# Patient Record
Sex: Female | Born: 1945 | Race: White | Hispanic: No | Smoking: Former smoker
Health system: Southern US, Community
[De-identification: ages and names within clinical notes are randomized; demographics above are authoritative.]

## PROBLEM LIST (undated history)

## (undated) DIAGNOSIS — M1990 Unspecified osteoarthritis, unspecified site: Secondary | ICD-10-CM

## (undated) DIAGNOSIS — Z972 Presence of dental prosthetic device (complete) (partial): Secondary | ICD-10-CM

## (undated) DIAGNOSIS — I499 Cardiac arrhythmia, unspecified: Secondary | ICD-10-CM

## (undated) DIAGNOSIS — K589 Irritable bowel syndrome without diarrhea: Secondary | ICD-10-CM

## (undated) DIAGNOSIS — K2950 Unspecified chronic gastritis without bleeding: Secondary | ICD-10-CM

## (undated) DIAGNOSIS — I739 Peripheral vascular disease, unspecified: Secondary | ICD-10-CM

## (undated) DIAGNOSIS — I679 Cerebrovascular disease, unspecified: Secondary | ICD-10-CM

## (undated) DIAGNOSIS — F419 Anxiety disorder, unspecified: Secondary | ICD-10-CM

## (undated) DIAGNOSIS — I10 Essential (primary) hypertension: Secondary | ICD-10-CM

## (undated) DIAGNOSIS — K08109 Complete loss of teeth, unspecified cause, unspecified class: Secondary | ICD-10-CM

## (undated) DIAGNOSIS — Z8679 Personal history of other diseases of the circulatory system: Secondary | ICD-10-CM

## (undated) DIAGNOSIS — K625 Hemorrhage of anus and rectum: Secondary | ICD-10-CM

## (undated) DIAGNOSIS — I700 Atherosclerosis of aorta: Secondary | ICD-10-CM

## (undated) DIAGNOSIS — R42 Dizziness and giddiness: Secondary | ICD-10-CM

## (undated) DIAGNOSIS — K642 Third degree hemorrhoids: Secondary | ICD-10-CM

## (undated) DIAGNOSIS — Z9289 Personal history of other medical treatment: Secondary | ICD-10-CM

## (undated) DIAGNOSIS — I708 Atherosclerosis of other arteries: Secondary | ICD-10-CM

## (undated) DIAGNOSIS — Z8719 Personal history of other diseases of the digestive system: Secondary | ICD-10-CM

## (undated) DIAGNOSIS — Z8673 Personal history of transient ischemic attack (TIA), and cerebral infarction without residual deficits: Secondary | ICD-10-CM

## (undated) DIAGNOSIS — K449 Diaphragmatic hernia without obstruction or gangrene: Secondary | ICD-10-CM

## (undated) DIAGNOSIS — R233 Spontaneous ecchymoses: Secondary | ICD-10-CM

## (undated) DIAGNOSIS — E785 Hyperlipidemia, unspecified: Secondary | ICD-10-CM

## (undated) DIAGNOSIS — K222 Esophageal obstruction: Secondary | ICD-10-CM

## (undated) DIAGNOSIS — K219 Gastro-esophageal reflux disease without esophagitis: Secondary | ICD-10-CM

## (undated) DIAGNOSIS — I6523 Occlusion and stenosis of bilateral carotid arteries: Secondary | ICD-10-CM

## (undated) DIAGNOSIS — Z973 Presence of spectacles and contact lenses: Secondary | ICD-10-CM

## (undated) DIAGNOSIS — E7800 Pure hypercholesterolemia, unspecified: Secondary | ICD-10-CM

## (undated) DIAGNOSIS — J189 Pneumonia, unspecified organism: Secondary | ICD-10-CM

## (undated) HISTORY — DX: Pure hypercholesterolemia, unspecified: E78.00

## (undated) HISTORY — DX: Essential (primary) hypertension: I10

## (undated) HISTORY — DX: Cardiac arrhythmia, unspecified: I49.9

## (undated) HISTORY — PX: TONSILLECTOMY: SUR1361

## (undated) HISTORY — DX: Spontaneous ecchymoses: R23.3

## (undated) HISTORY — DX: Unspecified chronic gastritis without bleeding: K29.50

## (undated) HISTORY — DX: Esophageal obstruction: K22.2

## (undated) HISTORY — DX: Irritable bowel syndrome, unspecified: K58.9

## (undated) HISTORY — DX: Anxiety disorder, unspecified: F41.9

## (undated) HISTORY — DX: Dizziness and giddiness: R42

## (undated) HISTORY — DX: Hyperlipidemia, unspecified: E78.5

## (undated) HISTORY — DX: Pneumonia, unspecified organism: J18.9

## (undated) HISTORY — PX: VASCULAR SURGERY: SHX849

## (undated) HISTORY — PX: CAROTID ENDARTERECTOMY: SUR193

## (undated) HISTORY — DX: Diaphragmatic hernia without obstruction or gangrene: K44.9

## (undated) HISTORY — PX: WRIST GANGLION EXCISION: SUR520

## (undated) HISTORY — DX: Gastro-esophageal reflux disease without esophagitis: K21.9

## (undated) HISTORY — PX: NASAL SINUS SURGERY: SHX719

---

## 2001-08-12 ENCOUNTER — Ambulatory Visit (HOSPITAL_COMMUNITY): Admission: RE | Admit: 2001-08-12 | Discharge: 2001-08-12 | Payer: Self-pay | Admitting: Gastroenterology

## 2001-08-12 ENCOUNTER — Encounter: Payer: Self-pay | Admitting: Gastroenterology

## 2004-06-18 ENCOUNTER — Ambulatory Visit: Payer: Self-pay | Admitting: Cardiovascular Disease

## 2004-06-18 ENCOUNTER — Ambulatory Visit: Payer: Self-pay

## 2004-07-04 ENCOUNTER — Ambulatory Visit: Payer: Self-pay | Admitting: Cardiovascular Disease

## 2004-11-26 ENCOUNTER — Ambulatory Visit: Payer: Self-pay

## 2004-12-08 ENCOUNTER — Ambulatory Visit: Payer: Self-pay | Admitting: Pulmonary Disease

## 2004-12-12 ENCOUNTER — Ambulatory Visit: Payer: Self-pay

## 2004-12-12 ENCOUNTER — Ambulatory Visit: Payer: Self-pay | Admitting: Cardiovascular Disease

## 2004-12-12 ENCOUNTER — Encounter: Payer: Self-pay | Admitting: Cardiovascular Disease

## 2006-01-21 ENCOUNTER — Ambulatory Visit: Payer: Self-pay | Admitting: Pulmonary Disease

## 2006-04-25 DIAGNOSIS — Z8673 Personal history of transient ischemic attack (TIA), and cerebral infarction without residual deficits: Secondary | ICD-10-CM

## 2006-04-25 HISTORY — DX: Personal history of transient ischemic attack (TIA), and cerebral infarction without residual deficits: Z86.73

## 2006-04-26 ENCOUNTER — Inpatient Hospital Stay (HOSPITAL_COMMUNITY): Admission: EM | Admit: 2006-04-26 | Discharge: 2006-04-30 | Payer: Self-pay | Admitting: Emergency Medicine

## 2006-04-26 ENCOUNTER — Encounter (INDEPENDENT_AMBULATORY_CARE_PROVIDER_SITE_OTHER): Payer: Self-pay | Admitting: *Deleted

## 2006-04-26 ENCOUNTER — Ambulatory Visit: Payer: Self-pay | Admitting: Internal Medicine

## 2006-04-26 ENCOUNTER — Encounter: Payer: Self-pay | Admitting: Internal Medicine

## 2006-04-27 ENCOUNTER — Ambulatory Visit: Payer: Self-pay | Admitting: Vascular Surgery

## 2006-04-28 ENCOUNTER — Encounter: Payer: Self-pay | Admitting: Cardiology

## 2006-04-29 ENCOUNTER — Encounter (INDEPENDENT_AMBULATORY_CARE_PROVIDER_SITE_OTHER): Payer: Self-pay | Admitting: Specialist

## 2006-05-26 ENCOUNTER — Ambulatory Visit: Payer: Self-pay | Admitting: Vascular Surgery

## 2006-05-27 ENCOUNTER — Ambulatory Visit: Payer: Self-pay | Admitting: Cardiovascular Disease

## 2006-06-03 ENCOUNTER — Ambulatory Visit: Payer: Self-pay | Admitting: Vascular Surgery

## 2006-06-16 ENCOUNTER — Ambulatory Visit: Payer: Self-pay | Admitting: Internal Medicine

## 2006-06-16 ENCOUNTER — Encounter: Payer: Self-pay | Admitting: Internal Medicine

## 2006-06-16 DIAGNOSIS — I10 Essential (primary) hypertension: Secondary | ICD-10-CM | POA: Insufficient documentation

## 2006-06-16 DIAGNOSIS — R233 Spontaneous ecchymoses: Secondary | ICD-10-CM

## 2006-06-16 DIAGNOSIS — E785 Hyperlipidemia, unspecified: Secondary | ICD-10-CM | POA: Insufficient documentation

## 2006-06-16 DIAGNOSIS — I679 Cerebrovascular disease, unspecified: Secondary | ICD-10-CM | POA: Insufficient documentation

## 2006-06-16 DIAGNOSIS — K219 Gastro-esophageal reflux disease without esophagitis: Secondary | ICD-10-CM | POA: Insufficient documentation

## 2006-06-16 HISTORY — DX: Spontaneous ecchymoses: R23.3

## 2006-06-16 LAB — CONVERTED CEMR LAB
ALT: 22 units/L (ref 0–40)
AST: 24 units/L (ref 0–37)
Albumin: 4.3 g/dL (ref 3.5–5.2)
Alkaline Phosphatase: 116 units/L (ref 39–117)
BUN: 7 mg/dL (ref 6–23)
Basophils Absolute: 0 10*3/uL (ref 0.0–0.1)
Basophils Relative: 0.7 % (ref 0.0–1.0)
Bilirubin, Direct: 0.1 mg/dL (ref 0.0–0.3)
CO2: 31 meq/L (ref 19–32)
Calcium: 9.6 mg/dL (ref 8.4–10.5)
Chloride: 106 meq/L (ref 96–112)
Cholesterol: 251 mg/dL (ref 0–200)
Creatinine, Ser: 0.7 mg/dL (ref 0.4–1.2)
Direct LDL: 154.2 mg/dL
Eosinophils Absolute: 0.1 10*3/uL (ref 0.0–0.6)
Eosinophils Relative: 1.6 % (ref 0.0–5.0)
GFR calc Af Amer: 109 mL/min
GFR calc non Af Amer: 90 mL/min
Glucose, Bld: 80 mg/dL (ref 70–99)
HCT: 43.5 % (ref 36.0–46.0)
HDL: 38.9 mg/dL — ABNORMAL LOW (ref >39.0)
Hemoglobin: 15 g/dL (ref 12.0–15.0)
Lymphocytes Relative: 30 % (ref 12.0–46.0)
MCHC: 34.4 g/dL (ref 30.0–36.0)
MCV: 88.5 fL (ref 78.0–100.0)
Monocytes Absolute: 0.5 10*3/uL (ref 0.2–0.7)
Monocytes Relative: 8.1 % (ref 3.0–11.0)
Neutro Abs: 3.5 10*3/uL (ref 1.4–7.7)
Neutrophils Relative %: 59.6 % (ref 43.0–77.0)
Platelets: 303 10*3/uL (ref 150–400)
Potassium: 4.1 meq/L (ref 3.5–5.1)
RBC: 4.92 M/uL (ref 3.87–5.11)
RDW: 12.5 % (ref 11.5–14.6)
Sodium: 143 meq/L (ref 135–145)
Total Bilirubin: 0.8 mg/dL (ref 0.3–1.2)
Total CHOL/HDL Ratio: 6.5
Total Protein: 7.6 g/dL (ref 6.0–8.3)
Triglycerides: 267 mg/dL (ref 0–149)
VLDL: 53 mg/dL — ABNORMAL HIGH (ref 0–40)
WBC: 5.8 10*3/uL (ref 4.5–10.5)

## 2006-06-17 ENCOUNTER — Ambulatory Visit: Payer: Self-pay

## 2006-09-23 ENCOUNTER — Telehealth: Payer: Self-pay | Admitting: Internal Medicine

## 2006-09-30 ENCOUNTER — Telehealth: Payer: Self-pay | Admitting: Internal Medicine

## 2006-10-12 ENCOUNTER — Ambulatory Visit: Payer: Self-pay | Admitting: Cardiovascular Disease

## 2006-10-12 LAB — CONVERTED CEMR LAB
AST: 15 units/L (ref 0–37)
Albumin: 4 g/dL (ref 3.5–5.2)
CO2: 30 meq/L (ref 19–32)
Chloride: 107 meq/L (ref 96–112)
Cholesterol: 235 mg/dL (ref 0–200)
Creatinine, Ser: 0.7 mg/dL (ref 0.4–1.2)
Direct LDL: 160.7 mg/dL
Glucose, Bld: 88 mg/dL (ref 70–99)
HDL: 40.7 mg/dL (ref >39.0)
Potassium: 3.9 meq/L (ref 3.5–5.1)
Sodium: 143 meq/L (ref 135–145)
Total Bilirubin: 0.7 mg/dL (ref 0.3–1.2)
Total Protein: 6.8 g/dL (ref 6.0–8.3)
VLDL: 42 mg/dL — ABNORMAL HIGH (ref 0–40)

## 2006-10-21 ENCOUNTER — Ambulatory Visit: Payer: Self-pay | Admitting: Internal Medicine

## 2006-11-30 ENCOUNTER — Ambulatory Visit: Payer: Self-pay | Admitting: Cardiovascular Disease

## 2006-12-07 ENCOUNTER — Ambulatory Visit: Payer: Self-pay

## 2006-12-14 ENCOUNTER — Ambulatory Visit: Payer: Self-pay | Admitting: Vascular Surgery

## 2007-02-02 ENCOUNTER — Ambulatory Visit: Payer: Self-pay | Admitting: Cardiovascular Disease

## 2007-02-02 LAB — CONVERTED CEMR LAB
ALT: 13 units/L (ref 0–35)
AST: 20 units/L (ref 0–37)
Alkaline Phosphatase: 77 units/L (ref 39–117)
Total CHOL/HDL Ratio: 5.4
Total Protein: 7 g/dL (ref 6.0–8.3)

## 2007-03-01 ENCOUNTER — Ambulatory Visit: Payer: Self-pay | Admitting: Vascular Surgery

## 2007-03-30 ENCOUNTER — Ambulatory Visit: Payer: Self-pay | Admitting: Internal Medicine

## 2007-03-30 LAB — CONVERTED CEMR LAB
ALT: 16 units/L (ref 0–35)
Albumin: 4 g/dL (ref 3.5–5.2)
Alkaline Phosphatase: 75 units/L (ref 39–117)
BUN: 11 mg/dL (ref 6–23)
Calcium: 10.1 mg/dL (ref 8.4–10.5)
GFR calc Af Amer: 81 mL/min
HDL: 42.2 mg/dL (ref >39.0)
Potassium: 5.2 meq/L — ABNORMAL HIGH (ref 3.5–5.1)
Triglycerides: 345 mg/dL (ref 0–149)
VLDL: 69 mg/dL — ABNORMAL HIGH (ref 0–40)

## 2007-04-22 ENCOUNTER — Ambulatory Visit: Payer: Self-pay | Admitting: Internal Medicine

## 2007-06-10 ENCOUNTER — Ambulatory Visit: Payer: Self-pay | Admitting: Internal Medicine

## 2007-06-10 ENCOUNTER — Telehealth: Payer: Self-pay | Admitting: Internal Medicine

## 2007-06-10 LAB — CONVERTED CEMR LAB
Albumin: 4 g/dL (ref 3.5–5.2)
Cholesterol: 221 mg/dL (ref 0–200)
Direct LDL: 153.8 mg/dL
HDL: 33.6 mg/dL — ABNORMAL LOW (ref >39.0)
Total CHOL/HDL Ratio: 6.6
Triglycerides: 129 mg/dL (ref 0–149)
VLDL: 26 mg/dL (ref 0–40)

## 2007-07-21 ENCOUNTER — Ambulatory Visit: Payer: Self-pay | Admitting: Cardiovascular Disease

## 2007-07-21 ENCOUNTER — Ambulatory Visit: Payer: Self-pay

## 2007-07-26 ENCOUNTER — Ambulatory Visit: Payer: Self-pay | Admitting: Vascular Surgery

## 2007-08-17 ENCOUNTER — Ambulatory Visit: Payer: Self-pay | Admitting: Vascular Surgery

## 2007-08-17 ENCOUNTER — Encounter: Payer: Self-pay | Admitting: Vascular Surgery

## 2007-08-17 ENCOUNTER — Inpatient Hospital Stay (HOSPITAL_COMMUNITY): Admission: RE | Admit: 2007-08-17 | Discharge: 2007-08-18 | Payer: Self-pay | Admitting: Vascular Surgery

## 2007-09-06 ENCOUNTER — Telehealth: Payer: Self-pay | Admitting: Internal Medicine

## 2007-09-06 ENCOUNTER — Ambulatory Visit: Payer: Self-pay | Admitting: Vascular Surgery

## 2008-01-24 ENCOUNTER — Ambulatory Visit: Payer: Self-pay | Admitting: Cardiovascular Disease

## 2008-02-14 ENCOUNTER — Ambulatory Visit: Payer: Self-pay

## 2008-02-14 LAB — CONVERTED CEMR LAB
ALT: 14 units/L (ref 0–35)
Albumin: 3.7 g/dL (ref 3.5–5.2)
Bilirubin, Direct: 0.1 mg/dL (ref 0.0–0.3)
Cholesterol: 279 mg/dL (ref 0–200)
Direct LDL: 203.4 mg/dL
HDL: 37.4 mg/dL — ABNORMAL LOW (ref >39.0)
Total Protein: 6.7 g/dL (ref 6.0–8.3)
Triglycerides: 236 mg/dL (ref 0–149)

## 2008-04-11 ENCOUNTER — Ambulatory Visit: Payer: Self-pay | Admitting: Cardiovascular Disease

## 2008-04-11 LAB — CONVERTED CEMR LAB
AST: 19 units/L (ref 0–37)
Albumin: 3.8 g/dL (ref 3.5–5.2)
Alkaline Phosphatase: 71 units/L (ref 39–117)
Cholesterol: 180 mg/dL (ref 0–200)
Total CHOL/HDL Ratio: 4

## 2008-07-03 ENCOUNTER — Telehealth: Payer: Self-pay | Admitting: Cardiovascular Disease

## 2008-08-24 ENCOUNTER — Telehealth: Payer: Self-pay | Admitting: Cardiovascular Disease

## 2008-10-10 ENCOUNTER — Telehealth: Payer: Self-pay | Admitting: Cardiovascular Disease

## 2008-11-16 DIAGNOSIS — M1990 Unspecified osteoarthritis, unspecified site: Secondary | ICD-10-CM | POA: Insufficient documentation

## 2008-11-16 DIAGNOSIS — E7800 Pure hypercholesterolemia, unspecified: Secondary | ICD-10-CM | POA: Insufficient documentation

## 2008-11-19 ENCOUNTER — Telehealth: Payer: Self-pay | Admitting: Cardiovascular Disease

## 2008-11-20 ENCOUNTER — Encounter (INDEPENDENT_AMBULATORY_CARE_PROVIDER_SITE_OTHER): Payer: Self-pay | Admitting: *Deleted

## 2008-11-20 ENCOUNTER — Ambulatory Visit: Payer: Self-pay | Admitting: Cardiovascular Disease

## 2008-11-20 LAB — CONVERTED CEMR LAB
Direct LDL: 109 mg/dL
HDL: 42.5 mg/dL (ref >39.00)

## 2008-11-21 ENCOUNTER — Ambulatory Visit: Payer: Self-pay

## 2008-11-21 ENCOUNTER — Ambulatory Visit: Payer: Self-pay | Admitting: Cardiovascular Disease

## 2008-11-21 DIAGNOSIS — R0989 Other specified symptoms and signs involving the circulatory and respiratory systems: Secondary | ICD-10-CM | POA: Insufficient documentation

## 2008-12-24 ENCOUNTER — Telehealth: Payer: Self-pay | Admitting: Cardiovascular Disease

## 2009-03-27 ENCOUNTER — Encounter (INDEPENDENT_AMBULATORY_CARE_PROVIDER_SITE_OTHER): Payer: Self-pay | Admitting: *Deleted

## 2009-06-24 ENCOUNTER — Emergency Department (HOSPITAL_COMMUNITY): Admission: EM | Admit: 2009-06-24 | Discharge: 2009-06-24 | Payer: Self-pay | Admitting: Emergency Medicine

## 2009-07-15 ENCOUNTER — Encounter (INDEPENDENT_AMBULATORY_CARE_PROVIDER_SITE_OTHER): Payer: Self-pay | Admitting: *Deleted

## 2009-08-09 ENCOUNTER — Encounter: Payer: Self-pay | Admitting: Cardiovascular Disease

## 2009-08-09 ENCOUNTER — Ambulatory Visit: Payer: Self-pay

## 2009-08-15 ENCOUNTER — Telehealth: Payer: Self-pay | Admitting: Cardiovascular Disease

## 2009-08-23 ENCOUNTER — Ambulatory Visit: Payer: Self-pay | Admitting: Cardiovascular Disease

## 2009-08-23 ENCOUNTER — Telehealth: Payer: Self-pay | Admitting: Cardiovascular Disease

## 2009-11-25 ENCOUNTER — Encounter (INDEPENDENT_AMBULATORY_CARE_PROVIDER_SITE_OTHER): Payer: Self-pay | Admitting: *Deleted

## 2010-02-14 ENCOUNTER — Encounter: Payer: Self-pay | Admitting: Cardiovascular Disease

## 2010-02-18 ENCOUNTER — Ambulatory Visit: Admit: 2010-02-18 | Payer: Self-pay

## 2010-02-23 LAB — CONVERTED CEMR LAB
AST: 19 units/L (ref 0–37)
Albumin: 4.3 g/dL (ref 3.5–5.2)
Alkaline Phosphatase: 68 units/L (ref 39–117)
Calcium: 9.4 mg/dL (ref 8.4–10.5)
Cholesterol: 260 mg/dL — ABNORMAL HIGH (ref 0–200)
Creatinine, Ser: 0.9 mg/dL (ref 0.4–1.2)
Direct LDL: 155.9 mg/dL
GFR calc non Af Amer: 71.25 mL/min (ref >60)
Total Protein: 7 g/dL (ref 6.0–8.3)
VLDL: 72.6 mg/dL — ABNORMAL HIGH (ref 0.0–40.0)

## 2010-02-25 NOTE — Letter (Signed)
Summary: Custom - Lipid  [facility], Main Office  [address]   [place], [place] [address]   Phone: [phone]  Fax: [phone]     November 20, 2008 MRN: [id number]   [name] [name] [address] [place], [place]  [address]   Dear Ms. Droege,  We have reviewed your cholesterol results.  They are as follows:     Total Cholesterol:    186 (Desirable: less than 200)       HDL  Cholesterol:     42.50  (Desirable: greater than 40 for men and 50 for women)       LDL Cholesterol:       109  (Desirable: less than 100 for low risk and less than 70 for moderate to high risk)       Triglycerides:       242.0  (Desirable: less than 150)  Our recommendations include:These numbers look better [name] [name]. Watch the sugars and starch alittle closer, that will help with the triglycerides. Call with any questions. Dr Leanora Cover   Call our office at the number listed above if you have any questions.  Lowering your LDL cholesterol is important, but it is only one of a large number of "risk factors" that may indicate that you are at risk for heart disease, stroke or other complications of hardening of the arteries.  Other risk factors include:   A.  Cigarette Smoking* B.  High Blood Pressure* C.  Obesity* D.   Low HDL Cholesterol (see yours above)* E.   Diabetes Mellitus (higher risk if your is uncontrolled) F.  Family history of premature heart disease G.  Previous history of stroke or cardiovascular disease    *These are risk factors YOU HAVE CONTROL OVER.  For more information, visit .  There is now evidence that lowering the TOTAL CHOLESTEROL AND LDL CHOLESTEROL can reduce the risk of heart disease.  The American Heart Association recommends the following guidelines for the treatment of elevated cholesterol:  1.  If there is now current heart disease and less than two risk factors, TOTAL CHOLESTEROL should be less than 200 and LDL CHOLESTEROL should be less than 100. 2.   If there is current heart disease or two or more risk factors, TOTAL CHOLESTEROL should be less than 200 and LDL CHOLESTEROL should be less than 70.  A diet low in cholesterol, saturated fat, and calories is the cornerstone of treatment for elevated cholesterol.  Cessation of smoking and exercise are also important in the management of elevated cholesterol and preventing vascular disease.  Studies have shown that 30 to 60 minutes of physical activity most days can help lower blood pressure, lower cholesterol, and keep your weight at a healthy level.  Drug therapy is used when cholesterol levels do not respond to therapeutic lifestyle changes (smoking cessation, diet, and exercise) and remains unacceptably high.  If medication is started, it is important to have you levels checked periodically to evaluate the need for further treatment options.  Thank you,    [facility] Team

## 2010-02-25 NOTE — Progress Notes (Signed)
Summary: med refills  Phone Note Outgoing Call   Call placed by: Katina Dung, RN, BSN,  August 23, 2009 9:05 AM Call placed to: pharmacy Summary of Call: med refills  Follow-up for Phone Call        med into pharmacy for pt    New/Updated Medications: PLAVIX 75 MG  TABS (CLOPIDOGREL BISULFATE) Take 1 tablet by mouth once a day LISINOPRIL 10 MG TABS (LISINOPRIL) Take 1 tablet by mouth once a day TOPROL XL 50 MG XR24H-TAB (METOPROLOL SUCCINATE) Take 1 tablet by mouth once a day Prescriptions: TOPROL XL 50 MG XR24H-TAB (METOPROLOL SUCCINATE) Take 1 tablet by mouth once a day  #90 x 3   Entered by:   Katina Dung, RN, BSN   Authorized by:   Colon Branch, MD, [facility]   Signed by:   Katina Dung, RN, BSN on 08/23/2009   Method used:   Electronically to        [facility] [phone]* (retail)       [address]       [place], [place]  [address]       Ph: [phone]       Fax: [phone]   RxID:   [id number] LISINOPRIL 10 MG TABS (LISINOPRIL) Take 1 tablet by mouth once a day  #90 x 3   Entered by:   Katina Dung, RN, BSN   Authorized by:   Colon Branch, MD, [facility]   Signed by:   Katina Dung, RN, BSN on 08/23/2009   Method used:   Electronically to        [facility] [phone]* (retail)       [address]       [place], [place]  [address]       Ph: [phone]       Fax: [phone]   RxID:   [id number] PLAVIX 75 MG  TABS (CLOPIDOGREL BISULFATE) Take 1 tablet by mouth once a day  #90 x 3   Entered by:   Katina Dung, RN, BSN   Authorized by:   Colon Branch, MD, [facility]   Signed by:   Katina Dung, RN, BSN on 08/23/2009   Method used:   Electronically to        [facility] [phone]* (retail)       [address]       [place], [place]  [address]       Ph: [phone]       Fax: [phone]   RxID:   [id number] CRESTOR 20 MG TABS (ROSUVASTATIN CALCIUM) one by mouth once daily  #90 x 3   Entered by:   Katina Dung, RN,  BSN   Authorized by:   Colon Branch, MD, [facility]   Signed by:   Katina Dung, RN, BSN on 08/23/2009   Method used:   Electronically to        [facility] [phone]* (retail)       [address]       [place], [place]  [address]       Ph: [phone]       Fax: [phone]   RxID:   [id number]

## 2010-02-25 NOTE — Letter (Signed)
Summary: Appointment - Reminder 2  [facility], Main Office  [address]   [place], [place] [address]   Phone: [phone]  Fax: [phone]     March 27, 2009 MRN: [id number]   [name] [name] [address] [place], [place]  [address]   Dear [name],  Our records indicate that it is time to schedule a follow-up appointment with Dr. Eden Emms. It is very important that we reach you to schedule this appointment. We look forward to participating in your health care needs. Please contact us at the number listed above at your earliest convenience to schedule your appointment.  If you are unable to make an appointment at this time, give [place] a call so we can update our records.     Sincerely,   Migdalia Dk [facility] Scheduling Team

## 2010-02-25 NOTE — Miscellaneous (Signed)
Summary: Orders Update  Clinical Lists Changes  Orders: Added new Test order of Carotid Duplex (Carotid Duplex) - Signed 

## 2010-02-25 NOTE — Progress Notes (Signed)
Summary: pt returning call  Phone Note Call from Patient   Caller: Patient Reason for Call: Talk to Nurse Summary of Call: pt returning christine's call-pls call [phone] Initial call taken by: Glynda Jaeger,  August 15, 2009 10:16 AM  Follow-up for Phone Call        pt aware of results Dennis Bast, RN, BSN  August 15, 2009 12:03 PM

## 2010-02-25 NOTE — Assessment & Plan Note (Signed)
Summary: f1y/dm   Visit Type:  Follow-up Primary Provider:  Birdie Sons MD  CC:  pt has a few things she wants to discuss with Dr Eden Emms.  History of Present Illness: [name] [name] is seen today for F/U of vascular disease.  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  I reveiwed her doppler with her from today.  She had 60-79% righth ICA stenosis and 40-59% left post CEA. Duplex 7/11 She has stopped smoking.  her Bp has been borderline. She will monitor at home and we may have to increase her Lisinopril to 20mg .  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  She does not have CAD and had a normal myovue 06/17/2006.  Her LDL is 108 and she needs to have less fat in her diet which we discussed today  She will have lab work today.  She may retire from the [employer] syst[employer] and wants a 90 day supply of her meds  Current Problems (verified): 1)  Carotid Bruit  (ICD-785.9) 2)  Hypertension  (ICD-401.9) 3)  Hyperlipidemia  (ICD-272.4) 4)  Hypercholesterolemia  (ICD-272.0) 5)  Symptom, Ecchymoses, Spontaneous  (ICD-782.7) 6)  Disease, Cerebrovascular Nos  (ICD-437.9) 7)  Family History Diabetes 1st Degree Relative  (ICD-V18.0) 8)  Gerd  (ICD-530.81) 9)  Osteoarthritis  (ICD-715.90)  Current Medications (verified): 1)  Prilosec Otc 20 Mg  Tbec (Omeprazole Magnesium) .... Once Daily 2)  Crestor 20 Mg Tabs (Rosuvastatin Calcium) .... One By Mouth Once Daily 3)  Plavix 75 Mg  Tabs (Clopidogrel Bisulfate) .... Take 1 Tablet By Mouth Once A Day--Needs Office Visit 4)  Aspirin 325 Mg  Tbec (Aspirin) .... Take 1 Tablet By Mouth Once A Day 5)  Trazodone Hcl 50 Mg Tabs (Trazodone Hcl) .... 1/2-1 By Mouth At Bedtime As Needed Insomnia 6)  Lisinopril 10 Mg Tabs (Lisinopril) .... Take 1 Tablet By Mouth Once A Day 7)  Toprol Xl 50 Mg Xr24h-Tab (Metoprolol Succinate) .... Take 1 Tablet By Mouth Once A Day 8)  Wellbutrin Xl 300 Mg Xr24h-Tab (Bupropion Hcl) .... Take 1 Tablet By Mouth Once A Day  Allergies  (verified): 1)  ! Sulfadiazine (Sulfadiazine) 2)  Sulfamethoxazole (Sulfamethoxazole) 3)  Sulfamethoxazole (Sulfamethoxazole) 4)  Sulfamethoxazole (Sulfamethoxazole)  Past History:  Past Medical History: Last updated: 11/20/2008 Current Problems:  HYPERTENSION (ICD-401.9) HYPERLIPIDEMIA (ICD-272.4) HYPERCHOLESTEROLEMIA (ICD-272.0) SYMPTOM, ECCHYMOSES, SPONTANEOUS (ICD-782.7) DISEASE, CEREBROVASCULAR NOS (ICD-437.9) FAMILY HISTORY DIABETES 1ST DEGREE RELATIVE (ICD-V18.0) GERD (ICD-530.81) OSTEOARTHRITIS (ICD-715.90) TIA  Past Surgical History: Last updated: 11/16/2008 Bilateral Carotid Endarderectomy Tonsillectomy  Family History: Last updated: 06/16/2006 fatherdeceased strokes? 61 yo mother deceased 86 hx of DM, asthma, complications of DM Family History Diabetes 1st degree relative-mother 7 sibs-brother deceased with vascular disease, 3 others deceased unknown cause (MVA)  Social History: Last updated: 10/21/2006 Occupation: works for [employer] Married---recent widow Former Smoker Alcohol use-no  Review of Systems       Denies fever, malais, weight loss, blurry vision, decreased visual acuity, cough, sputum, SOB, hemoptysis, pleuritic pain, palpitaitons, heartburn, abdominal pain, melena, lower extremity edema, claudication, or rash.   Vital Signs:  Patient profile:   65 year old female Height:      62 inches Weight:      154 pounds BMI:     28.27 Pulse rate:   69 / minute BP sitting:   149 / 80  (left arm) Cuff size:   regular  Vitals Entered By: Burnett Kanaris, CNA (August 23, 2009 8:35 AM)  Physical Exam  General:  Affect appropriate Healthy:  appears stated age HEENT: normal Neck supple with no adenopathy JVP normal right  bruits no thyromegaly Lungs clear with no wheezing and good diaphragmatic motion Heart:  S1/S2 no murmur,rub, gallop or click PMI normal Abdomen: benighn, BS positve, no tenderness, no AAA no bruit.  No HSM or  HJR Distal pulses intact with no bruits No edema Neuro non-focal Skin warm and dry    Impression & Recommendations:  Problem # 1:  CAROTID BRUIT (ICD-785.9) F/U duplex in 6 months  NO TIA like symptoms  Problem # 2:  HYPERTENSION (ICD-401.9) Well controlled continue low sodium diet Her updated medication list for this problem includes:    Aspirin 325 Mg Tbec (Aspirin) .Marland Kitchen... Take 1 tablet by mouth once a day    Lisinopril 10 Mg Tabs (Lisinopril) .Marland Kitchen... Take 1 tablet by mouth once a day    Toprol Xl 50 Mg Xr24h-tab (Metoprolol succinate) .Marland Kitchen... Take 1 tablet by mouth once a day  Problem # 3:  HYPERLIPIDEMIA (ICD-272.4) Lipid and liver today Her updated medication list for this problem includes:    Crestor 20 Mg Tabs (Rosuvastatin calcium) ..... One by mouth once daily  Orders: TLB-Lipid Panel (80061-LIPID) TLB-Hepatic/Liver Function Pnl (80076-HEPATIC) TLB-BMP (Basic Metabolic Panel-BMET) (80048-METABOL)  CHOL: 186 (11/20/2008)   LDL: DEL (02/14/2008)   HDL: 42.50 (11/20/2008)   TG: 242.0 (11/20/2008)  Patient Instructions: 1)  Your physician recommends that you have  a FASTING lipid profile/liver profile/BMP  401.9  272.0  2)  Your physician wants you to follow-up in: 6 months with Dr Eden Emms. You will receive a reminder letter in the mail two months in advance. If you don't receive a letter, please call our office to schedule the follow-up appointment.   EKG Report  Procedure date:  08/23/2009  Findings:      NSR 69 Normal ECG

## 2010-02-25 NOTE — Miscellaneous (Signed)
  Clinical Lists Changes  Medications: Changed medication from [facility] XL 300 MG XR24H-TAB (BUPROPION HCL) Take 1 tablet by mouth once a day to [facility] XL 300 MG XR24H-TAB (BUPROPION HCL) Take 1 tablet by mouth once a day - Signed Rx of WELLBUTRIN XL 300 MG XR24H-TAB (BUPROPION HCL) Take 1 tablet by mouth once a day;  #30 x 12;  Signed;  Entered by: Deliah Goody, RN;  Authorized by: Colon Branch, MD, [facility];  Method used: Electronically to [facility] [phone]*, [address] [place], [place]  [address], Ph: [phone], Fax: [phone] Rx of TRAZODONE HCL 50 MG TABS (TRAZODONE HCL) 1/2-1 by mouth at bedtime as needed insomnia;  #90 x 6;  Signed;  Entered by: Deliah Goody, RN;  Authorized by: Colon Branch, MD, [facility];  Method used: Electronically to [facility] [phone]*, [address], [place], [place]  [address], Ph: [phone], Fax: [phone]    Prescriptions: TRAZODONE HCL 50 MG TABS (TRAZODONE HCL) 1/2-1 by mouth at bedtime as needed insomnia  #90 x 6   Entered by:   Deliah Goody, RN   Authorized by:   Colon Branch, MD, [facility]   Signed by:   Deliah Goody, RN on 11/25/2009   Method used:   Electronically to        [facility] [phone]* (retail)       [address]       [place], [place]  [address]       Ph: [phone]       Fax: [phone]   RxID:   [id number] WELLBUTRIN XL 300 MG XR24H-TAB (BUPROPION HCL) Take 1 tablet by mouth once a day  #30 x 12   Entered by:   Deliah Goody, RN   Authorized by:   Colon Branch, MD, [facility]   Signed by:   Deliah Goody, RN on 11/25/2009   Method used:   Electronically to        [facility] [phone]* (retail)       [address]       [place], [place]  [address]       Ph: [phone]       Fax: [phone]   RxID:   [id number]

## 2010-02-27 NOTE — Miscellaneous (Signed)
Summary: Orders Update  Clinical Lists Changes  Orders: Added new Test order of Carotid Duplex (Carotid Duplex) - Signed 

## 2010-05-13 ENCOUNTER — Ambulatory Visit: Payer: Self-pay | Admitting: Physical Therapy

## 2010-06-10 NOTE — Assessment & Plan Note (Signed)
[facility]                            CARDIOLOGY OFFICE NOTE   NAME:DILLONRudine, Rieger                        MRN:          [id number]  DATE:07/21/2007                            DOB:          Jan 05, 1946    HISTORY:  Tenishia returns today for followup.  She is a 65 years old.  She  has had advanced carotid disease.  She had a carotid endarterectomy by  Dr. Cari Caraway on the left side.   I had her come back today for her followup carotid duplex.  She  previously had had a 60-79% lesion on the right.  This has progressed to  markedly.  Her proximal ICA velocity is not 4 m/sec with her diastolic  velocity at 1.1 cm/sec.   I talked to [place] at length and explained to her that she had surgical  disease on the right-hand side now.  We will have her see Dr. Cari Caraway as soon as possible for referral for CEA on the right side.  I  also explained to [place] that her continued smoking has caused some  disease to recur on the left side.  Her peak velocity in the mid ICA in  the left is now 2.2 cm/sec with a diastolic velocity of 62 cm/sec.   She has not had any TIA-like symptoms, headaches, or visual changes.  She is on aspirin and Plavix due to her advanced carotid disease.  Her  Plavix will have to be held probably 5 days before her surgery.   She has hypercholesterolemia.  She has been intolerant to Zetia and  Lipitor in the past.  Her lipids were actually worse on Crestor.  She is  now on simvastatin 80 a day.  Dr. Cato Mulligan has been following her lipid  profile.  Her last LDL still 135.   Her LFTs have been normal.  At some point, we may need to add Niaspan or  red yeast rice or something else to get her LDL under 100.  I talked to  her at length about smoking cessation.  She has some program that she  would like to be involved with which is multidisciplinary, however, for  the time being I told her I would start her on Wellbutrin.  She is a  good candidate  for this as she tends to have some depression.   REVIEW OF SYSTEMS:  Otherwise negative.  In particular, she has no  documented coronary disease.  She is not having any significant chest  pain.  Her last Myoview on 06/17/2006, was nonischemic and I do not  think she needs any other risk stratification.  Preop EF on 06/17/2006,  was 69%.   PHYSICAL EXAMINATION:  GENERAL:  Remarkable for a charismatic white  female in no distress.  VITAL SIGNS:  Blood pressure is 130/80, pulse is 80 and regular,  respiratory 14, and afebrile.  HEENT:  Unremarkable.  NECK:  Bilateral carotid bruits with previous left CEA.  No JVP  elevation, lymphadenopathy, or thyromegaly.  LUNGS:  Clear diaphragmatic motion.  No wheezing.  S1-S2 normal heart  sounds.  PMI normal.  ABDOMEN:  Benign.  Bowel sounds positive.  No AAA.  No tenderness.  No  bruit.  No hepatosplenomegaly or hepatojugular reflux.  EXTREMITIES:  Pulse intact.  No edema.  NEUROLOGIC:  Nonfocal.  SKIN:  Warm and dry.  No muscular weakness.   EKG normal.   IMPRESSION:  1. Carotid disease, critical on the right-hand side.  Referred to Dr.      [name] for carotid endarterectomy.  Continue aspirin.  Hold Plavix      5 days before surgery.  2. Hypercholesterolemia.  Continue high-dose simvastatin.  Consider      adding Niaspan, Welchol, or red yeast rice postoperatively.  3. Smoking cessation.  Prescription for Wellbutrin given.  Link      between her carotid disease and smoking done over in depth.  4. Hypertension.  Continue metoprolol, may need to add ACE inhibitor      in the future, low-salt diet.  5. I will see Jacie in 2-3 weeks after her carotid endarterectomy.   We spent quite a bit of time talking to her today in regards to the  progression of her carotid disease, the need to take her smoking  seriously, to prevent further vascular disease, and also the decision  not to proceed with any further stratification since the carotid   endarterectomy is a relatively low-risk surgery and her Myoview a year  ago or so was normal.     Theron Arista C. Eden Emms, MD, [facility]  Electronically Signed    PCN/MedQ  DD: 07/21/2007  DT: 07/22/2007  Job #: [id number]

## 2010-06-10 NOTE — H&P (Signed)
HISTORY AND PHYSICAL EXAMINATION   July 26, 2007   Re:  [name] [name], [name] [name]                 DOB:  18-Jul-1945   REASON FOR ADMISSION:  Greater than 80% right carotid stenosis.   DATE OF PLANNED ADMISSION:  08/17/2007.   The patient is a 65 year old right-handed woman who underwent a left  carotid endarterectomy with Dacron patch angioplasty in April of 2008  for a symptomatic left carotid stenosis.  She had been having transient  episodes of right upper extremity and right lower extremity paresthesias  and weakness.  She was having a moderate right carotid stenosis followed  in the [place] office and on their most recent carotid duplex scan, the  stenosis had progressed to greater than 80%.  She was sent for vascular  consultation for possible right carotid endarterectomy.  Of note, she  has had no recent symptoms of stroke, TIA, expressive or receptive  aphasia, or amaurosis fugax.  She is on both aspirin and Plavix.   PAST MEDICAL HISTORY:  Significant for hypertension and  hypercholesterolemia.  She denies any history of diabetes, history of  previous myocardial infarction, history of congestive heart failure, or  history of COPD.   PAST SURGICAL HISTORY:  Significant for sinus surgery in the past.   ALLERGIES:  Sulfa.   MEDICATIONS:  1. Aspirin 325 mg p.o. daily.  2. Plavix 75 mg p.o. daily.  This was to be to discontinued 1 week      preoperatively.  3. Prilosec OTC 20 mg p.o. daily.  4. Simvastatin 80 mg p.o. nightly.  5. Metoprolol ER 50 mg p.o. daily.   REVIEW OF SYSTEMS:  GENERAL:  She has had no recent weight loss, weight  gain or problems with her appetite.  CARDIAC:  She has had no chest pain, chest pressure, palpitations or  arrhythmias.  PULMONARY:  She has had no recent productive cough, bronchitis, asthma  or wheezing.  GI:  She has had reflux in the past.  She has had no recent change in  her bowel habits and has no history of peptic ulcer  disease.  GU: She has had no dysuria or frequency.  VASCULAR:  She has had no claudication rest pain or nonhealing ulcers.  She had no history of DVT or phlebitis.  NEURO:  She had no dizziness, blackouts, headaches or seizures.  ORTHO:  She does have some occasional arthritic pain and joint pain.  PSYCHIATRIC:  She has had no depression or nervousness.  HEMATOLOGIC:  She had no bleeding problems or clotting disorders.   PHYSICAL EXAMINATION:  This is a pleasant 62 normal who appears her  stated age.  Blood pressure is 171/87, heart rate is 82.  Neck is  supple.  There is no cervical lymphadenopathy.  She has a right carotid  bruit.  Lungs are clear bilaterally to auscultation.  Cardiac exam, she  has a regular rate and rhythm.  Abdomen is soft and nontender.  She has  normal-pitched bowel sounds.  She has palpable femoral pulses and warm  well-perfused feet without ischemic ulcers.  Neurologic exam is  nonfocal.   Her carotid duplex scan shows a widely patent left carotid  endarterectomy site.  On the right side, she has a greater than 80%  right carotid stenosis.   I have recommended right carotid endarterectomy in order to lower her  risk of future stroke.  We have discussed the procedure and potential  complications including but not limited to bleeding, wound healing  problems, stroke (periprocedural risk 1-2%), nerve injury, or other  unpredictable medical problems.  All of her questions were answered and  she is agreeable to proceed.  We also discussed the importance of  tobacco cessation although she has been under some stress as this is  approximately the 1-year anniversary of her husband's death.  With  respect to her Plavix, we will stop this 1 week preoperatively and she  will continue her aspirin perioperatively.  Her surgery has been  tentatively scheduled for 08/17/2007.   [name]. [name], M.D.  Electronically Signed   CSD/MEDQ  D:  07/26/2007  T:   07/27/2007  Job:  [id number]   cc:   Noralyn Pick. Eden Emms, MD, [facility]  Scott M. Kriste Basque, MD

## 2010-06-10 NOTE — Assessment & Plan Note (Signed)
OFFICE VISIT   [name] [name], [name] [name]  DOB:  1945/12/17                                       09/06/2007  CHART#:[id number]   I saw the patient in the office today for followup after her recent  right carotid endarterectomy.  This is a pleasant 65 year old woman who  had undergone previous left carotid endarterectomy for symptomatic left  carotid stenosis.  I have been following a moderate right carotid  stenosis which progressed to greater than 80%.  She underwent right  carotid endarterectomy with Dacron patch angioplasty on 08/17/2007.  She  did well postoperatively and was discharged on postoperative day #1.  She returns for her first followup visit.   Overall she has been doing quite well.  Her pain is well-controlled.  She has had no focal weakness or paresthesias.  She is back on her  aspirin and Plavix.   PHYSICAL EXAMINATION:  Blood pressure is 187/86.  Her neck incision is  healing nicely.  Lungs are clear bilaterally to auscultation.  Neurologic exam is nonfocal.   Overall I am pleased with her progress.  We will see her back in 6  months for followup duplex scan.  She knows to call sooner if she has  problems.  In the meantime she knows to remain on her aspirin.  If her  blood pressure does become more difficult to control in the future we  could arrange for a renal artery duplex.   [name] [name], M.D.  Electronically Signed   CSD/MEDQ  D:  09/06/2007  T:  09/07/2007  Job:  [id number]   cc:   Noralyn Pick. Eden Emms, MD, [facility]  Scott M. Kriste Basque, MD

## 2010-06-10 NOTE — Assessment & Plan Note (Signed)
OFFICE VISIT   FLANNERY, CAVALLERO  DOB:  Jan 09, 1946                                       03/01/2007  CHART#:[id number]   I saw patient in the office today for continued followup of her carotid  disease.  I had performed a left carotid endarterectomy in 04/08 for a  symptomatic left carotid stenosis.  Somehow she missed her six month  follow-up visit; however, she had a carotid duplex scan at the [place]  office on 12/07/06.  This showed bilateral carotid disease, and she  comes today for followup to discuss these results.  Of note, she has had  no history of stroke, TIAs, expressive or receptive aphasia or amaurosis  fugax since her surgery.  Unfortunately, she lost her husband since I  saw her last.   On review of systems, she has had no recent chest pain, chest pressure,  palpitations, or arrhythmias.  She has had no bronchitis, asthma, or  wheezing.   PHYSICAL EXAMINATION:  This is a pleasant 65 year old woman who appears  her stated age.  Blood pressure is 120/70.  Heart rate is 70.  She has  bilateral carotid bruits.  Lungs are clear bilaterally to auscultation.  On cardiac exam, she has a regular rate and rhythm.  Her abdomen is soft  and nontender.  Neurologic exam is nonfocal.   Carotid duplex scan from the [place] office shows evidence of a 60-79%  right carotid stenosis.  By velocity criteria, she has a 40-59%  recurrent carotid stenosis on the left.  I have explained that we  generally do not consider a carotid endarterectomy on the right unless  the stenosis progressed to greater than 80%, as she is asymptomatic.  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  I will see her back in six months with a follow-up duplex scan.  She knows to call sooner if she has problems.  In the meantime, she  knows to continue taking her aspirin.   [name]. [name], M.D.  Electronically Signed   CSD/MEDQ  D:  03/01/2007  T:  03/03/2007  Job:  [id number]

## 2010-06-10 NOTE — Assessment & Plan Note (Signed)
[facility]                            CARDIOLOGY OFFICE NOTE   NAME:DILLONMackinsey, Pelland                       MRN:          [id number]  DATE:11/30/2006                            DOB:          January 18, 1946    Dorotea returns today for followup. She has been through a lot lately. In  April, she had two TIAs and subsequently had a left CEA by Dr. [name].  Since that time, her husband died in 08-02-22. He apparently had a ruptured  gallbladder and sepsis. Jissell has been doing fairly well. All of her  neurological deficits have resolved. Unfortunately, she started smoking  again in 08-02-22. We had a long discussion about this, but she said she  really was not motivated to quit after her husbands death.   From a cardiac perspective, she has been somewhat hypertensive and  tachycardic. I told her that we would stop her Norvasc and start her on  Toprol which would be a better antihypertensive particularly since Anwyn  tends to be hyperandrogenic.   The patient also has had Zetia started about three weeks ago for  hypercholesterolemia. She had myalgias on Lipitor and was switched to  simvastatin. She is a little bit more cognizant about the generic drug  costs now due to her husband's death and change in insurance coverage.   REVIEW OF SYSTEMS:  Otherwise, negative.   Meds:  Reviewed and documented in chart.  Note above changes to be made   PHYSICAL EXAMINATION:  Remarkable for a blood pressure of 150/88, pulse  100 and regular. Afebrile. Respiratory rate is 14. Weight is 149.  Affect is appropriate, but sad.  HEENT: Is unremarkable. She has bilateral carotid bruits and a left CEA  scar.  LUNGS:  Clear, good diaphragmatic motion. No wheezing.  S1, S2 with normal heart sounds. PMI is normal.  ABDOMEN: Benign. Bowel sounds positive, nontender. There is no  hepatosplenomegaly. No hepatojugular reflux. No AAA. No bruit.  Femoral pulses are +3 without bruit. PT's are +2 in  the lower extremity  edema.  NEURO: Nonfocal. No muscular weakness.  SKIN: Warm and dry.   IMPRESSION:  1. Recent transient ischemic attack with left carotid endarterectomy.      Followup baseline carotid duplex particularly given residual      bruits. The patient prefers to have her duplex scans done here      rather than Dr. Adele Dan office. We will arrange a baseline this      week. She does have a followup appointment with Dr. [name] I      believe November 18. She will continue her aspirin and Plavix.  2. Hypercholesterolemia. Followup lipid and liver profile in January.      Continue simvastatin and Zetia. Need for tight control given her      marked intimal hyperplasia and progression of carotid disease that      led to her transient ischemic attack.  3. Smoking cessation. The patient not interested in Wellbutrin at this      time. She will try to cut back on her own. She is  smoking a half      pack a day. She understands the relationship between her carotid      disease, transient ischemic attack and smoking.  4. Hypertension with relative tachycardia. Stop Norvasc, which is      ineffective. Add Toprol 50 mg a day, called in to [employer]      on [employer].  5. Anxiety, depression. The patient asked for a short term      prescription for Xanax which she apparently has had before. I gave      her 0.25 of Xanax one month supply with one refill. She knows she      needs to talk to Dr. Cato Mulligan about any further refills.   Overall, I think Weronika is doing well, although she has had quite a lot on  her plate lately. I will see her back in three months.     Noralyn Pick. Eden Emms, MD, [facility]  Electronically Signed    PCN/MedQ  DD: 11/30/2006  DT: 11/30/2006  Job #: [id number]

## 2010-06-10 NOTE — Assessment & Plan Note (Signed)
[facility]                            CARDIOLOGY OFFICE NOTE   NAME:[name] [name] HASSINGER                        MRN:          [id number]  DATE:05/27/2006                            DOB:          April 24, 1945    [name] [name] returns today for followup. She had a recent hospitalization. She  had presented with right-sided TIAs. She had severe bilateral carotid  disease. Her peak velocity on the right side was actually higher at 3  meters per second, but she appeared to have an ulcerated plaque on the  left. She had a left carotid endarterectomy by Dr.  Durwin [name] and her  symptoms have resolved. During the hospitalization, she also had a TEE  looking for source of embolus. It was essentially benign.   Her LV function was 65% to 75%. I told [name] [name] that I was somewhat  concerned about her heart. She had fairly rapid progression and a large  atheromatous burden in her carotids and aorta. Her last stress test was  in 2006 and she will be referred for repeat stress Myoview. In terms of  her symptoms, she has not had any recurrent arm numbness or tingling.  There has been no slurred speech. She has not had any weakness in the  upper or lower extremities and no headaches.   In regards to her heart, she has not had any significant palpitations,  PND or orthopnea, and there has been no chest pain.   She has no documented coronary disease.   REVIEW OF SYSTEMS:  Is otherwise negative.   CURRENT MEDICATIONS:  1. Norvasc 5 a day.  2. Celebrex 200 a day.  3. Aspirin a day.  4. Simvastatin 40 a day.  5. Calcium.  6. Nexium 40 a day. She has already had her followup visit with Dr.      Woodfin [name].   PHYSICAL EXAMINATION:  Is remarkable for a healthy-appearing white woman  in no distress. Her weight is 156. Her blood pressure is 140/80. Pulse  88 and regular. Respirations are 12. She is afebrile.  HEENT: Is normal except for the recent left CEA scar. She has residual  bruit on  the right side. There is no thyromegaly and there is no  lymphadenopathy.  LUNGS:  Are clear. There is no use of accessory muscles.  There is an S1, S2 with normal heart sounds. PMI is not palpable.  JVP  not elevated  ABDOMEN: Is benign. There are no renal bruits. No hepatosplenomegaly. No  masses. No AAA. Bowel sounds are positive.  Distal pulses are intact. I do not hear femoral bruits. She has +2 PTs  bilaterally.  NEURO: Is currently nonfocal with no residual deficits. There is no  muscular weakness.  SKIN: Warm and dry.  No muscular weakness   IMPRESSION:  Recent transient ischemic attack with bilateral carotid  disease. She is likely to need the right carotid done in the next 3 to 6  months. We will start her on Plavix in addition to her aspirin for  stroke prophylaxis. I told her to make sure Dr.  Durwin [name]  knew we were  starting Plavix as it may need to be stopped prior to right CEA. She  will followup with Dr.  Durwin [name] to have a repeat duplex in six months.   She knows to call us immediately if she were to have any recurrent TIA  symptoms.   Again, I am somewhat concerned about progressive heart disease as well  given the amount of plaque in her descending aorta on TEE and the  progression of her carotid disease. She will have a stress Myoview study  next week. She was also given a prescription today for Xanax for sleep.  I will see her back in three months.     [name] [name]. [name] Emms, MD, [facility]  Electronically Signed    PCN/MedQ  DD: 05/27/2006  DT: 05/27/2006  Job #: [id number]

## 2010-06-10 NOTE — Assessment & Plan Note (Signed)
[facility]                            CARDIOLOGY OFFICE NOTE   NAME:[name], [name]                        MRN:          [id number]  DATE:01/24/2008                            DOB:          05-Feb-1945    [name] [name] returns today for followup.  She had her right carotid  endarterectomy done by Dr. [name].  He had previously done her left  side.  She had recurrent TIA and had a high-grade lesion.  She has  recovered well.  Her scar is healing well and starting to fade.  Fortunately, she has quit smoking.  We had talked to her about this at  length in regards to her advanced vascular disease.  We placed her on  Wellbutrin.  She continues to abstain.  She knows to call me if she has  any urges to start smoking again, counseled her for less than 10 minutes  regarding continued smoking cessation and her vascular disease.   The patient denies significant chest pain, PND, or orthopnea.  There has  been no syncope or palpitations.   She is concerned about her blood pressure medicine.  I told today that  she needed to be on her blood pressure pills.  The amlodipine and Toprol  have done a good job bringing her blood pressure down.   She has been intolerant to LIPITOR and ZETIA in the past.  She is  tolerating high-dose simvastatin.   PHYSICAL EXAMINATION:  GENERAL:  Remarkable for a talkative, animated  female in no distress.  VITAL SIGNS:  Her blood pressure is 150/80; pulse 82 and regular; weight  150; and respiratory rate 14, afebrile.  HEENT:  Unremarkable.  NECK:  Carotids are normal without bruit.  Carotid status post bilateral  CEAs with a residual bruit in the right.  No lymphadenopathy,  thyromegaly, or JVP elevation.  LUNGS:  Clear, good diaphragmatic motion.  No wheezing.  HEART:  S1 and S2 with normal heart sounds.  There is a soft systolic  ejection murmur.  PMI normal.  ABDOMEN:  Benign.  Bowel sounds positive.  No AAA.  No tenderness.  No  bruit.  No hepatosplenomegaly.  No hepatojugular reflux.  EXTREMITIES:  Distal pulses are intact.  No edema.  NEURO:  Nonfocal.  SKIN:  Warm and dry.  MUSCULOSKELETAL:  No muscular weakness.   IMPRESSION:  1. Vascular disease status post bilateral carotid endarterectomy.      Followup carotid duplex in January to get a baseline on the right      side in particular that has a residual bruit.  Continue aspirin      therapy.  2. Hypertension.  Currently borderline controlled.  Continue Toprol      and amlodipine.  Consider addition of ACE inhibitor in the future,      low-sodium diet.  3. Smoking cessation.  Continue to abstain Wellbutrin 150 a day.  She      will call me if she has any urges to go back to it.  She      understands the importance in regards to her  vascular disease.  4. Hypercholesterolemia.  Continue simvastatin.  Followup with Dr.      Cato Mulligan.  Lipid and liver profile in 6 months.     Noralyn Pick. Eden Emms, MD, [facility]  Electronically Signed    PCN/MedQ  DD: 01/24/2008  DT: 01/24/2008  Job #: [id number]

## 2010-06-10 NOTE — Op Note (Signed)
[name], [name] NO.:  [id number]   MEDICAL RECORD NO.:  [id number]          PATIENT TYPE:  INP   LOCATION:  3316                         FACILITY:  [facility]   PHYSICIAN:  [name] [name], M.D.DATE OF BIRTH:  Apr 27, 1945   DATE OF PROCEDURE:  08/17/2007  DATE OF DISCHARGE:                               OPERATIVE REPORT   PREOPERATIVE DIAGNOSIS:  Asymptomatic greater than 80% right carotid  stenosis.   POSTOPERATIVE DIAGNOSIS:  Asymptomatic greater than 80% right carotid  stenosis.   PROCEDURE:  Right carotid endarterectomy with Dacron patch angioplasty.   SURGEON:  [name] Kindle. [name], MD   ASSISTANTS:  1. [name] Skye. Hart Rochester, MD  2. [name] Coombe, PA   ANESTHESIA:  General.   INDICATIONS:  This 65 year old woman who had undergone previous left  carotid endarterectomy for a symptomatic left carotid stenosis.  I have  been following the moderate right carotid stenosis, which progressed to  greater than 80%.  Right carotid endarterectomy was recommended in order  to lower her risk of future stroke.   TECHNIQUE:  The patient was taken to the operating room and received  general anesthetic.  Arterial line had been placed by Anesthesia.  The  right neck was prepped and draped in usual sterile fashion.  An incision  was made along the anterior border of the sternocleidomastoid, and  dissection carried down to the common carotid artery, which was  dissected free and controlled with Rumel tourniquet.  The facial vein  was divided between 2-0 silk ties and the internal carotid artery was  controlled above the plaque with a red vessel loop.  The superior  thyroid artery and external carotid artery were controlled with vessel  loop.  The patient was then heparinized.  Clamps were then placed on the  internal and the external and the common carotid artery.  A longitudinal  arteriotomy was made in the common carotid artery, and this was extended  through the plaque into the internal carotid artery above the plaque.  A  10 shunt was placed into the internal carotid artery, back bled, and  then placed into the common carotid artery and secured with Rumel  tourniquet.  Flow was reestablished to the shunt.  An endarterectomy  plane was established proximally, and the plaque was sharply divided.  Eversion endarterectomy was performed of the external carotid artery.  Distally, there was a nice taper in the plaque and no tacking sutures  were required.  The artery was irrigated with copious amounts of heparin  and dextran, and all loose debris removed.  Next, the Dacron patch was  sewn using continuous 6-0 Prolene suture.  Prior to completing the patch  closure, the shunt was removed.  The arteries were back bled and flushed  appropriately.  The anastomosis was completed and then flow was  reestablished first to the external carotid artery and then to the  internal carotid artery.  At the completion, there was a good pulse  distal to the patch and a good Doppler flow.  Flow in the external was  damp, and therefore,  I made a small transverse arteriotomy in the  external carotid artery.  After the external carotid artery was clamped  proximally and distally, there was really no significant intima and this  was simply closed with a running 6-0 Prolene suture.  Hemostasis was  obtained in the wound.  The wound was closed with a deep layer of 3-0  Vicryl.  The platysma was closed with running 3-0 Vicryl, and the skin  was closed with a 4-0 subcuticular stitch.  Sterile dressing was  applied.  The patient tolerated procedure well and awoke neurologically  intact.  All needle and sponge counts were correct.       [name] [name], M.D.  Electronically Signed     CSD/MEDQ  D:  08/17/2007  T:  08/18/2007  Job:  [id number]   cc:   Noralyn Pick. Eden Emms, MD, [facility]  Scott M. Kriste Basque, MD

## 2010-06-10 NOTE — Discharge Summary (Signed)
NAMELASHINA, MILLES NO.:  [id number]   MEDICAL RECORD NO.:  [id number]          PATIENT TYPE:  INP   LOCATION:  3316                         FACILITY:  [facility]   PHYSICIAN:  [name]. [name], M.D.DATE OF BIRTH:  [birth year]   DATE OF ADMISSION:  08/17/2007  DATE OF DISCHARGE:  08/18/2007                               DISCHARGE SUMMARY   REASON FOR ADMISSION:  Greater than 80% right carotid stenosis.   HISTORY:  This is a pleasant 65 year old woman who had undergone  previous left carotid endarterectomy for a asymptomatic left carotid  stenosis.  I have been following a moderate right carotid stenosis and  this progressed to greater than 80%.  For this reason, right carotid  endarterectomy was recommended in order to lower her risk of future  stroke.  The procedure and potential complications including, but not  limited to 1-2% risk of perioperative stroke were discussed with the  patient preoperatively.  All of her questions were answered and she was  agreeable to proceed.  Her Plavix was stopped 1 week preoperatively.  The remainder of her history and physical is as dictated without  addition or deletion.   HOSPITAL COURSE:  The patient was admitted on August 17, 2007, and  underwent an uneventful right carotid endarterectomy with Dacron patch  angioplasty.  She was monitored in the step-down unit overnight and  remained neurologically intact and hemodynamically stable.  Her blood  pressure was reasonably well controlled.  On postoperative day #1, she  was ambulating, eating, and had no neurologic deficit.  She was  discharged home with instructions to follow up in my office in 2 weeks.  Of note, she has been concerned about her blood pressure being more  difficult to control; and so when she returns in 2 weeks, we will obtain  a renal artery duplex to rule out renal artery stenosis.   DISCHARGE MEDICATIONS:  The same as on admission.  1. Prilosec OTC 20  mg p.o. daily.  2. Aspirin 325 mg p.o. daily.  3. Plavix 75 mg p.o. daily.  4. Simvastatin 80 mg p.o. nightly.  5. Metoprolol ER 50 mg p.o. daily.  6. Trazodone 50 mg p.o. nightly p.r.n.  7. Wellbutrin 1 p.o. daily.   DISPOSITION:  Discharge is to home.   CONDITION ON DISCHARGE:  Good.   DISCHARGE DIAGNOSIS:  Asymptomatic greater than 80% right carotid  stenosis.   SECONDARY DIAGNOSES:  Hypertension and hypercholesterolemia.      [name]. [name], M.D.  Electronically Signed     CSD/MEDQ  D:  08/18/2007  T:  08/18/2007  Job:  [id number]   cc:   Valetta Mole. Swords, MD  Noralyn Pick. Eden Emms, MD, [facility]

## 2010-06-10 NOTE — Discharge Summary (Signed)
[name] [name], [name] [name] NO.:  [id number]   MEDICAL RECORD NO.:  [id number]          PATIENT TYPE:  INP   LOCATION:  3312                         FACILITY:  [facility]   PHYSICIAN:  Lonzo Cloud. Kriste Basque, MD     DATE OF BIRTH:  1946/01/10   DATE OF ADMISSION:  04/25/2006  DATE OF DISCHARGE:  04/30/2006                               DISCHARGE SUMMARY   FINAL DIAGNOSES:  1. Admitted April 25, 2006 by Dr. Tyson Alias with a transient ischemic      attack,  having right-sided numbness and mild weakness in her right      leg.  Evaluation revealed severe plaques and stenosis in the left      internal carotid.  The patient was seen in consultation by Dr.      Woodfin Ganja for vascular surgery.  He proceeded with a left carotid      endarterectomy on April 29, 2006.  The patient was discharged home      April 4, by Dr. Durwin Nora for outpatient followup.  2. History of arteriosclerotic peripheral vascular disease with known      carotid stenoses.  3. Hypertension, controlled on medications.  4. History of hypercholesterolemia.  The patient has refused statin      therapy.  5. History of gastroesophageal reflux disease.  6. History of osteoarthritis.  7. History of tobacco abuse.   BRIEF HISTORY AND PHYSICAL:  The patient is a 65 year old white female  with multiple problems as noted above.  She presented emergency room on  April 25, 2006 and was admitted by Dr. Tyson Alias with right-sided  numbness and mild right leg weakness with acute onset and gradual  decline over the time in the emergency room.  She had a history of known  carotid disease in the past and was being followed.  She was suspected  to have a TIA and admitted for further evaluation and treatment.   PAST MEDICAL HISTORY:  1. As noted above, she has a history of hypertension controlled on      medications.  2. She has a history of hypercholesterolemia, but has refused      medications.  3. She has known mild carotid disease  and was being followed.  4. She continues to smoke regularly and has been unwilling to attempt      smoking cessation strategies.  5. She has a history of gastroesophageal reflux disease.  6. Osteoarthritis.   PHYSICAL EXAMINATION:  GENERAL:  Examination on admission revealed a 55-  year-old white female in no acute distress.  VITAL SIGNS:  Blood pressure 152/84, pulse of 100 and regular,  respirations of 18 per minute, nonlabored, temperature 98.6.  HEENT:  Unremarkable.  NECK:  Faint carotid bruit.  No thyromegaly or lymphadenopathy.  CHEST:  Clear to percussion and auscultation.  CARDIAC:  Cardiac exam revealed regular rhythm with grade 1/6 systolic  ejection murmur at the left sternal border.  No rubs or gallops heard.  ABDOMEN:  Abdomen was soft and nontender without evidence of  organomegaly or masses.  EXTREMITIES:  No cyanosis, clubbing  or edema.  NEUROLOGIC:  Nonfocal at the time of evaluation in the ER.   LABORATORY DATA:  EKG showed normal sinus rhythm and was felt to be  within normal limits.  CT of the brain on admission showed no acute  abnormalities.  Chest x-ray showed chronic bronchitic changes, normal  heart size.  MRI of the brain showed no acute ischemia, evidence of  small vessel disease in the subcortical white matter, mild ethmoid sinus  disease.  Carotid Doppler showed 60-79% bilateral carotid obstructions.  A  2-D echocardiogram showed normal left ventricular size and function  with ejection fraction around 70%.  Hemoglobin 14.9, hematocrit 43.5,  white count 7600with a normal differential.  Sed rate 11.  PT 12.4, INR  0.9, PTT 28 seconds.  Sodium 141, potassium 4.8, chloride 112, CO2 26,  BUN 11, creatinine 0.67, blood sugar 96, calcium 9.4, total protein 6.9,  albumin 3.8, AST 20, ALT 17, alk phos 89, total bilirubin 0.7, magnesium  2.3,  phosphorus 3.8, homocystine 8.6, hemoglobin A1C  6.  Cholesterol  298, triglycerides 194, HDL 36, LDL 223.  This was  repeated and  confirmed.  Urinalysis clear.  Blood type B negative, RPR negative.   HOSPITAL COURSE:  The patient was admitted with TIA and the patient was  initially seen in consultation by Dr. Sandria Manly for neurology.  MRI was done  along with carotid Dopplers as noted.  She had a marked right  progression of her previous mild bilateral carotid disease.  She had  another TIA while in the hospital and was seen by Dr. Woodfin Ganja for  vascular surgery.  He agreed to proceed with left carotid endarterectomy  on April 29, 2006.  He took over the patient's care at that time and felt  that she was [facility] and ready for discharge the day after surgery on April 30, 2006 and proceeded to discharge the patient home at that time, but  declined to dictate this discharge summary.   DISCHARGE MEDICATIONS:  Dr. Durwin Nora discharge the patient home on April 30, 2006 on:  1. Aspirin 325 mg p.o. daily.  2. Celebrex 200 mg p.o. daily as needed.  3. Simvastatin 40 mg p.o. at bedtime.  4. Nexium 40 mg p.o. daily.  5. Norvasc 5 mg p.o. daily.  6. Tylox one to two tablets every 4 hours as needed for pain.   FOLLOW UP:  The patient was instructed to follow up with Dr. Durwin Nora in  the office on May 26, 2006 and with Dr. Pearlean Brownie for neurology in two  months.   CONDITION ON DISCHARGE:  Improved.      Lonzo Cloud. Kriste Basque, MD  Electronically Signed     SMN/MEDQ  D:  06/04/2006  T:  06/05/2006  Job:  [id number]

## 2010-06-13 NOTE — Consult Note (Signed)
NAMEANUSHA, CLAUS NO.:  [id number]   MEDICAL RECORD NO.:  [id number]          PATIENT TYPE:  INP   LOCATION:  5727                         FACILITY:  [facility]   PHYSICIAN:  Genene Churn. Love, M.D.    DATE OF BIRTH:  05-19-1945   DATE OF CONSULTATION:  DATE OF DISCHARGE:                                 CONSULTATION   This 65 year old right-handed white married female is seen for  evaluation of left brain TIAs.   HISTORY OF PRESENT ILLNESS:  Ms. [name] [name] has a 5-year history of  hypertension, a 20-year history of hyperlipidemia for which she has  tried Questran and Lipitor in the past but had side effects,  gastroesophageal reflux disease, osteoarthritis, and chronic cigarette  use, smoking half a pack per day for over 40 years.  On April 25, 2006,  while getting into the tub at 7:20 p.m., she developed the onset of  numbness involving the right side of the face, arm and right leg lasting  minutes.  She took the bath at that time, came out of the tub, and  symptoms seemed to resolve.  She felt better, then 6 minutes later she  had another episode which lasted about 2 minutes, then she had a third  episode also lasting minutes, this one associated with dizziness and a  sensation as if the room was spinning.  This resolved and she came by  car to the emergency room after her third episode.  She had some  continued sensation of right hand and arm tingling.  CT scan of the  brain and MRI study of the brain with intracranial MRA were negative  except for the question of a 2-mm outpouching of the right superior  cerebellar artery.  She has a past history of Doppler study showing 20%  ICA stenosis and had a Doppler study on this admission showing right  proximal 60-79% disease right and left internal carotid.  She has had a  2-D echocardiogram which has shown no significant abnormalities.  She  has been on aspirin in the hospital.  She has not had any recurrent  symptomatology.  Her blood pressure in the right and left arm 160/80 and  170/80, heart rate was 112.  There was a right and left carotid and  supraclavicular bruits heard that was radiating from the heart.  She was  alert and oriented x3.  Her cranial nerve examination revealed visual  fields full.  Disks flat.  Extraocular full.  Corneals present.  No  seventh nerve palsy.  Hearing intact.  Air conduction greater than bone  conduction.  Tongue midline.  Uvula midline.  Gags present.  Sternocleidomastoid and trapezius testing normal.  Motor examination  revealed 5/5 strength proximally and distally in the upper lower  extremities.  Coordination testing normal.  Sensory examination intact  to pinprick, touch, joint position and vibration testing.  Deep tendon  reflexes 2+.  Plantar responses downgoing.  Gait examination  unremarkable.   IMPRESSION:  1. Left brain transient ischemic attack, code 435.9.  2. Carotid disease bilaterally, code 433.10.  3.  Hypertension, code 796.2.  4. Hyperlipidemia, code 272.4.  5. Chronic cigarette use, code 496.   The plan at this time is to discontinue cigarettes, no driving x3 weeks,  begin Aggrenox 25/200 one daily for 2 weeks then one twice per day, a  TEE or transcranial bubble study, and a sed rate and RPR.          ______________________________  Genene Churn. Sandria Manly, M.D.    JML/MEDQ  D:  04/27/2006  T:  04/27/2006  Job:  [id number]

## 2010-06-13 NOTE — H&P (Signed)
[name] [name], WEABER NO.:  [id number]   MEDICAL RECORD NO.:  [id number]          PATIENT TYPE:  INP   LOCATION:  5727                         FACILITY:  [facility]   PHYSICIAN:  Nelda Bucks, MD DATE OF BIRTH:  02-29-1945   DATE OF ADMISSION:  04/25/2006  DATE OF DISCHARGE:                              HISTORY & PHYSICAL   HISTORY OF PRESENT ILLNESS:  This is a 65 year old white female with a  history of hypertension diagnosed fiver years ago being treated  currently.  She came in with a chief complaint of right sided numbness,  acute onset in nature with a slight headache.  Approximately 7:15 p.m.  patient was getting in to her bathtub and she had sudden onset of right  arm, leg and face tingling/numbness.  She eventually developed onset of  weakness of the right leg alone and developed some slight dizziness  which lasted seconds.  These symptoms intermittently resolved and  recurred over approximately 15 minute period of time.  She decided to go  in to the emergency room with her husband by car.  She also upon entry  to the emergency room said that she felt a slight onset of a frontal  headache bilaterally radiating to the temporal areas, very mild headache  which resolved spontaneously.  Her symptoms overall at home lasted she  says minutes and had full resolution of her symptoms, full resolution of  all of her weakness. She did report some mild persistent tingling just  in her fingertips in the emergency room.  There was no description of  any loss of consciousness, double vision, blurry vision, no chest pain  and no palpitations.   ALLERGIES:  She has allergies to sulfa for which she said as a child she  developed hives.   PAST MEDICAL HISTORY:  1. Hypertension x5 years, well controlled according to the patient.  2. Arthritis, osteoarthritis of the knees bilaterally.  3. Gastroesophageal reflux disease.  4. Hypercholesterolemia per patient.  5. No  history of prior CVA.  6. No history of diabetes.  7. She has had a prior carotid duplex scan according to patient      approximately one year ago which she reported 20% stenosis      bilaterally.   PAST SURGICAL HISTORY:  1. Sinus surgery.  2. Broken finger requiring pin placement.   MEDICATIONS:  1. Lunesta 1 mg p.o. q.h.s. p.r.n.  2. Nexium 40 mg p.o. daily  3. Norvasc 5 mg daily.  4. Aspirin 325 mg.   SOCIAL HISTORY:  She is a current smoker of approximately 10 cigarettes  per day according to patient.  No IV drug abuse.  No alcoholism.  She is  married, she has no children.  She currently works at the [employer]   FAMILY HISTORY:  Positive for hypertension and COPD with no prior  history of CVA in her family history.   REVIEW OF SYSTEMS:  Patient denies fever, cough, chills, chest pain,  palpitations.   REVIEW OF SYSTEMS:  Positive in history for again noting the  carotid  Dopplers have 20% stenosis bilaterally one year ago.  She has had three  stress tests according to patient which were read as negative.  I would  also like to mention on review of systems, according to patient, she was  started on what she thought was statins in the past which caused  worsening muscle aches.   PHYSICAL EXAMINATION:  Temperature 98.6.  Heart rate 113.  Blood  pressure 153/84.  Respiratory rate of 12 to a range of 25, currently 17.  GENERAL INSPECTION:  Revealed a very pleasant female appearing her  stated age in an upright position in bed in no distress.  HEENT/NECK EXAM:  Showed approximately 3-4 cm jugular venous distention  at 20 degrees.  She had seborrheic keratosis.  No thyromegaly or masses  felt.  There was a possible slight bruit bilaterally in the carotid  region versus this was an ejection systolic murmur radiating to the  carotid.  LUNGS:  Showed clear to auscultation bilaterally, slightly distant lung  sounds.  HEART:  S1, S2, regular rhythm,  tachycardic, no rubs or gallops.  Possible 1/6 systolic murmur heard  best at the second intercostal space  radiating to the carotid.  ABDOMEN:  Shows a belly which is soft.  Bowel sounds are present.  Nontender, nondistended with several seborrheic keratosis noted, no  rebound, no guarding and no bruits heard in the renal or aortic area.  EXTREMITIES:  Showed good pulses in the TP and DP with no edema.  CENTRAL NERVOUS SYSTEM EXAMINATION:  Showed patient to be alert and  oriented x3.  She is nonfocal, 5/5 power throughout all extremities with  flexion and extension including proximal and distal muscle groups.  Pupils equal, round and reactive to light at 4 mm.  Cranial nerves II-  XII grossly intact fully.  Patient did report again some slight tingling  in all of her fingers on the right hand side with no weakness  whatsoever.  She has no pronator drift and cerebellar signs are within  normal limits.   LABORATORY DATA:  Shows a sodium 141, potassium 4.8, chloride of 112,  CO2 of 27, BUN 18, creatinine of 0.7, glucose 120.  Her venous blood gas  sample showed a pH of 7.30, PC02 of 51.  Her EKG showed sinus  tachycardia with a rate of 103 with a hint of some early repolarization  but no significant ST segment changes or T-wave changes.  White blood  cell count is 7.6, hemoglobin is 14, hematocrit 43.5 and platelet count  normal at 312 with an MCV of 88.8.  Neutrophils are 72%, lymphocytes are  22%.  CT scan of the head I reviewed showed a possible old lacunar  infarct in the left parietal region but no acute changes. Otherwise, her  PT is 12.4, INR is 0.9, all within normal limits.   ASSESSMENT/PLAN:  1. Transient ischemic attack to rule out a cerebrovascular accident.      Patient will have an echocardiogram to rule out a thrombus or clot.      Also to rule out for any type of atrial septal defect. Patient will      also have carotid Dopplers bilaterally  performed in the morning.      We will have her neuro checks at q.4 hourly.  She will be      continuing her aspirin at 325 mg.  Depending on findings and      clinical progression, we could make a consideration  for adding      Plavix or having a neurologist consult as this patient does present      with aspirin with transient ischemic attacks and they require      increase in her regimen for prophylaxis to try to reduce stroke      incident.  I did review a CT scan of the head, there is a possible      old lacunar infarct, it is extremely small.  Her EKG has been      reviewed also, we will repeat that in the morning time.  There is      no hint per history of any palpitations or loss of consciousness or      anything to believe any significant arrhythmias occurring in this      patient.  Have her head of bed elevated at 30 degrees, bed rest      tonight only.  We will monitor blood pressure closely with having      some goals of systolics less than 170.  With an acute TIA and      looking out for  CVA, it will be reasonable to allow systolic blood      pressure in the 150 to 170 range.  A pulse oximetry will be      performed x1 to assure that this patient has adequate saturations      of 94 to 95%.  I will check a cholesterol panel for this patient as      well as homocysteine level.  This patient to note per history had      been tried on a statin  and had arthralgias described.  So although      this would be beneficial for this patient this moment, I would not      be able to start the statin at this time.  Also in the same      context, we will continue her Norvasc home medication although      there is a potential benefit of adding a beta blocker and we will      make a consideration for this during her hospitalization.  2. Hypertension, we will continue her Norvasc at the current dosage.      Her systolic was 150, she did take her medication today and again      we will have a consideration for an addition of  beta blocker      tomorrow if hypertension is recurrent.  3. Slight hyperkalemia.  If this patient does require fluid, we will      provide this patient with half normal saline and recheck her      chemistries in the morning time.  4. Gastroesophageal reflux disease/gastric prophylaxis, we will      continue patient's Nexium.  5. History of insomnia, patient will be given her Lunesta home      medication low dose 1 mg for tonight.      Nelda Bucks, MD  Electronically Signed     DJF/MEDQ  D:  04/26/2006  T:  04/26/2006  Job:  [id number]   cc:   Lonzo Cloud. Kriste Basque, MD

## 2010-06-13 NOTE — Consult Note (Signed)
[name] [name], [name] [name] NO.:  [id number]   MEDICAL RECORD NO.:  [id number]          PATIENT TYPE:  INP   LOCATION:  5727                         FACILITY:  [facility]   PHYSICIAN:  [name]. [name], M.D.DATE OF BIRTH:  1945/07/04   DATE OF CONSULTATION:  04/27/2006  DATE OF DISCHARGE:                                 CONSULTATION   REASON FOR CONSULTATION:  Symptomatic left carotid stenosis.   REQUESTING PHYSICIAN:  Dr. Alroy Dust.   HISTORY:  This is a pleasant 65 year old  right-handed woman within her  usual state of good health until 04/25/2006 when at 7:15 p.m. she  developed the sudden onset of paresthesias in her right arm, right leg  and right side of her face.  In addition, she had weakness in the right  leg.  She had some problems ambulating.  This episode lasted 2-3 minutes  and then resolved.  At  8:15 p.m. the symptoms returned with  paresthesias in the right arm and right leg and weakness in the right  leg.  She came to the emergency room and her symptoms gradually improved  over the next 24 hours.  She only has some mild paresthesias in her  fingertips in the right hand.   Prior to this episode she had had no previous history of stroke, TIAs,  expressive or receptive aphasia, or amaurosis fugax.   PAST MEDICAL HISTORY:  1. Hypertension.  2. Hypercholesterolemia.  She has had some problems with medications      and currently the cholesterol is not well controlled.  She denies      any history of diabetes, history of previous myocardial infarction,      history of congestive heart failure.   PAST SURGICAL HISTORY:  Significant for sinus surgery in the past.   MEDICATIONS:  Documented on her admission history and physical.   SOCIAL HISTORY:  She is married.  She does not have any children.  She  does not have a history of alcohol abuse.  She has been smoking  cigarettes since she was 65 years old.  She had smoked a pack per day of  cigarettes  for years but has cut back to half-a-pack per day.   FAMILY HISTORY:  There is no history of premature cardiovascular  disease.  There is no previous history of strokes in the family.   REVIEW OF SYSTEMS:  She has had no recent chest pain, chest pressure,  palpitations or arrhythmias.  PULMONARY:  She had no recent bronchitis,  asthma or wheezing.  VASCULAR:  She has had some bilateral calf  claudication.  She has had no rest pain or nonhealing ulcers.  She has  had no history of DVT or phlebitis.  She does have arthritis.  Review of  systems is otherwise unremarkable and is documented on her admission  history and physical.   PHYSICAL EXAMINATION:  Temperature is 98.2, heart rate is 81.  Blood  pressure 127/71.  HEENT:  There is no cervical lymphadenopathy.  She has bilateral carotid  bruits.  LUNGS:  Clear bilaterally to auscultation.  CARDIAC:  she has a regular rate and rhythm.  ABDOMEN:  Soft and nontender.  I cannot palpate an aneurysm.  She has  palpable femoral, popliteal, dorsalis pedis and posterior tibial pulses  bilaterally.  She has no focal weakness in the upper extremities or  lower extremities on either side.  She has no significant paresthesias  currently.   CT scan of the head on 03/30 showed no acute CVA.  MRI on 03/31 showed  no acute left CVA.  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  Laboratory evaluation this  admission also shows a cholesterol 298 and LDL cholesterol 223.   Duplex scan shows evidence of a bilateral 60-79% carotid stenoses in the  ICAs.  Of note, this is in the mid range on both sides.  Vertebral  arteries are patent with normally directed flow.   IMPRESSION:  This patient most likely has a symptomatic 70% left carotid  stenosis.  I would recommend left carotid endarterectomy in order to  lower her risk of future  stroke.  I think it would be reasonable to  proceed this admission and I could potentially do her surgery Thursday  and Friday if she is cleared from a cardiac standpoint.  She tells me  that she still has one further cardiac test which is pending.  In  addition, her cholesterol is poorly controlled and if cardiology feels  it would be safer to get this under better control before surgery then  we can certainly delay her surgery.  She is currently being maintained  on Aggrenox.  She had been on aspirin.  We have also discussed the  importance of tobacco cessation.  Neurology plans on following up the  possible aneurysm with a follow-up MRA.  Finally, with respect to the  asymptomatic right carotid stenosis I will follow this with 39-month  duplex scan.  She understands we would not consider right carotid  endarterectomy unless the stenosis progressed to greater than 80%.  I  will follow this admission.      [name]. [name], M.D.  Electronically Signed     CSD/MEDQ  D:  04/27/2006  T:  04/27/2006  Job:  [id number]   cc:   Lonzo Cloud. Kriste Basque, MD  Genene Churn. Love, M.D.

## 2010-06-13 NOTE — Op Note (Signed)
NAMEMIKAHLA, WISOR NO.:  [id number]   MEDICAL RECORD NO.:  [id number]          PATIENT TYPE:  INP   LOCATION:  5737                         FACILITY:  [facility]   PHYSICIAN:  [name]. [name], M.D.DATE OF BIRTH:  [birth year]   DATE OF PROCEDURE:  04/29/2006  DATE OF DISCHARGE:                               OPERATIVE REPORT   PREOPERATIVE DIAGNOSIS:  Symptomatic left carotid stenosis.   POSTOPERATIVE DIAGNOSIS:  Symptomatic left carotid stenosis.   PROCEDURES:  Left carotid endarterectomy with Dacron patch angioplasty.   SURGEON:  [name]. [name], M.D.   ASSISTANT:  Constance Holster, [place]   ANESTHESIA:  General.   INDICATIONS:  This is a 65 year old woman who had three episodes of  transient right upper extremity and right lower extremity paresthesias  with right upper extremity weakness.  She had a 60-79% left carotid  stenosis by duplex.  MRA showed a moderate stenosis on the left which  looked ulcerated.  Left carotid endarterectomy was recommended in order  to lower her risk of future stroke.   TECHNIQUE:  The patient was taken to the operating room, received a  general anesthetic.  The left neck was prepped and draped in usual  sterile fashion.  An incision was made along the anterior border of the  sternocleidomastoid and dissection carried down to the common carotid  artery which was dissected free and controlled with a Rumel tourniquet.  The facial vein was divided between 2-0 silk ties and the internal  carotid artery was controlled above the plaque with a blue vessel loop.  The external and superior thyroid arteries were also controlled.  The  patient was heparinized with 7000 units of IV heparin.  Clamps then  placed on the internal, then the common and external carotid artery.  A  longitudinal arteriotomy was made in the common carotid artery.  This  was extended through the plaque into the internal carotid artery above  the plaque.   The 10 Bard shunt was placed into the internal carotid  artery, back bled and placed into the common carotid artery and secured  with Rumel tourniquet.  Flow was reestablished through the shunt.  Of  note, the plaque was moderate, approximately 60%, was quite ulcerated  with significant calcification essentially a coral reef plaque.  Endarterectomy plane was established proximally and the plaque was  sharply divided.  Eversion endarterectomy was performed of the external  carotid artery.  Distally there is a nice taper in the plaque and no  tacking sutures were required to the distal internal carotid artery.  The artery was then irrigated with copious amounts of heparin and  dextran and all loose debris was removed.  Dacron patch was then sewn  using continuous 6-0 Prolene suture.  Prior to completing the patch  closure, the artery was back bled and flushed appropriately and  anastomosis completed.  Flow was reestablished first to the external  carotid artery and into the internal carotid artery.  At completion  there was good pulse distal to the patch and a good Doppler signal.  Hemostasis was  obtained in the wound.  The wound was closed with deep  layer of 3-0 Vicryl.  The  platysma was closed with running 3-0 Vicryl.  The skin was closed with 4-  0 subcuticular stitch.  Sterile dressing was applied.  The patient  tolerated procedure well and awoke neurologically intact.  All needle  and sponge counts were correct.      [name]. [name], M.D.  Electronically Signed     CSD/MEDQ  D:  04/29/2006  T:  04/29/2006  Job:  [id number]   cc:   [facility]

## 2010-06-13 NOTE — Consult Note (Signed)
[name] [name], [name] [name] NO.:  [id number]   MEDICAL RECORD NO.:  [id number]          PATIENT TYPE:  INP   LOCATION:  5737                         FACILITY:  [facility]   PHYSICIAN:  Arturo Morton. Riley Kill, MD, FACCDATE OF BIRTH:  05-18-1945   DATE OF CONSULTATION:  04/27/2006  DATE OF DISCHARGE:                                 CONSULTATION   CARDIOLOGY CONSULTATION   PRIMARY CARDIOLOGIST:  Theron Arista C. Eden Emms, MD, [facility].   PRIMARY CARE PHYSICIAN:  Lonzo Cloud. Kriste Basque, MD.   REASON FOR CONSULTATION:  Cardiology evaluation in the setting of a TIA  with known carotid artery disease.   HISTORY OF PRESENT ILLNESS:  A 65 year old Caucasian female with  complaints of right-sided numbness with sudden headache approximately 48  hours ago.  The patient had gone to take a bath, and when she was  sitting in front of the bath she noticed right-sided facial numbness and  tingling with associated right leg weakness.  After dressing and walking  into the dining room, the patient felt the room shift.  The symptoms  went away after about five minutes, and the patient rested.  Fifteen  minutes later, the patient states that the symptoms returned with  continuous numbness and tingling in the right side of the face, right  side of her mouth, right eye, and right leg.  As a result, the patient  came to the emergency room.  She was able to bear weight on her right  leg and now has some residual tingling in her right hand and arm.  She  denied any chest pain, shortness of breath, nausea, vomiting, or  diaphoresis associated.  The patient has a known history of  hypercholesterolemia, and is seen by Dr. Eden Emms in the past for cardiac  workup with no ischemia noted.  We are asked to evaluate further for  probable carotid intervention.   PAST MEDICAL HISTORY:  1. Hypertension.  2. Osteoarthritis of the knees bilaterally.  3. GERD.  4. Carotid artery disease.  5. Hypercholesterolemia. (The patient was  part of the lipid clinic but      stopped taking medications since December 2007 secondary to      myalgia.).  6. Continued tobacco abuse.  7. Peptic ulcer disease.   PAST CARDIAC WORKUP:  Stress test in November 2006 with an EF of 69%,  mild anterior hypoperfusion, no large reversible defect to suggest  ischemia.   Echocardiogram dated April 25, 2004 revealed hyperdynamic left  ventricular function with an EF of 70 to 75% with diastolic dysfunction.  Mild mitral annular calcification was noted.   SOCIAL HISTORY:  The patient lives in [place] with her husband.  She  is employed by [employer].  She is married with no  children.  She has a 50-pack-year tobacco use and is now down to half a  pack a day.  Denies use of alcohol or drug use.   PAST SURGICAL HISTORY:  1. Sinus surgery.  2. Repair of her broken finger with some pinning.   FAMILY HISTORY:  Significant for COPD and diabetes  in her mother.  Her  mother is deceased.  Father died of a CVA.  She has a sister with COPD.  she has two brothers, one with hypercholesterolemia, another brother  with emphysema and a pacemaker.  She has three brothers, two of which  died of myocardial infarctions with history of CAD, and one brother died  in a motor vehicle accident.   CURRENT MEDICATIONS:  1. Protonix 40 mg daily.  2. Norvasc 5 mg daily.  3. Aspirin 325 mg daily.  4. Lovenox 40 mg subcutaneous daily.  5. Lunesta 2 mg p.o. q.h.s. p.r.n.   ALLERGIES:  1. SULFA CAUSING HIVES.  2. STATIN INTOLERANCE SECONDARY TO MYALGIAS.   CURRENT LABORATORY DATA:  Sodium 138, potassium 3.7, chloride 105, CO2  21, BUN 11, creatinine 0.8, glucose 77, hemoglobin 14.9, hematocrit  43.5, white blood cells 7.6, platelets 312.  VITAL SIGNS:  Blood pressure 127/71, pulse 81, respirations 16,  temperature 98.2, weight 156.7 pounds, O2 saturation 95% on room air.  HEENT:  Head is normocephalic and atraumatic.  Eyes:  PERRLA noted.   Exophthalmos bilaterally on the eyelids.  CARDIOVASCULAR:  Regular rate and rhythm without murmurs, rubs, or  gallops.  Pulses are 2+ bilaterally.  There are carotid bruits noted on  the right, negative on the left.  LUNGS:  Clear to auscultation.  ABDOMEN:  Soft, nontender with 2+ bowel sounds.  EXTREMITIES:  Without clubbing, cyanosis, or edema.  NEUROLOGIC:  The patient continues to complain of tingling in her  fingers of her right hand, but she has normal physical response, normal  hand grips and strength.  There is no gait disturbances noted.   Carotid Duplex revealed 60 to 80% stenosis bilaterally in the internal  carotid artery.   CT of the head showed old lacunar infarct.   Chest x-ray revealed chronic bronchitic changes and no acute findings.   MRI of the brain revealed no evidence of acute ischemia.   MRI of the head revealed a 2-mm x 2-mm out patching at the level of the  superior cerebral artery with questionable small aneurysm.   Cholesterol was found to be elevated at 303, lipids 247, HDL 34, LDL  220.   IMPRESSION:  1. Transient ischemic attack.  2. Carotid artery disease.  3. Hypertension.  4. Tobacco abuse.  5. Hypercholesterolemia, unable to tolerate statins without myalgias.   PLAN:  This is a 65 year old Caucasian female well-known to Dr. Eden Emms  who presented with evidence of TIA with some residual tingling in her  right arm.  She was found to have bilateral carotid artery disease.  She  has been seen and examined by Dr. Shawnie Pons and myself.  She has  significant hypercholesterolemia on previous lipid-lowering therapy but  did not tolerate.  The patient states she was able to tolerate Caduet  and did not receive any refills as she had gotten a good bit of samples  from Dr. Eden Emms on the last visit.   She had transient right facial numbness and right leg numbness. Examination is without significant murmur.  Echocardiogram revealed  hypodynamic  function, no acute EKG changes.  Dr. Riley Kill did talk with  Dr. Eden Emms and not certain that a TEE is warranted at this time because  transthoracic echocardiogram did not reveal any evidence of thrombus.  Dr. Eden Emms will see the patient in the morning and readdress this making  further recommendations if necessary.  It is our understanding that Dr.  Durwin Nora would like to proceed  with carotid intervention within the next 48  hours.  A question of restarting Caduet again might be an option at Dr.  Fabio Bering discretion.  We will make further recommendations after Dr.  Eden Emms sees the patient and allow for surgery.  The patient verbalizes  understanding.      Bettey Mare. Lyman Bishop, NP      Arturo Morton. Riley Kill, MD, [facility]  Electronically Signed    KML/MEDQ  D:  04/27/2006  T:  04/27/2006  Job:  [id number]   cc:   Lonzo Cloud. Kriste Basque, MD

## 2010-07-29 ENCOUNTER — Encounter: Payer: Self-pay | Admitting: Cardiovascular Disease

## 2010-08-01 ENCOUNTER — Other Ambulatory Visit: Payer: Self-pay | Admitting: *Deleted

## 2010-08-01 DIAGNOSIS — E7800 Pure hypercholesterolemia, unspecified: Secondary | ICD-10-CM

## 2010-08-01 DIAGNOSIS — Z79899 Other long term (current) drug therapy: Secondary | ICD-10-CM

## 2010-08-01 DIAGNOSIS — I6529 Occlusion and stenosis of unspecified carotid artery: Secondary | ICD-10-CM

## 2010-08-01 DIAGNOSIS — I10 Essential (primary) hypertension: Secondary | ICD-10-CM

## 2010-08-20 ENCOUNTER — Other Ambulatory Visit: Payer: Self-pay | Admitting: Cardiovascular Disease

## 2010-08-25 ENCOUNTER — Other Ambulatory Visit: Payer: Self-pay | Admitting: Cardiovascular Disease

## 2010-08-25 ENCOUNTER — Encounter: Payer: Self-pay | Admitting: Cardiovascular Disease

## 2010-08-25 ENCOUNTER — Other Ambulatory Visit (INDEPENDENT_AMBULATORY_CARE_PROVIDER_SITE_OTHER): Payer: Medicare Other | Admitting: *Deleted

## 2010-08-25 ENCOUNTER — Ambulatory Visit (INDEPENDENT_AMBULATORY_CARE_PROVIDER_SITE_OTHER): Payer: Medicare Other | Admitting: Cardiovascular Disease

## 2010-08-25 ENCOUNTER — Encounter (INDEPENDENT_AMBULATORY_CARE_PROVIDER_SITE_OTHER): Payer: Medicare Other | Admitting: *Deleted

## 2010-08-25 DIAGNOSIS — I6529 Occlusion and stenosis of unspecified carotid artery: Secondary | ICD-10-CM

## 2010-08-25 DIAGNOSIS — E7800 Pure hypercholesterolemia, unspecified: Secondary | ICD-10-CM

## 2010-08-25 DIAGNOSIS — G459 Transient cerebral ischemic attack, unspecified: Secondary | ICD-10-CM

## 2010-08-25 DIAGNOSIS — I10 Essential (primary) hypertension: Secondary | ICD-10-CM

## 2010-08-25 DIAGNOSIS — Z79899 Other long term (current) drug therapy: Secondary | ICD-10-CM

## 2010-08-25 LAB — HEPATIC FUNCTION PANEL
AST: 15 U/L (ref 0–37)
Albumin: 4.3 g/dL (ref 3.5–5.2)
Alkaline Phosphatase: 67 U/L (ref 39–117)
Total Protein: 6.8 g/dL (ref 6.0–8.3)

## 2010-08-25 LAB — LIPID PANEL: Cholesterol: 242 mg/dL — ABNORMAL HIGH (ref 0–200)

## 2010-08-25 LAB — LDL CHOLESTEROL, DIRECT: Direct LDL: 137.6 mg/dL

## 2010-08-25 LAB — BASIC METABOLIC PANEL
Calcium: 9 mg/dL (ref 8.4–10.5)
Creatinine, Ser: 0.8 mg/dL (ref 0.4–1.2)

## 2010-08-25 NOTE — Assessment & Plan Note (Signed)
F/U duplex in 6 months.  No TIA symptoms  Continue Plavix and ASA

## 2010-08-25 NOTE — Assessment & Plan Note (Signed)
Continue statin.  Labs drewn today.

## 2010-08-25 NOTE — Assessment & Plan Note (Signed)
Well controlled.  Continue current medications and low sodium Dash type diet.    

## 2010-08-25 NOTE — Patient Instructions (Addendum)
Your physician recommends that you schedule a follow-up appointment in: 6 MONTHS WITH DR [facility]  Your physician recommends that you continue on your current medications as directed. Please refer to the Current Medication list given to you today.  Your physician has requested that you have a carotid duplex. This test is an ultrasound of the carotid arteries in your neck. It looks at blood flow through these arteries that supply the brain with blood. Allow one hour for this exam. There are no restrictions or special instructions. 6 MONTHS  SEE DR Eden Emms  SAME DAY

## 2010-08-25 NOTE — Progress Notes (Signed)
[name] [name] is seen today for F/U of vascular disease. 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 I reveiwed her doppler with her from today. She had 60-79% bilateral ICA stenosis with previous bilateral  CEA . Duplex 7/30 today  She has stopped smoking. her Bp has been borderline. She will monitor at home and we may have to increase her Lisinopril to 20mg . 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 She does not have CAD and had a normal myovue 06/17/2006. Her LDL is 108 and she needs to have less fat in her diet which we discussed today  She will have lab work today. She is retired now and working for hospice   ROS: Denies fever, malais, weight loss, blurry vision, decreased visual acuity, cough, sputum, SOB, hemoptysis, pleuritic pain, palpitaitons, heartburn, abdominal pain, melena, lower extremity edema, claudication, or rash.  All other systems reviewed and negative  General: Affect appropriate Healthy:  appears stated age HEENT: normal Neck supple with no adenopathy JVP normal bilateral  bruits no thyromegaly Lungs clear with no wheezing and good diaphragmatic motion Heart:  S1/S2 no murmur,rub, gallop or click PMI normal Abdomen: benighn, BS positve, no tenderness, no AAA no bruit.  No HSM or HJR Distal pulses intact with no bruits No edema Neuro non-focal Skin warm and dry No muscular weakness   Current Outpatient Prescriptions  Medication Sig Dispense Refill  . aspirin 325 MG EC tablet Take 325 mg by mouth daily.        Marland Kitchen buPROPion (WELLBUTRIN XL) 300 MG 24 hr tablet Take 300 mg by mouth daily.        . clopidogrel (PLAVIX) 75 MG tablet Take 75 mg by mouth daily.        Marland Kitchen lisinopril (PRINIVIL,ZESTRIL) 10 MG tablet Take 10 mg by mouth daily.        . metoprolol (TOPROL-XL) 50 MG 24 hr tablet TAKE ONE TABLET BY MOUTH EVERY DAY  30 tablet  6  . omeprazole (PRILOSEC) 20 MG capsule Take 20 mg by mouth daily.        . rosuvastatin (CRESTOR) 40 MG tablet Take 40 mg by mouth  daily.        . traZODone (DESYREL) 50 MG tablet Take 25-50 mg by mouth at bedtime as needed.          Allergies  Sulfadiazine and Sulfamethoxazole  Electrocardiogram:  NSR 66 PR 212 otherwise normal  Assessment and Plan

## 2010-09-10 ENCOUNTER — Other Ambulatory Visit: Payer: Self-pay | Admitting: Cardiovascular Disease

## 2010-09-17 ENCOUNTER — Other Ambulatory Visit: Payer: Self-pay | Admitting: *Deleted

## 2010-09-17 MED ORDER — CLOPIDOGREL BISULFATE 75 MG PO TABS: 75.0000 mg | ORAL_TABLET | Freq: Every day | ORAL | Status: DC

## 2010-09-17 MED ORDER — OMEPRAZOLE 20 MG PO CPDR: 20.0000 mg | DELAYED_RELEASE_CAPSULE | Freq: Every day | ORAL | Status: DC

## 2010-09-17 MED ORDER — METOPROLOL SUCCINATE ER 50 MG PO TB24: 50.0000 mg | ORAL_TABLET | Freq: Every day | ORAL | Status: DC

## 2010-09-17 MED ORDER — LISINOPRIL 10 MG PO TABS: 10.0000 mg | ORAL_TABLET | Freq: Every day | ORAL | Status: DC

## 2010-09-17 MED ORDER — BUPROPION HCL ER (XL) 300 MG PO TB24: 300.0000 mg | ORAL_TABLET | Freq: Every day | ORAL | Status: DC

## 2010-10-24 LAB — CBC
MCHC: 33.8
MCHC: 34.6
MCV: 89.2
MCV: 90.9
Platelets: 274
RBC: 4.22
WBC: 6.4

## 2010-10-24 LAB — COMPREHENSIVE METABOLIC PANEL
AST: 18
Albumin: 4
Calcium: 9.2
Creatinine, Ser: 0.86
GFR calc Af Amer: >60
Sodium: 138

## 2010-10-24 LAB — URINALYSIS, ROUTINE W REFLEX MICROSCOPIC
Ketones, ur: NEGATIVE
Nitrite: NEGATIVE
Protein, ur: NEGATIVE
Urobilinogen, UA: 1

## 2010-10-24 LAB — BASIC METABOLIC PANEL
CO2: 25
Chloride: 103
Creatinine, Ser: 0.73
GFR calc Af Amer: >60

## 2010-10-24 LAB — TYPE AND SCREEN
ABO/RH(D): B NEG
Antibody Screen: NEGATIVE

## 2010-10-24 LAB — APTT: aPTT: 32

## 2010-12-19 ENCOUNTER — Other Ambulatory Visit: Payer: Self-pay

## 2011-01-21 ENCOUNTER — Other Ambulatory Visit: Payer: Self-pay | Admitting: *Deleted

## 2011-01-21 MED ORDER — OMEPRAZOLE 40 MG PO CPDR: 40.0000 mg | DELAYED_RELEASE_CAPSULE | Freq: Every day | ORAL | Status: DC

## 2011-01-27 DIAGNOSIS — Z8711 Personal history of peptic ulcer disease: Secondary | ICD-10-CM

## 2011-01-27 HISTORY — DX: Personal history of peptic ulcer disease: Z87.11

## 2011-01-27 LAB — HM COLONOSCOPY

## 2011-02-12 ENCOUNTER — Other Ambulatory Visit: Payer: Self-pay | Admitting: *Deleted

## 2011-02-12 DIAGNOSIS — E7800 Pure hypercholesterolemia, unspecified: Secondary | ICD-10-CM

## 2011-02-12 DIAGNOSIS — Z79899 Other long term (current) drug therapy: Secondary | ICD-10-CM

## 2011-03-06 ENCOUNTER — Other Ambulatory Visit: Payer: BC Managed Care – PPO | Admitting: *Deleted

## 2011-03-06 ENCOUNTER — Ambulatory Visit: Payer: BC Managed Care – PPO | Admitting: Cardiology

## 2011-03-06 ENCOUNTER — Encounter: Payer: BC Managed Care – PPO | Admitting: *Deleted

## 2011-04-03 ENCOUNTER — Ambulatory Visit (INDEPENDENT_AMBULATORY_CARE_PROVIDER_SITE_OTHER): Payer: Medicare Other | Admitting: Cardiology

## 2011-04-03 ENCOUNTER — Encounter (INDEPENDENT_AMBULATORY_CARE_PROVIDER_SITE_OTHER): Payer: Medicare Other

## 2011-04-03 ENCOUNTER — Other Ambulatory Visit (INDEPENDENT_AMBULATORY_CARE_PROVIDER_SITE_OTHER): Payer: Medicare Other

## 2011-04-03 ENCOUNTER — Encounter: Payer: Self-pay | Admitting: Cardiology

## 2011-04-03 DIAGNOSIS — Z79899 Other long term (current) drug therapy: Secondary | ICD-10-CM

## 2011-04-03 DIAGNOSIS — I6529 Occlusion and stenosis of unspecified carotid artery: Secondary | ICD-10-CM

## 2011-04-03 DIAGNOSIS — E785 Hyperlipidemia, unspecified: Secondary | ICD-10-CM

## 2011-04-03 DIAGNOSIS — E7800 Pure hypercholesterolemia, unspecified: Secondary | ICD-10-CM

## 2011-04-03 DIAGNOSIS — I10 Essential (primary) hypertension: Secondary | ICD-10-CM

## 2011-04-03 DIAGNOSIS — I679 Cerebrovascular disease, unspecified: Secondary | ICD-10-CM

## 2011-04-03 LAB — HEPATIC FUNCTION PANEL
AST: 18 U/L (ref 0–37)
Albumin: 4.1 g/dL (ref 3.5–5.2)
Alkaline Phosphatase: 66 U/L (ref 39–117)
Total Bilirubin: 0.2 mg/dL — ABNORMAL LOW (ref 0.3–1.2)

## 2011-04-03 LAB — LIPID PANEL
Cholesterol: 311 mg/dL — ABNORMAL HIGH (ref 0–200)
Total CHOL/HDL Ratio: 7

## 2011-04-03 LAB — BASIC METABOLIC PANEL
BUN: 20 mg/dL (ref 6–23)
CO2: 27 mEq/L (ref 19–32)
Calcium: 9.2 mg/dL (ref 8.4–10.5)
Creatinine, Ser: 1 mg/dL (ref 0.4–1.2)
Glucose, Bld: 99 mg/dL (ref 70–99)

## 2011-04-03 NOTE — Assessment & Plan Note (Signed)
Blood pressure controlled. Continue present medications. 

## 2011-04-03 NOTE — Assessment & Plan Note (Signed)
Intolerant to statins. Continue diet. 

## 2011-04-03 NOTE — Assessment & Plan Note (Signed)
Continue aspirin. She is intolerant to statins. Plan repeat carotid Dopplers September 2013.

## 2011-04-03 NOTE — Progress Notes (Signed)
   HPI: Pleasant female previously followed by Dr Eden Emms for F/U of vascular disease. 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 Last carotid Dopplers in July of 2012 showed 60-79% bilateral stenosis with followup recommended in 6 months. 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 She does not have CAD and had a normal myovue 06/17/2006. She was last seen in July of 2012. Since then, she denies dyspnea on exertion, orthopnea, PND, pedal edema, palpitations, syncope or chest pain.  Current Outpatient Prescriptions  Medication Sig Dispense Refill  . aspirin 325 MG EC tablet Take 325 mg by mouth daily.        Marland Kitchen buPROPion (WELLBUTRIN XL) 300 MG 24 hr tablet Take 1 tablet (300 mg total) by mouth daily.  90 tablet  4  . clopidogrel (PLAVIX) 75 MG tablet Take 1 tablet (75 mg total) by mouth daily.  90 tablet  4  . co-enzyme Q-10 30 MG capsule Take 30 mg by mouth daily.      Marland Kitchen lisinopril (PRINIVIL,ZESTRIL) 10 MG tablet Take 1 tablet (10 mg total) by mouth daily.  90 tablet  4  . metoprolol (TOPROL-XL) 50 MG 24 hr tablet Take 1 tablet (50 mg total) by mouth daily.  90 tablet  4  . NON FORMULARY Take 1 tablet by mouth daily. CHOLESOFF      . omeprazole (PRILOSEC) 40 MG capsule Take 1 capsule (40 mg total) by mouth daily.  90 capsule  4  . traZODone (DESYREL) 50 MG tablet Take 25-50 mg by mouth at bedtime as needed.           Past Medical History  Diagnosis Date  . HTN (hypertension)   . HLD (hyperlipidemia)   . Cerebrovascular disease, unspecified   . GERD (gastroesophageal reflux disease)   . Osteoarthrosis, unspecified whether generalized or localized, unspecified site   . TIA (transient ischemic attack)     Past Surgical History  Procedure Date  . Bilateral carotid endarderectomy   . Tonsillectomy     History   Social History  . Marital Status:  Widowed    Spouse Name: N/A    Number of Children: N/A  . Years of Education: N/A   Occupational History  . Not on file.   Social History Main Topics  . Smoking status: Former Smoker    Quit date: 04/02/2009  . Smokeless tobacco: Not on file  . Alcohol Use: No  . Drug Use: Not on file  . Sexually Active: Not on file   Other Topics Concern  . Not on file   Social History Narrative   Recent widow. Works for [employer].     ROS: Occasional dizziness and some problems with pain in stomach with eating but no fevers or chills, productive cough, hemoptysis, dysphasia, odynophagia, melena, hematochezia, dysuria, hematuria, rash, seizure activity, orthopnea, PND, pedal edema, claudication. Remaining systems are negative.  Physical Exam: Well-developed well-nourished in no acute distress.  Skin is warm and dry.  HEENT is normal.  Neck is supple. Bilateral carotid bruits Chest is clear to auscultation with normal expansion.  Cardiovascular exam is regular rate and rhythm. 2/6 systolic ejection murmur Abdominal exam nontender or distended. No masses palpated. Extremities show no edema. neuro grossly intact  ECG sinus rhythm at a rate of 77. No ST changes.

## 2011-04-03 NOTE — Patient Instructions (Signed)
Your physician wants you to follow-up in: ONE YEAR You will receive a reminder letter in the mail two months in advance. If you don't receive a letter, please call our office to schedule the follow-up appointment.  

## 2011-04-08 ENCOUNTER — Telehealth: Payer: Self-pay | Admitting: *Deleted

## 2011-04-08 NOTE — Telephone Encounter (Signed)
Spoke with pt, aware of lab results. appt made for lipid consult.

## 2011-04-13 ENCOUNTER — Ambulatory Visit: Payer: Medicare Other

## 2011-04-14 ENCOUNTER — Ambulatory Visit (INDEPENDENT_AMBULATORY_CARE_PROVIDER_SITE_OTHER): Payer: Medicare Other | Admitting: Pharmacist

## 2011-04-14 DIAGNOSIS — E7800 Pure hypercholesterolemia, unspecified: Secondary | ICD-10-CM

## 2011-04-14 MED ORDER — ROSUVASTATIN CALCIUM 5 MG PO TABS: ORAL_TABLET | ORAL | Status: DC

## 2011-04-14 NOTE — Patient Instructions (Signed)
Start Crestor 5mg  on Monday, Wednesday and Friday.  If you have any problems, please call Kennon Rounds at [phone].   Continue your heart healthy diet.   Try to start walking about 10-15 minutes 2-3 days per week   We will recheck your labs in 1 month.

## 2011-04-14 NOTE — Progress Notes (Signed)
HPI: [name] [name] is a pleasant 66yo F presenting for her hyperlipidemia appointment. Her PMH is significant for TIA and 60-79% bilateral stenosis of carotids. She is not currently on any medicine for her cholesterol. She has tried simva 40mg -80mg  (stopped3/2009), Crestor 20mg -40mg  (stopped 07/2008 and 08/2009), and Vytorin 10/40 (stopped 10/2008) and had myalgias with them. She has also tried Zetia, Lipitor, and Questran (dates unknown). She is presenting today to learn about a study with evacetrapib that she may be able to get on.  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  Diet: Breakfast: Never misses it. Eats cornflakes with bananas, grits, oatmeal, fruit, unfrosted pop-tarts, or eggs (only once a month). Drinks coffee and water (sometimes a Diet Dr Reino Kent); no juice because of heartburn. Lunch: Salad with [place] or [employer] or [employer] sandwich on wheat bread. Drinks water. Dinner: Must eat by 5pm. Canned vegetables, chicken, (usually deep-fried) seafood (shrimp, whitefish, flounder), [place], or hot dogs (boiled 3 times, eaten 1-2 times a month). If she doesn't eat dinner by 5pm, she'll have applesauce and cereal, jello, or fruit cups (peaches, pears). Snacks: Cheerios, Honey Grahams, wheat crackers, Ritz crackers, grapes, apples, or oranges. Sometimes craves chocolate but tries to eat it in moderation.  Exercise: Does not exercise routinely.  Current Outpatient Prescriptions on File Prior to Visit  Medication Sig Dispense Refill  . aspirin 325 MG EC tablet Take 325 mg by mouth daily.        Marland Kitchen buPROPion (WELLBUTRIN XL) 300 MG 24 hr tablet Take 1 tablet (300 mg total) by mouth daily.  90 tablet  4  . clopidogrel (PLAVIX) 75 MG tablet Take 1 tablet (75 mg total) by mouth daily.  90 tablet  4  . co-enzyme Q-10 30 MG capsule Take 30  mg by mouth daily.      Marland Kitchen lisinopril (PRINIVIL,ZESTRIL) 10 MG tablet Take 1 tablet (10 mg total) by mouth daily.  90 tablet  4  . metoprolol (TOPROL-XL) 50 MG 24 hr tablet Take 1 tablet (50 mg total) by mouth daily.  90 tablet  4  . NON FORMULARY Take 1 tablet by mouth daily. CHOLESOFF      . omeprazole (PRILOSEC) 40 MG capsule Take 1 capsule (40 mg total) by mouth daily.  90 capsule  4  . traZODone (DESYREL) 50 MG tablet Take 25-50 mg by mouth at bedtime as needed.          Allergies  Allergen Reactions  . Sulfadiazine   . Sulfamethoxazole     REACTION: unspecified

## 2011-04-14 NOTE — Assessment & Plan Note (Addendum)
Risk Assessment: Former smoker (quit 03/2009), age>55, on antihypertensives. FH of hypercholesterolemia, history of cerebrovascular disease and TIA Labs: TC 311, TG 299, HDL 47, LDL 205 Goals: TC<200, TG<150, HDL>45, LDL<70 Plan: 1. Start Crestor 5 mg MWF. Patient was given 1 month supply of samples. 2. Maintain healthy diet and continue improving as possible. 3. Start walking 2-3 times a week for 10-15 minutes. 4. Follow up in 1 month on April 30 10AM. Patient was given phone number to reach pharmacist before then if needed. 5. Once patient is on or has tried to be on a stable statin regimen, she may enroll into the evacetrapib study.

## 2011-04-16 ENCOUNTER — Encounter: Payer: Self-pay | Admitting: Internal Medicine

## 2011-05-20 ENCOUNTER — Other Ambulatory Visit (INDEPENDENT_AMBULATORY_CARE_PROVIDER_SITE_OTHER): Payer: Medicare Other

## 2011-05-20 DIAGNOSIS — E7800 Pure hypercholesterolemia, unspecified: Secondary | ICD-10-CM

## 2011-05-20 LAB — LIPID PANEL
Cholesterol: 279 mg/dL — ABNORMAL HIGH (ref 0–200)
Triglycerides: 252 mg/dL — ABNORMAL HIGH (ref 0.0–149.0)

## 2011-05-20 LAB — HEPATIC FUNCTION PANEL
ALT: 16 U/L (ref 0–35)
Albumin: 4.1 g/dL (ref 3.5–5.2)
Total Protein: 7.1 g/dL (ref 6.0–8.3)

## 2011-05-26 ENCOUNTER — Ambulatory Visit (INDEPENDENT_AMBULATORY_CARE_PROVIDER_SITE_OTHER): Payer: Medicare Other | Admitting: Pharmacist

## 2011-05-26 VITALS — BP 122/70 | Wt 144.0 lb

## 2011-05-26 DIAGNOSIS — E785 Hyperlipidemia, unspecified: Secondary | ICD-10-CM

## 2011-05-26 DIAGNOSIS — E7800 Pure hypercholesterolemia, unspecified: Secondary | ICD-10-CM

## 2011-05-26 MED ORDER — ROSUVASTATIN CALCIUM 5 MG PO TABS: 5.0000 mg | ORAL_TABLET | Freq: Every day | ORAL | Status: DC

## 2011-05-26 NOTE — Patient Instructions (Signed)
Start taking Crestor 5mg  daily.  Stop taking Cholestoff.  You may continue taking CoQ10.  Let us know if you are not tolerating the new Crestor dose. [phone]  We will see you back in 1 month to reevaluate study enrollment.

## 2011-05-26 NOTE — Assessment & Plan Note (Addendum)
CV Risk Assessment- Risk Factors: TIA, age, HTN, family history of high cholesterol TC goal <200, HDL goal >45, LDL goal <100 (<70 optimal), TG goal <150  All lipid markers have improved, but LDL & TG remain above goal.  Plan- 1. Start taking Crestor 5mg  daily. 2. Stop taking Cholestoff. 3. You may continue taking CoQ10. 4. Let us know if you are not tolerating the new Crestor dose. [phone]) 5. We will see you back in 1 month to reevaluate study enrollment.

## 2011-05-26 NOTE — Progress Notes (Signed)
Subjective- [name] [name] presented in good spirits for a 61-month follow up of her lipids. Her PMH is significant for TIA and 60-79% bilateral stenosis of carotids. She continues on Crestor 5mg  MWF without complaints of muscle aches or cramps.  At her last visit, we discussed possible enrollment in an evacetrepib study, but that it requires patients be on a maximally tolerated dose of a statin for 30 days.  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  Exercise: She is trying to walk more frequently and often parks at the far end of the lot.  Objective- Lipid Panel     Component Value Date/Time   CHOL 279* 05/20/2011 0857   TRIG 252.0* 05/20/2011 0857   HDL 53.60 05/20/2011 0857   CHOLHDL 5 05/20/2011 0857   VLDL 50.4* 05/20/2011 0857    Current Outpatient Prescriptions on File Prior to Visit  Medication Sig Dispense Refill  . aspirin 325 MG EC tablet Take 325 mg by mouth daily.        Marland Kitchen buPROPion (WELLBUTRIN XL) 300 MG 24 hr tablet Take 1 tablet (300 mg total) by mouth daily.  90 tablet  4  . clopidogrel (PLAVIX) 75 MG tablet Take 1 tablet (75 mg total) by mouth daily.  90 tablet  4  . co-enzyme Q-10 30 MG capsule Take 30 mg by mouth daily.      Marland Kitchen lisinopril (PRINIVIL,ZESTRIL) 10 MG tablet Take 1 tablet (10 mg total) by mouth daily.  90 tablet  4  . metoprolol (TOPROL-XL) 50 MG 24 hr tablet Take 1 tablet (50 mg total) by mouth daily.  90 tablet  4  . omeprazole (PRILOSEC) 40 MG capsule Take 1 capsule (40 mg total) by mouth daily.  90 capsule  4  . traZODone (DESYREL) 50 MG tablet Take 25-50 mg by mouth at bedtime as needed.        Marland Kitchen DISCONTD: rosuvastatin (CRESTOR) 5 MG tablet Take 1 tablet by mouth on  Monday, Wednesday and Friday.  14 tablet  0  . NON FORMULARY Take 1 tablet by mouth daily. Cholestoff (OTC)

## 2011-06-02 ENCOUNTER — Other Ambulatory Visit: Payer: Self-pay | Admitting: Pharmacist

## 2011-06-02 DIAGNOSIS — E785 Hyperlipidemia, unspecified: Secondary | ICD-10-CM

## 2011-06-02 MED ORDER — ROSUVASTATIN CALCIUM 5 MG PO TABS: 5.0000 mg | ORAL_TABLET | Freq: Every day | ORAL | Status: DC

## 2011-06-03 ENCOUNTER — Other Ambulatory Visit: Payer: Self-pay | Admitting: Cardiovascular Disease

## 2011-06-04 ENCOUNTER — Encounter: Payer: Self-pay | Admitting: *Deleted

## 2011-06-08 ENCOUNTER — Other Ambulatory Visit: Payer: Self-pay | Admitting: Cardiology

## 2011-06-08 DIAGNOSIS — E785 Hyperlipidemia, unspecified: Secondary | ICD-10-CM

## 2011-06-08 MED ORDER — ROSUVASTATIN CALCIUM 5 MG PO TABS: 5.0000 mg | ORAL_TABLET | Freq: Every day | ORAL | Status: DC

## 2011-06-17 ENCOUNTER — Ambulatory Visit (INDEPENDENT_AMBULATORY_CARE_PROVIDER_SITE_OTHER): Payer: Medicare Other | Admitting: Internal Medicine

## 2011-06-17 ENCOUNTER — Encounter: Payer: Self-pay | Admitting: Internal Medicine

## 2011-06-17 ENCOUNTER — Other Ambulatory Visit (INDEPENDENT_AMBULATORY_CARE_PROVIDER_SITE_OTHER): Payer: Medicare Other

## 2011-06-17 VITALS — BP 134/68 | HR 74 | Ht 63.0 in | Wt 144.0 lb

## 2011-06-17 DIAGNOSIS — K219 Gastro-esophageal reflux disease without esophagitis: Secondary | ICD-10-CM

## 2011-06-17 DIAGNOSIS — R109 Unspecified abdominal pain: Secondary | ICD-10-CM

## 2011-06-17 DIAGNOSIS — R634 Abnormal weight loss: Secondary | ICD-10-CM

## 2011-06-17 LAB — COMPREHENSIVE METABOLIC PANEL
BUN: 13 mg/dL (ref 6–23)
CO2: 28 mEq/L (ref 19–32)
Calcium: 9.3 mg/dL (ref 8.4–10.5)
Chloride: 104 mEq/L (ref 96–112)
Creatinine, Ser: 0.9 mg/dL (ref 0.4–1.2)
GFR: 68.07 mL/min (ref >60.00)

## 2011-06-17 LAB — CBC WITH DIFFERENTIAL/PLATELET
Basophils Absolute: 0 10*3/uL (ref 0.0–0.1)
Basophils Relative: 0.6 % (ref 0.0–3.0)
Hemoglobin: 13.7 g/dL (ref 12.0–15.0)
Lymphocytes Relative: 30.2 % (ref 12.0–46.0)
Monocytes Relative: 6.6 % (ref 3.0–12.0)
Neutro Abs: 3.3 10*3/uL (ref 1.4–7.7)
Neutrophils Relative %: 60.9 % (ref 43.0–77.0)
RBC: 4.52 Mil/uL (ref 3.87–5.11)
RDW: 13 % (ref 11.5–14.6)

## 2011-06-17 MED ORDER — PEG-KCL-NACL-NASULF-NA ASC-C 100 G PO SOLR: 1.0000 | Freq: Once | ORAL | Status: DC

## 2011-06-17 NOTE — Patient Instructions (Signed)
You have been scheduled for an endoscopy and colonoscopy with propofol. Please follow the written instructions given to you at your visit today. Please pick up your prep at the pharmacy within the next 1-3 days. Your physician has requested that you go to the basement for the following lab work before leaving today: CMET, CBC, Amylase, Lipase CC: Dr Birdie Sons

## 2011-06-17 NOTE — Progress Notes (Signed)
[name] [name] 06/25/1945 MRN [id number]  History of Present Illness:  This is a 66 year old, white female with epigastric pain associated with meals. Within minutes of starting to eat, she develops subxiphoid and epigastric pain even with liquids. The pain does not radiate to her back. It subsides after a while. The pain does not wake her up at night. There has been no heartburn, nausea or vomiting. She has lost about 6 pounds. She has carotid artery disease and is status post TIA. She has been on Plavix 75 mg a day. She used to take Nexium 40 mg a day which was given by Dr.[place] after an upper endoscopy in 2003 which showed a hiatal hernia and mild esophageal stricture. She is currently on Prilosec 40 mg a day without any improvement of the pain. She never had a full colonoscopy. She takes 3-4 Aleve a day for joint pains. Patient does not smoke, does not drink alcohol, and drinks decaffeinated coffee. There is a history of a bleeding ulcer at least 40 years ago. We do not have that documentation.     Past Medical History  Diagnosis Date  . HTN (hypertension)   . HLD (hyperlipidemia)   . Cerebrovascular disease, unspecified   . GERD (gastroesophageal reflux disease)   . Osteoarthrosis, unspecified whether generalized or localized, unspecified site   . TIA (transient ischemic attack)   . IBS (irritable bowel syndrome)   . Anxiety   . Hiatal hernia   . Chronic gastritis   . Carotid stenosis    Past Surgical History  Procedure Date  . Bilateral carotid endarderectomy   . Tonsillectomy   . Sinus surgery with instatrak   . Finger surgery     reports that she quit smoking about 2 years ago. She has never used smokeless tobacco. She reports that she does not drink alcohol or use illicit drugs. family history includes Diabetes in an unspecified family member; Heart disease in her brother; and Stroke in her father.  There is no history of Colon cancer. Allergies  Allergen Reactions  .  Sulfadiazine   . Sulfamethoxazole     REACTION: unspecified        Review of Systems:Negative for heartburn dysphagia or shortness of breath or chest pain  The remainder of the 10 point ROS is negative except as outlined in H&P   Physical Exam: General appearance  Well developed, in no distress. Eyes- non icteric. HEENT nontraumatic, normocephalic. Mouth no lesions, tongue papillated, no cheilosis. Neck supple without adenopathy, thyroid not enlarged, bilateral  carotid bruits, no JVD. Lungs Clear to auscultation bilaterally. Cor normal S1, normal S2, regular rhythm, no murmur,  quiet precordium. Abdomen: Soft diffusely tender more so in epigastrium and subxiphoid area. Liver edge at costal margin. No abdominal bruits  Rectal:Soft Hemoccult negative stool  Extremities no pedal edema. Skin no lesions. Neurological alert and oriented x 3. Psychological normal mood and affect.  Assessment and Plan:  Moderately severe epigastric pain associated with meals. Rule out Aleve associated gastric ulcer , rule out gastritis. Hiatal hernia. Rule out intestinal angina. Rule out biliary cause. Upper abdominal ultrasound in 1999 was normal. We will proceed with diagnostic workup starting with upper endoscopy and appropriate biopsies. We have given her samples of Nexium 40 mg daily. She will stop taking Aleve.  Colorectal screening. She is due for recall colonoscopy. She never had one. Also she is having weight loss and hemorrhoidal problem. Flexible sigmoidoscopy in 1999 showed internal hemorrhoids. Endoscopy and colonoscopy will be  done on Plavix  Weight loss of 6 pounds. Rule out ischemic bowel disease She already has bilateral carotid disease. Consider CT angiogram to look for major vascular occlusion.   06/17/2011 Lina Sar

## 2011-06-19 ENCOUNTER — Encounter: Payer: Self-pay | Admitting: *Deleted

## 2011-06-19 ENCOUNTER — Ambulatory Visit (AMBULATORY_SURGERY_CENTER): Payer: Medicare Other | Admitting: Internal Medicine

## 2011-06-19 ENCOUNTER — Encounter: Payer: Self-pay | Admitting: Internal Medicine

## 2011-06-19 VITALS — BP 124/100 | HR 73 | Temp 98.5°F | Resp 18 | Ht 63.0 in | Wt 144.0 lb

## 2011-06-19 DIAGNOSIS — K219 Gastro-esophageal reflux disease without esophagitis: Secondary | ICD-10-CM

## 2011-06-19 DIAGNOSIS — K259 Gastric ulcer, unspecified as acute or chronic, without hemorrhage or perforation: Secondary | ICD-10-CM

## 2011-06-19 DIAGNOSIS — R1013 Epigastric pain: Secondary | ICD-10-CM

## 2011-06-19 DIAGNOSIS — R109 Unspecified abdominal pain: Secondary | ICD-10-CM

## 2011-06-19 DIAGNOSIS — Z1211 Encounter for screening for malignant neoplasm of colon: Secondary | ICD-10-CM

## 2011-06-19 DIAGNOSIS — K253 Acute gastric ulcer without hemorrhage or perforation: Secondary | ICD-10-CM

## 2011-06-19 MED ORDER — GLYCOPYRROLATE 2 MG PO TABS: 2.0000 mg | ORAL_TABLET | Freq: Two times a day (BID) | ORAL | Status: DC

## 2011-06-19 MED ORDER — HYDROCORTISONE ACE-PRAMOXINE 2.5-1 % RE CREA: TOPICAL_CREAM | Freq: Two times a day (BID) | RECTAL | Status: AC

## 2011-06-19 MED ORDER — SODIUM CHLORIDE 0.9 % IV SOLN: 500.0000 mL | INTRAVENOUS | Status: DC

## 2011-06-19 NOTE — Patient Instructions (Addendum)
YOU HAD AN ENDOSCOPIC PROCEDURE TODAY AT THE [facility]: Refer to the procedure report that was given to you for any specific questions about what was found during the examination.  If the procedure report does not answer your questions, please call your gastroenterologist to clarify.  If you requested that your care partner not be given the details of your procedure findings, then the procedure report has been included in a sealed envelope for you to review at your convenience later.  YOU SHOULD EXPECT: Some feelings of bloating in the abdomen. Passage of more gas than usual.  Walking can help get rid of the air that was put into your GI tract during the procedure and reduce the bloating. If you had a lower endoscopy (such as a colonoscopy or flexible sigmoidoscopy) you may notice spotting of blood in your stool or on the toilet paper. If you underwent a bowel prep for your procedure, then you may not have a normal bowel movement for a few days.  DIET: Your first meal following the procedure should be a light meal and then it is ok to progress to your normal diet.  A half-sandwich or bowl of soup is an example of a good first meal.  Heavy or fried foods are harder to digest and may make you feel nauseous or bloated.  Likewise meals heavy in dairy and vegetables can cause extra gas to form and this can also increase the bloating.  Drink plenty of fluids but you should avoid alcoholic beverages for 24 hours.  ACTIVITY: Your care partner should take you home directly after the procedure.  You should plan to take it easy, moving slowly for the rest of the day.  You can resume normal activity the day after the procedure however you should NOT DRIVE or use heavy machinery for 24 hours (because of the sedation medicines used during the test).    SEE HANDOUTS FOR HEMORRHOIDS AND HIGH FIBER DIET. TRY TO FOLLOW HIGH FIBER DIET. CONTINUE PLAVIX. TAKE NEW PRESCRIPTIONS AS DIRECTED.  SYMPTOMS TO REPORT  IMMEDIATELY: A gastroenterologist can be reached at any hour.  During normal business hours, 8:30 AM to 5:00 PM Monday through Friday, call [phone].  After hours and on weekends, please call the GI answering service at [phone] who will take a message and have the physician on call contact you.   Following lower endoscopy (colonoscopy or flexible sigmoidoscopy):  Excessive amounts of blood in the stool  Significant tenderness or worsening of abdominal pains  Swelling of the abdomen that is new, acute  Fever of 100F or higher  Following upper endoscopy (EGD)  Vomiting of blood or coffee ground material  New chest pain or pain under the shoulder blades  Painful or persistently difficult swallowing  New shortness of breath  Fever of 100F or higher  Black, tarry-looking stools  FOLLOW UP: If any biopsies were taken you will be contacted by phone or by letter within the next 1-3 weeks.  Call your gastroenterologist if you have not heard about the biopsies in 3 weeks.  Our staff will call the home number listed on your records the next business day following your procedure to check on you and address any questions or concerns that you may have at that time regarding the information given to you following your procedure. This is a courtesy call and so if there is no answer at the home number and we have not heard from you through the emergency physician  on call, we will assume that you have returned to your regular daily activities without incident.  SIGNATURES/CONFIDENTIALITY: You and/or your care partner have signed paperwork which will be entered into your electronic medical record.  These signatures attest to the fact that that the information above on your After Visit Summary has been reviewed and is understood.  Full responsibility of the confidentiality of this discharge information lies with you and/or your care-partner.    OFFICE VISIT WITH DR.BRODIE ON July 2,2013 Tuesday  AT 9:45 A.M. ($50 NO SHOW FEE EXPLAINED.)

## 2011-06-19 NOTE — Op Note (Signed)
[facility] [address] [place], [place]  [address]  COLONOSCOPY PROCEDURE REPORT  PATIENT:  [name], [name]  MR#:  [id number] BIRTHDATE:  1945-04-27, 66 yrs. old  GENDER:  female ENDOSCOPIST:  Hedwig Morton. Juanda Chance, MD REF. BY:  Birdie Sons, M.D. PROCEDURE DATE:  06/19/2011 PROCEDURE:  Colonoscopy 19147 ASA CLASS:  Class III INDICATIONS:  Abdominal pain, colorectal cancer screening, average risk, weight loss 6 lb weight loss, epig. pain MEDICATIONS:   MAC sedation, administered by CRNA, propofol (Diprivan) 70 mg  DESCRIPTION OF PROCEDURE:   After the risks and benefits and of the procedure were explained, informed consent was obtained. Digital rectal exam was performed and revealed no rectal masses. The LB CF-H180AL [id number] endoscope was introduced through the anus and advanced to the cecum, which was identified by both the appendix and ileocecal valve.  The quality of the prep was good, using MoviPrep.  The instrument was then slowly withdrawn as the colon was fully examined. <<PROCEDUREIMAGES>>  FINDINGS:  Internal Hemorrhoids were found (see image5 and image4).  This was otherwise a normal examination of the colon (see image6, image3, image2, and image1).   Retroflexed views in the rectum revealed no abnormalities.    The scope was then withdrawn from the patient and the procedure completed.  COMPLICATIONS:  None ENDOSCOPIC IMPRESSION: 1) Internal hemorrhoids 2) Otherwise normal examination RECOMMENDATIONS: 1) High fiber diet. continue Plavix Bentyl 10 mg po tis ac OV 4-6 weeks consider CT angiogram  REPEAT EXAM:  In 10 year(s) for.  ______________________________ Hedwig Morton. Juanda Chance, MD  CC:  n. eSIGNED:   Hedwig Morton. Fernando Stoiber at 06/19/2011 10:38 AM  Jolene Provost, [phone]

## 2011-06-19 NOTE — Progress Notes (Signed)
Patient did not experience any of the following events: a burn prior to discharge; a fall within the facility; wrong site/side/patient/procedure/implant event; or a hospital transfer or hospital admission upon discharge from the facility. (G8907) Patient did not have preoperative order for IV antibiotic SSI prophylaxis. (G8918)  

## 2011-06-19 NOTE — Op Note (Signed)
[facility] [address] [place], [place]  [address]  ENDOSCOPY PROCEDURE REPORT  PATIENT:  [name], [name]  MR#:  [id number] BIRTHDATE:  Mar 19, 1945, 66 yrs. old  GENDER:  female  ENDOSCOPIST:  Hedwig Morton. Juanda Chance, MD Referred by:  Birdie Sons, M.D.  PROCEDURE DATE:  06/19/2011 PROCEDURE:  EGD with biopsy, 43239 ASA CLASS:  Class III INDICATIONS:  pt on Plavix for carotid artery disease epig. pain with meals, hx of bleeding ulcer weight loss  MEDICATIONS:   MAC sedation, administered by CRNA, propofol (Diprivan) 140 mg TOPICAL ANESTHETIC:  none  DESCRIPTION OF PROCEDURE:   After the risks benefits and alternatives of the procedure were thoroughly explained, informed consent was obtained.  The LB GIF-H180 [id number] endoscope was introduced through the mouth and advanced to the second portion of the duodenum, without limitations.  The instrument was slowly withdrawn as the mucosa was fully examined. <<PROCEDUREIMAGES>>  An ulcer was found in the antrum. 5 mm umbilicated ulcer with raised edges, soft, not friable With standard forceps, a biopsy was obtained and sent to pathology (see image3).  Otherwise the examination was normal (see image1, image2, image4, image5, image6, image7, image8, and image9).    Retroflexed views revealed no abnormalities.    The scope was then withdrawn from the patient and the procedure completed.  COMPLICATIONS:  None  ENDOSCOPIC IMPRESSION: 1) Ulcer in the antrum 2) Otherwise normal examination small ulcer, s/p biopsies, doubt causing abd. pain RECOMMENDATIONS: 1) Await pathology results add Bentyl 10 mg berfore meals, colonoscopy  REPEAT EXAM:  In 0 year(s) for.  ______________________________ Hedwig Morton. Juanda Chance, MD  CC:  n. eSIGNED:   Hedwig Morton. Joeann Steppe at 06/19/2011 10:33 AM  Jolene Provost, [phone]

## 2011-06-23 ENCOUNTER — Telehealth: Payer: Self-pay

## 2011-06-23 NOTE — Telephone Encounter (Signed)
Left message on answering machine. 

## 2011-06-25 ENCOUNTER — Encounter: Payer: Self-pay | Admitting: Internal Medicine

## 2011-06-29 ENCOUNTER — Other Ambulatory Visit: Payer: Self-pay | Admitting: Pharmacist

## 2011-06-29 DIAGNOSIS — E785 Hyperlipidemia, unspecified: Secondary | ICD-10-CM

## 2011-07-03 ENCOUNTER — Other Ambulatory Visit (INDEPENDENT_AMBULATORY_CARE_PROVIDER_SITE_OTHER): Payer: Medicare Other

## 2011-07-03 DIAGNOSIS — E785 Hyperlipidemia, unspecified: Secondary | ICD-10-CM

## 2011-07-03 LAB — HEPATIC FUNCTION PANEL
ALT: 11 U/L (ref 0–35)
AST: 17 U/L (ref 0–37)
Albumin: 4 g/dL (ref 3.5–5.2)
Alkaline Phosphatase: 70 U/L (ref 39–117)
Total Protein: 6.7 g/dL (ref 6.0–8.3)

## 2011-07-07 ENCOUNTER — Ambulatory Visit (INDEPENDENT_AMBULATORY_CARE_PROVIDER_SITE_OTHER): Payer: Medicare Other | Admitting: Pharmacist

## 2011-07-07 VITALS — Wt 146.5 lb

## 2011-07-07 DIAGNOSIS — E785 Hyperlipidemia, unspecified: Secondary | ICD-10-CM

## 2011-07-07 DIAGNOSIS — R0989 Other specified symptoms and signs involving the circulatory and respiratory systems: Secondary | ICD-10-CM

## 2011-07-07 NOTE — Assessment & Plan Note (Signed)
Lipids significantly improved with Crestor 5mg  daily.  TC- 170 (goal<200), TG- 167 (goal<150), HDL- 49 (goal>45), LDL- 87 (goal<70).  LFTs are WNL.  Victorino Dike, RN with research spoke with pt.  She is willing to enroll in the Accelerate study at this time.  She will meet with research later this week for her enrollment visit.  We will follow up if needed after she has finished the clinical trial.

## 2011-07-07 NOTE — Progress Notes (Signed)
Subjective- Mrs. Pulice presented in good spirits for a 25-month follow up of her lipids. Her PMH is significant for TIA and 60-79% bilateral stenosis of carotids. She continues on Crestor 5mg  daily without complaints of muscle aches or cramps.  Since she has tolerated Crestor for the past 2 months, we will discuss enrollment to the Accelerate study with the research team today.   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  Exercise: She is trying to walk more frequently.  She is walking 30-35 minutes most days now.   Objective- Lipid Panel     Component Value Date/Time   CHOL 170 07/03/2011 0847   TRIG 167.0* 07/03/2011 0847   HDL 49.30 07/03/2011 0847   CHOLHDL 3 07/03/2011 0847   VLDL 33.4 07/03/2011 0847   LDLCALC 87 07/03/2011 0847    Current Outpatient Prescriptions on File Prior to Visit  Medication Sig Dispense Refill  . aspirin 325 MG EC tablet Take 325 mg by mouth daily.        Marland Kitchen buPROPion (WELLBUTRIN XL) 300 MG 24 hr tablet Take 1 tablet (300 mg total) by mouth daily.  90 tablet  4  . clopidogrel (PLAVIX) 75 MG tablet Take 1 tablet (75 mg total) by mouth daily.  90 tablet  4  . co-enzyme Q-10 30 MG capsule Take 30 mg by mouth daily.      Marland Kitchen glycopyrrolate (ROBINUL) 2 MG tablet Take 1 tablet (2 mg total) by mouth 2 (two) times daily.  60 tablet  1  . lisinopril (PRINIVIL,ZESTRIL) 10 MG tablet Take 1 tablet (10 mg total) by mouth daily.  90 tablet  4  . metoprolol (TOPROL-XL) 50 MG 24 hr tablet Take 1 tablet (50 mg total) by mouth daily.  90 tablet  4  . NON FORMULARY Take 1 tablet by mouth daily. Cholestoff (OTC) ON HOLD      . omeprazole (PRILOSEC) 40 MG capsule Take 1 capsule (40 mg total) by  mouth daily.  90 capsule  4  . rosuvastatin (CRESTOR) 5 MG tablet Take 1 tablet (5 mg total) by mouth daily.  90 tablet  3  . traZODone (DESYREL) 50 MG tablet Take 25-50 mg by mouth at bedtime as needed.

## 2011-07-08 ENCOUNTER — Encounter: Payer: Self-pay | Admitting: *Deleted

## 2011-07-28 ENCOUNTER — Ambulatory Visit (INDEPENDENT_AMBULATORY_CARE_PROVIDER_SITE_OTHER): Payer: Medicare Other | Admitting: Internal Medicine

## 2011-07-28 ENCOUNTER — Encounter: Payer: Self-pay | Admitting: Internal Medicine

## 2011-07-28 VITALS — BP 124/72 | HR 64 | Ht 63.0 in | Wt 148.0 lb

## 2011-07-28 DIAGNOSIS — K259 Gastric ulcer, unspecified as acute or chronic, without hemorrhage or perforation: Secondary | ICD-10-CM

## 2011-07-28 MED ORDER — OMEPRAZOLE 40 MG PO CPDR: 40.0000 mg | DELAYED_RELEASE_CAPSULE | Freq: Every day | ORAL | Status: DC

## 2011-07-28 NOTE — Patient Instructions (Addendum)
We have sent the following medications to your pharmacy for you to pick up at your convenience: Prilosec The phone number to [facility] is [phone]. There are multiple providers available including: Rut[facility] Mannan Amy [facility] Please follow up with Dr Juanda Chance in 6 months.

## 2011-07-28 NOTE — Progress Notes (Signed)
[name] [name] 1945/05/10 MRN [id number]  History of Present Illness:  This is a 66 year old white female who is status post upper endoscopy and colonoscopy last month which showed a small antral ulcer likely due to Aleve. She has been doing much better on Prilosec 40 mg daily and Robinul 2 mg twice a day. She has been on Plavix for a TIA and carotid artery disease.   Past Medical History  Diagnosis Date  . HTN (hypertension)   . HLD (hyperlipidemia)   . Cerebrovascular disease, unspecified   . GERD (gastroesophageal reflux disease)   . Osteoarthrosis, unspecified whether generalized or localized, unspecified site   . TIA (transient ischemic attack)   . IBS (irritable bowel syndrome)   . Anxiety   . Hiatal hernia   . Chronic gastritis   . Carotid stenosis   . Gastric ulcer   . Internal hemorrhoids    Past Surgical History  Procedure Date  . Bilateral carotid endarderectomy   . Tonsillectomy   . Sinus surgery with instatrak   . Finger surgery     reports that she quit smoking about 2 years ago. She has never used smokeless tobacco. She reports that she does not drink alcohol or use illicit drugs. family history includes Diabetes in her mother and unspecified family member; Heart disease in her brother; and Stroke in her father.  There is no history of Colon cancer. Allergies  Allergen Reactions  . Sulfadiazine   . Sulfamethoxazole     REACTION: unspecified        Review of Systems: Occasional early satiety and abdominal fullness  The remainder of the 10 point ROS is negative except as outlined in H&P   Physical Exam: General appearance  Well developed, in no distress. Eyes- non icteric. HEENT nontraumatic, normocephalic. Mouth no lesions, tongue papillated, no cheilosis. Neck supple without adenopathy, thyroid not enlarged, no carotid bruits, no JVD. Lungs Clear to auscultation bilaterally. Cor normal S1, normal S2, regular rhythm, no murmur,  quiet  precordium. Abdomen: Soft abdomen. Normal active bowel sounds. Mild tenderness in epigastric area. No distention. Liver edge at costal margin. Rectal: Not repeated. Extremities no pedal edema. Skin no lesions. Neurological alert and oriented x 3. Psychological normal mood and affect.  Assessment and Plan:  Problem #1 Epigastric abdominal pain markedly improved after a diagnosis of antral ulcer and treatment with Prilosec 40 mg daily. She was H. pylori negative. She will continue on the same regimen and return in 6 months. She would like to switch to a primary care physician closer to her home and I have suggested the [facility] office.   07/28/2011 Lina Sar

## 2011-09-15 ENCOUNTER — Other Ambulatory Visit: Payer: Self-pay | Admitting: *Deleted

## 2011-09-15 DIAGNOSIS — E785 Hyperlipidemia, unspecified: Secondary | ICD-10-CM

## 2011-09-15 MED ORDER — LISINOPRIL 10 MG PO TABS: 10.0000 mg | ORAL_TABLET | Freq: Every day | ORAL | Status: DC

## 2011-09-15 MED ORDER — CLOPIDOGREL BISULFATE 75 MG PO TABS: 75.0000 mg | ORAL_TABLET | Freq: Every day | ORAL | Status: DC

## 2011-09-15 MED ORDER — TRAZODONE HCL 50 MG PO TABS: 25.0000 mg | ORAL_TABLET | Freq: Every evening | ORAL | Status: DC | PRN

## 2011-09-15 MED ORDER — METOPROLOL SUCCINATE ER 50 MG PO TB24: 50.0000 mg | ORAL_TABLET | Freq: Every day | ORAL | Status: DC

## 2011-09-15 MED ORDER — OMEPRAZOLE 40 MG PO CPDR: 40.0000 mg | DELAYED_RELEASE_CAPSULE | Freq: Every day | ORAL | Status: DC

## 2011-09-15 MED ORDER — ROSUVASTATIN CALCIUM 5 MG PO TABS: 5.0000 mg | ORAL_TABLET | Freq: Every day | ORAL | Status: DC

## 2011-09-15 MED ORDER — BUPROPION HCL ER (XL) 300 MG PO TB24: 300.0000 mg | ORAL_TABLET | Freq: Every day | ORAL | Status: DC

## 2012-08-19 ENCOUNTER — Encounter (INDEPENDENT_AMBULATORY_CARE_PROVIDER_SITE_OTHER): Payer: Self-pay

## 2012-08-19 DIAGNOSIS — R0989 Other specified symptoms and signs involving the circulatory and respiratory systems: Secondary | ICD-10-CM

## 2012-09-16 ENCOUNTER — Other Ambulatory Visit: Payer: Self-pay | Admitting: Cardiology

## 2012-09-24 ENCOUNTER — Other Ambulatory Visit: Payer: Self-pay | Admitting: Cardiology

## 2012-10-15 ENCOUNTER — Other Ambulatory Visit: Payer: Self-pay | Admitting: Internal Medicine

## 2012-11-14 ENCOUNTER — Telehealth: Payer: Self-pay | Admitting: Cardiology

## 2012-11-14 DIAGNOSIS — I10 Essential (primary) hypertension: Secondary | ICD-10-CM

## 2012-11-14 DIAGNOSIS — I2510 Atherosclerotic heart disease of native coronary artery without angina pectoris: Secondary | ICD-10-CM

## 2012-11-14 DIAGNOSIS — E785 Hyperlipidemia, unspecified: Secondary | ICD-10-CM

## 2012-11-14 NOTE — Telephone Encounter (Signed)
New problem  Pt states she normally has a stress and echo and an OV completed yearly. She requests a call back to discuss if the test are needed this year.Marland Kitchen

## 2012-11-15 MED ORDER — METOPROLOL SUCCINATE ER 50 MG PO TB24: 50.0000 mg | ORAL_TABLET | Freq: Every day | ORAL | Status: DC

## 2012-11-15 MED ORDER — BUPROPION HCL ER (XL) 300 MG PO TB24: 300.0000 mg | ORAL_TABLET | Freq: Every day | ORAL | Status: DC

## 2012-11-15 MED ORDER — OMEPRAZOLE 40 MG PO CPDR: 40.0000 mg | DELAYED_RELEASE_CAPSULE | Freq: Every day | ORAL | Status: DC

## 2012-11-15 MED ORDER — LISINOPRIL 10 MG PO TABS: 10.0000 mg | ORAL_TABLET | Freq: Every day | ORAL | Status: DC

## 2012-11-15 MED ORDER — CLOPIDOGREL BISULFATE 75 MG PO TABS: 75.0000 mg | ORAL_TABLET | Freq: Every day | ORAL | Status: DC

## 2012-11-15 NOTE — Telephone Encounter (Signed)
Spoke with pt, carotid and office visit scheduled

## 2012-12-20 ENCOUNTER — Encounter: Payer: Self-pay | Admitting: Cardiology

## 2012-12-20 ENCOUNTER — Encounter (INDEPENDENT_AMBULATORY_CARE_PROVIDER_SITE_OTHER): Payer: Self-pay

## 2012-12-20 ENCOUNTER — Ambulatory Visit (INDEPENDENT_AMBULATORY_CARE_PROVIDER_SITE_OTHER): Payer: Medicare Other

## 2012-12-20 ENCOUNTER — Ambulatory Visit (HOSPITAL_COMMUNITY): Payer: Medicare Other | Attending: Cardiology

## 2012-12-20 ENCOUNTER — Encounter (HOSPITAL_COMMUNITY): Payer: BC Managed Care – PPO

## 2012-12-20 ENCOUNTER — Ambulatory Visit (INDEPENDENT_AMBULATORY_CARE_PROVIDER_SITE_OTHER): Payer: Medicare Other | Admitting: Cardiology

## 2012-12-20 VITALS — BP 148/90 | HR 66 | Ht 62.0 in | Wt 155.0 lb

## 2012-12-20 DIAGNOSIS — Z87891 Personal history of nicotine dependence: Secondary | ICD-10-CM | POA: Insufficient documentation

## 2012-12-20 DIAGNOSIS — I10 Essential (primary) hypertension: Secondary | ICD-10-CM

## 2012-12-20 DIAGNOSIS — I2510 Atherosclerotic heart disease of native coronary artery without angina pectoris: Secondary | ICD-10-CM

## 2012-12-20 DIAGNOSIS — E119 Type 2 diabetes mellitus without complications: Secondary | ICD-10-CM | POA: Insufficient documentation

## 2012-12-20 DIAGNOSIS — I771 Stricture of artery: Secondary | ICD-10-CM | POA: Insufficient documentation

## 2012-12-20 DIAGNOSIS — E785 Hyperlipidemia, unspecified: Secondary | ICD-10-CM | POA: Insufficient documentation

## 2012-12-20 DIAGNOSIS — I658 Occlusion and stenosis of other precerebral arteries: Secondary | ICD-10-CM | POA: Insufficient documentation

## 2012-12-20 DIAGNOSIS — I6529 Occlusion and stenosis of unspecified carotid artery: Secondary | ICD-10-CM | POA: Insufficient documentation

## 2012-12-20 DIAGNOSIS — I679 Cerebrovascular disease, unspecified: Secondary | ICD-10-CM

## 2012-12-20 DIAGNOSIS — Z8673 Personal history of transient ischemic attack (TIA), and cerebral infarction without residual deficits: Secondary | ICD-10-CM | POA: Insufficient documentation

## 2012-12-20 LAB — BASIC METABOLIC PANEL
Calcium: 9.4 mg/dL (ref 8.4–10.5)
GFR: 67.77 mL/min (ref >60.00)
Glucose, Bld: 90 mg/dL (ref 70–99)
Sodium: 139 mEq/L (ref 135–145)

## 2012-12-20 LAB — LIPID PANEL
Cholesterol: 185 mg/dL (ref 0–200)
HDL: 50.3 mg/dL (ref >39.00)
Triglycerides: 260 mg/dL — ABNORMAL HIGH (ref 0.0–149.0)

## 2012-12-20 LAB — HEPATIC FUNCTION PANEL
ALT: 21 U/L (ref 0–35)
AST: 20 U/L (ref 0–37)
Albumin: 4.2 g/dL (ref 3.5–5.2)
Alkaline Phosphatase: 69 U/L (ref 39–117)
Bilirubin, Direct: 0 mg/dL (ref 0.0–0.3)
Total Protein: 7.3 g/dL (ref 6.0–8.3)

## 2012-12-20 NOTE — Assessment & Plan Note (Signed)
Continue low-dose statin.she has not tolerated higher doses in the past. Lipids and liver are followed in research study.

## 2012-12-20 NOTE — Patient Instructions (Signed)
Your physician wants you to follow-up in: ONE YEAR WITH DR Shelda Pal will receive a reminder letter in the mail two months in advance. If you don't receive a letter, please call our office to schedule the follow-up appointment.   Your physician recommends that you HAVE LAB WORK TODAY  [phone] [address]

## 2012-12-20 NOTE — Progress Notes (Signed)
HPI: FU cerebrovascular disease. 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 She does not have CAD and had a normal myovue 06/17/2006. 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 Last carotid Dopplers in March 2013 showed 60-79% bilateral stenosis with followup recommended in 6 months. She was last seen in March 2013. Since then, she denies dyspnea on exertion, orthopnea, PND, pedal edema, palpitations, syncope or chest pain.   Current Outpatient Prescriptions  Medication Sig Dispense Refill  . aspirin 325 MG EC tablet Take 325 mg by mouth daily.        Marland Kitchen buPROPion (WELLBUTRIN XL) 300 MG 24 hr tablet Take 1 tablet (300 mg total) by mouth daily.  90 tablet  3  . clopidogrel (PLAVIX) 75 MG tablet Take 1 tablet (75 mg total) by mouth daily.  90 tablet  3  . lisinopril (PRINIVIL,ZESTRIL) 10 MG tablet Take 1 tablet (10 mg total) by mouth daily.  90 tablet  3  . metoprolol succinate (TOPROL-XL) 50 MG 24 hr tablet Take 1 tablet (50 mg total) by mouth daily. Take with or immediately following a meal.  90 tablet  3  . omeprazole (PRILOSEC) 40 MG capsule Take 1 capsule (40 mg total) by mouth daily.  90 capsule  2  . rosuvastatin (CRESTOR) 5 MG tablet Take 1 tablet (5 mg total) by mouth daily.  90 tablet  3  . traZODone (DESYREL) 50 MG tablet Take 0.5-1 tablets (25-50 mg total) by mouth at bedtime as needed.  90 tablet  3  . co-enzyme Q-10 30 MG capsule Take 30 mg by mouth daily.       No current facility-administered medications for this visit.     Past Medical History  Diagnosis Date  . HTN (hypertension)   . HLD (hyperlipidemia)   . Cerebrovascular disease, unspecified   . GERD (gastroesophageal reflux disease)   . Osteoarthrosis, unspecified whether generalized or localized, unspecified site   . TIA (transient ischemic attack)   . IBS (irritable bowel  syndrome)   . Anxiety   . Hiatal hernia   . Chronic gastritis   . Carotid stenosis   . Gastric ulcer   . Internal hemorrhoids     Past Surgical History  Procedure Laterality Date  . Bilateral carotid endarderectomy    . Tonsillectomy    . Sinus surgery with instatrak    . Finger surgery      History   Social History  . Marital Status: Widowed    Spouse Name: N/A    Number of Children: N/A  . Years of Education: N/A   Occupational History  . Not on file.   Social History Main Topics  . Smoking status: Former Smoker    Quit date: 04/02/2009  . Smokeless tobacco: Never Used  . Alcohol Use: No  . Drug Use: No  . Sexual Activity: Not on file   Other Topics Concern  . Not on file   Social History Narrative   Recent widow.    Works for [employer].     ROS: arthralgias but no fevers or chills, productive cough, hemoptysis, dysphasia, odynophagia, melena, hematochezia, dysuria, hematuria, rash, seizure activity, orthopnea, PND, pedal edema, claudication. Remaining systems are negative.  Physical Exam: Well-developed well-nourished in no acute distress.  Skin is warm and dry.  HEENT is normal.  Neck is supple. Bilateral bruit  Chest is clear to auscultation with normal expansion.  Cardiovascular exam is regular rate and rhythm.  Abdominal exam nontender or distended. No masses palpated. Extremities show no edema. neuro grossly intact  ECG sinus rhythm at a rate of 66. No ST changes

## 2012-12-20 NOTE — Assessment & Plan Note (Signed)
Blood pressure mildly elevated but she follows this at home and it is typically controlled. Continue present medications. Check potassium and renal function.

## 2012-12-20 NOTE — Assessment & Plan Note (Signed)
Continue aspirin and statin. Schedule followup carotid Dopplers. 

## 2012-12-21 LAB — LDL CHOLESTEROL, DIRECT: Direct LDL: 112 mg/dL

## 2013-02-11 ENCOUNTER — Other Ambulatory Visit: Payer: Self-pay | Admitting: Cardiology

## 2013-02-15 ENCOUNTER — Telehealth: Payer: Self-pay | Admitting: Cardiology

## 2013-02-15 NOTE — Telephone Encounter (Signed)
Spoke with pt, questions regarding lab work answered. °

## 2013-02-15 NOTE — Telephone Encounter (Signed)
New message    Got lab results from [place] ---but pt is to call her back regarding labs

## 2013-05-04 ENCOUNTER — Encounter: Payer: Self-pay | Admitting: Cardiology

## 2013-06-24 ENCOUNTER — Other Ambulatory Visit: Payer: Self-pay | Admitting: Cardiology

## 2013-06-26 NOTE — Telephone Encounter (Signed)
Do you want to refill his trazodone 50 mg

## 2013-06-26 NOTE — Telephone Encounter (Signed)
Get from PCP

## 2013-06-26 NOTE — Telephone Encounter (Signed)
Will need to get from PCP

## 2013-07-05 ENCOUNTER — Other Ambulatory Visit: Payer: Self-pay | Admitting: Cardiology

## 2013-07-11 NOTE — Telephone Encounter (Signed)
Pt will need to get from pcp

## 2013-08-08 ENCOUNTER — Other Ambulatory Visit: Payer: Self-pay | Admitting: Cardiology

## 2013-08-17 ENCOUNTER — Encounter: Payer: Self-pay | Admitting: Cardiology

## 2013-09-11 ENCOUNTER — Other Ambulatory Visit: Payer: Self-pay | Admitting: Cardiology

## 2013-09-26 ENCOUNTER — Telehealth: Payer: Self-pay | Admitting: Internal Medicine

## 2013-09-26 NOTE — Telephone Encounter (Signed)
lmovm to call back and est with [facility]

## 2013-11-01 ENCOUNTER — Other Ambulatory Visit: Payer: Self-pay | Admitting: *Deleted

## 2013-11-01 MED ORDER — ROSUVASTATIN CALCIUM 5 MG PO TABS: 5.0000 mg | ORAL_TABLET | Freq: Every day | ORAL | Status: DC

## 2013-11-27 ENCOUNTER — Ambulatory Visit (INDEPENDENT_AMBULATORY_CARE_PROVIDER_SITE_OTHER): Payer: Medicare Other | Admitting: Cardiology

## 2013-11-27 ENCOUNTER — Encounter: Payer: Self-pay | Admitting: Cardiology

## 2013-11-27 ENCOUNTER — Encounter: Payer: Self-pay | Admitting: *Deleted

## 2013-11-27 VITALS — BP 110/66 | HR 58 | Ht 62.0 in | Wt 145.0 lb

## 2013-11-27 DIAGNOSIS — I2510 Atherosclerotic heart disease of native coronary artery without angina pectoris: Secondary | ICD-10-CM

## 2013-11-27 DIAGNOSIS — I679 Cerebrovascular disease, unspecified: Secondary | ICD-10-CM

## 2013-11-27 DIAGNOSIS — I10 Essential (primary) hypertension: Secondary | ICD-10-CM

## 2013-11-27 MED ORDER — OMEPRAZOLE 40 MG PO CPDR: 40.0000 mg | DELAYED_RELEASE_CAPSULE | Freq: Every day | ORAL | Status: DC

## 2013-11-27 MED ORDER — CLOPIDOGREL BISULFATE 75 MG PO TABS: 75.0000 mg | ORAL_TABLET | Freq: Every day | ORAL | Status: DC

## 2013-11-27 MED ORDER — LISINOPRIL 10 MG PO TABS: 10.0000 mg | ORAL_TABLET | Freq: Every day | ORAL | Status: DC

## 2013-11-27 MED ORDER — METOPROLOL SUCCINATE ER 50 MG PO TB24: 50.0000 mg | ORAL_TABLET | Freq: Every day | ORAL | Status: DC

## 2013-11-27 NOTE — Assessment & Plan Note (Signed)
Continue statin. 

## 2013-11-27 NOTE — Assessment & Plan Note (Signed)
Blood pressure controlled. Continue present medications. 

## 2013-11-27 NOTE — Patient Instructions (Signed)

## 2013-11-27 NOTE — Progress Notes (Signed)
HPI: FU cerebrovascular disease. 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 She does not have CAD and had a normal myovue 06/17/2006. 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 Last carotid Dopplers in Nov 2014 showed 60-79% bilateral stenosis with followup recommended in 6 months. Since last seen, the patient denies any dyspnea on exertion, orthopnea, PND, pedal edema, palpitations, syncope or chest pain.   Current Outpatient Prescriptions  Medication Sig Dispense Refill  . aspirin 325 MG EC tablet Take 325 mg by mouth daily.      Marland Kitchen buPROPion (WELLBUTRIN XL) 300 MG 24 hr tablet Take 1 tablet (300 mg total) by mouth daily. 90 tablet 3  . clopidogrel (PLAVIX) 75 MG tablet Take 1 tablet (75 mg total) by mouth daily. 90 tablet 3  . co-enzyme Q-10 30 MG capsule Take 30 mg by mouth daily.    Marland Kitchen lisinopril (PRINIVIL,ZESTRIL) 10 MG tablet Take 1 tablet (10 mg total) by mouth daily. 90 tablet 3  . metoprolol succinate (TOPROL-XL) 50 MG 24 hr tablet Take 1 tablet (50 mg total) by mouth daily. Take with or immediately following a meal. 90 tablet 3  . rosuvastatin (CRESTOR) 5 MG tablet Take 1 tablet (5 mg total) by mouth daily at 6 PM. 90 tablet 0  . traZODone (DESYREL) 50 MG tablet TAKE ONE-HALF TO ONE TABLET BY MOUTH AT BEDTIME AS NEEDED 90 tablet 0  . omeprazole (PRILOSEC) 40 MG capsule TAKE ONE CAPSULE BY MOUTH ONCE DAILY 90 capsule 0   No current facility-administered medications for this visit.     Past Medical History  Diagnosis Date  . HTN (hypertension)   . HLD (hyperlipidemia)   . Cerebrovascular disease, unspecified   . GERD (gastroesophageal reflux disease)   . Osteoarthrosis, unspecified whether generalized or localized, unspecified site   . TIA (transient ischemic attack)   . IBS (irritable bowel syndrome)   . Anxiety   . Hiatal hernia     . Chronic gastritis   . Carotid stenosis   . Gastric ulcer   . Internal hemorrhoids     Past Surgical History  Procedure Laterality Date  . Bilateral carotid endarderectomy    . Tonsillectomy    . Sinus surgery with instatrak    . Finger surgery      History   Social History  . Marital Status: Widowed    Spouse Name: N/A    Number of Children: N/A  . Years of Education: N/A   Occupational History  . Not on file.   Social History Main Topics  . Smoking status: Former Smoker    Quit date: 04/02/2009  . Smokeless tobacco: Never Used  . Alcohol Use: No  . Drug Use: No  . Sexual Activity: Not on file   Other Topics Concern  . Not on file   Social History Narrative   Recent widow.    Works for [employer].     ROS: no fevers or chills, productive cough, hemoptysis, dysphasia, odynophagia, melena, hematochezia, dysuria, hematuria, rash, seizure activity, orthopnea, PND, pedal edema, claudication. Remaining systems are negative.  Physical Exam: Well-developed well-nourished in no acute distress.  Skin is warm and dry.  HEENT is normal.  Neck is supple.  Chest is clear to auscultation with normal expansion.  Cardiovascular exam is regular rate and rhythm.  Abdominal exam nontender or distended. No masses  palpated. Extremities show no edema. neuro grossly intact  ECG Sinus rhythm at a rate of 68. No ST changes.

## 2013-11-27 NOTE — Assessment & Plan Note (Signed)
Continue aspirin and statin. Schedule followup carotid Dopplers. 

## 2013-12-07 LAB — HEPATIC FUNCTION PANEL
ALT: 17 U/L (ref 7–35)
AST: 14 U/L (ref 13–35)
Alkaline Phosphatase: 83 U/L (ref 25–125)
BILIRUBIN DIRECT: 0.1 mg/dL
BILIRUBIN, TOTAL: 0.3 mg/dL

## 2013-12-07 LAB — HEMOGLOBIN A1C: HEMOGLOBIN A1C: 6.2 % — AB (ref 4.0–6.0)

## 2013-12-07 LAB — BASIC METABOLIC PANEL: Glucose: 93 mg/dL

## 2013-12-12 ENCOUNTER — Ambulatory Visit (HOSPITAL_COMMUNITY)
Admission: RE | Admit: 2013-12-12 | Discharge: 2013-12-12 | Disposition: A | Discharge status: Home / Self Care | Payer: Medicare Other | Source: Ambulatory Visit | Attending: Cardiovascular Disease | Admitting: Cardiovascular Disease

## 2013-12-12 ENCOUNTER — Other Ambulatory Visit: Payer: Self-pay | Admitting: Family Medicine

## 2013-12-12 DIAGNOSIS — I2510 Atherosclerotic heart disease of native coronary artery without angina pectoris: Secondary | ICD-10-CM | POA: Diagnosis not present

## 2013-12-12 DIAGNOSIS — I10 Essential (primary) hypertension: Secondary | ICD-10-CM | POA: Diagnosis not present

## 2013-12-12 DIAGNOSIS — I672 Cerebral atherosclerosis: Secondary | ICD-10-CM | POA: Diagnosis present

## 2013-12-12 DIAGNOSIS — I679 Cerebrovascular disease, unspecified: Secondary | ICD-10-CM

## 2013-12-12 DIAGNOSIS — Z1231 Encounter for screening mammogram for malignant neoplasm of breast: Secondary | ICD-10-CM

## 2013-12-12 NOTE — Progress Notes (Signed)
Carotid Duplex Completed. Nela Bascom, BS, RDMS, RVT  

## 2013-12-20 ENCOUNTER — Other Ambulatory Visit: Payer: Self-pay | Admitting: *Deleted

## 2013-12-20 MED ORDER — BUPROPION HCL ER (XL) 300 MG PO TB24: 300.0000 mg | ORAL_TABLET | Freq: Every day | ORAL | Status: DC

## 2013-12-28 ENCOUNTER — Encounter: Payer: Self-pay | Admitting: Cardiology

## 2013-12-28 NOTE — Telephone Encounter (Signed)
New message      Talk to a nurse regarding her carotid doppler test

## 2013-12-28 NOTE — Telephone Encounter (Signed)
Left message for pt to call.

## 2013-12-29 ENCOUNTER — Ambulatory Visit
Admission: RE | Admit: 2013-12-29 | Discharge: 2013-12-29 | Disposition: A | Discharge status: Home / Self Care | Payer: Medicare Other | Source: Ambulatory Visit | Attending: Family Medicine | Admitting: Family Medicine

## 2013-12-29 ENCOUNTER — Encounter (INDEPENDENT_AMBULATORY_CARE_PROVIDER_SITE_OTHER): Payer: Self-pay

## 2013-12-29 DIAGNOSIS — Z1231 Encounter for screening mammogram for malignant neoplasm of breast: Secondary | ICD-10-CM

## 2014-01-08 ENCOUNTER — Other Ambulatory Visit: Payer: Self-pay | Admitting: Family Medicine

## 2014-01-08 DIAGNOSIS — R928 Other abnormal and inconclusive findings on diagnostic imaging of breast: Secondary | ICD-10-CM

## 2014-01-15 ENCOUNTER — Encounter: Payer: Self-pay | Admitting: Cardiology

## 2014-01-17 NOTE — Telephone Encounter (Signed)
This encounter was created in error - please disregard.

## 2014-01-18 ENCOUNTER — Encounter: Payer: Self-pay | Admitting: Family Medicine

## 2014-01-22 ENCOUNTER — Telehealth: Payer: Self-pay | Admitting: Family Medicine

## 2014-01-22 ENCOUNTER — Telehealth: Payer: Self-pay

## 2014-01-22 DIAGNOSIS — E785 Hyperlipidemia, unspecified: Secondary | ICD-10-CM

## 2014-01-22 NOTE — Telephone Encounter (Signed)
I called patient to schedule a 3 month f/u and labs but patient stated she is going to start seeing another PCP closer to home.

## 2014-01-22 NOTE — Telephone Encounter (Signed)
Labs entered.

## 2014-01-25 ENCOUNTER — Ambulatory Visit: Payer: Medicare Other | Admitting: Family Medicine

## 2014-03-15 ENCOUNTER — Ambulatory Visit
Admission: RE | Admit: 2014-03-15 | Discharge: 2014-03-15 | Disposition: A | Discharge status: Home / Self Care | Payer: Medicare Other | Source: Ambulatory Visit | Attending: Family Medicine | Admitting: Family Medicine

## 2014-03-15 DIAGNOSIS — R928 Other abnormal and inconclusive findings on diagnostic imaging of breast: Secondary | ICD-10-CM

## 2014-05-11 ENCOUNTER — Other Ambulatory Visit: Payer: Self-pay

## 2014-05-11 MED ORDER — ROSUVASTATIN CALCIUM 5 MG PO TABS: 5.0000 mg | ORAL_TABLET | Freq: Every day | ORAL | Status: DC

## 2014-05-17 ENCOUNTER — Other Ambulatory Visit: Payer: Self-pay

## 2014-07-02 ENCOUNTER — Other Ambulatory Visit: Payer: Self-pay | Admitting: *Deleted

## 2014-07-09 ENCOUNTER — Telehealth: Payer: Self-pay

## 2014-07-09 ENCOUNTER — Other Ambulatory Visit: Payer: Self-pay

## 2014-07-09 MED ORDER — BUPROPION HCL ER (XL) 300 MG PO TB24: 300.0000 mg | ORAL_TABLET | Freq: Every day | ORAL | Status: DC

## 2014-07-09 NOTE — Telephone Encounter (Signed)
Yes, we can refill x 1, then will need to get from PCP.

## 2014-12-23 ENCOUNTER — Other Ambulatory Visit: Payer: Self-pay | Admitting: Cardiology

## 2014-12-24 NOTE — Telephone Encounter (Signed)
Rx(s) sent to pharmacy electronically.  

## 2015-01-05 ENCOUNTER — Other Ambulatory Visit: Payer: Self-pay | Admitting: Cardiology

## 2015-01-07 NOTE — Telephone Encounter (Signed)
REFILL 

## 2015-05-06 ENCOUNTER — Telehealth: Payer: Self-pay | Admitting: Cardiology

## 2015-05-06 DIAGNOSIS — I63039 Cerebral infarction due to thrombosis of unspecified carotid artery: Secondary | ICD-10-CM

## 2015-05-06 DIAGNOSIS — R0989 Other specified symptoms and signs involving the circulatory and respiratory systems: Secondary | ICD-10-CM

## 2015-05-06 DIAGNOSIS — Z9889 Other specified postprocedural states: Secondary | ICD-10-CM

## 2015-05-06 NOTE — Telephone Encounter (Signed)
Pt overdue for annual carotid doppler.  Test ordered. msg sent to scheduling to arrange.

## 2015-05-06 NOTE — Telephone Encounter (Signed)
New message   Patient has an order for cartoid date for 11/2014 . Need another order submitted due to expired dated.

## 2015-06-10 ENCOUNTER — Encounter: Payer: Self-pay | Admitting: *Deleted

## 2015-06-10 NOTE — Progress Notes (Signed)
HPI: FU cerebrovascular disease. 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 She does not have CAD and had a normal myoview 06/17/2006. 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 Last carotid Dopplers in Nov 2015 showed 50-69% bilateral stenosis with followup recommended in 12 months. Since last seen,   Current Outpatient Prescriptions  Medication Sig Dispense Refill  . aspirin 325 MG EC tablet Take 325 mg by mouth daily.      Marland Kitchen buPROPion (WELLBUTRIN XL) 300 MG 24 hr tablet Take 1 tablet (300 mg total) by mouth daily. PATIENT NEEDS TO CONTACT OFFICE FOR ADDITIONAL REFILLS 30 tablet 0  . clopidogrel (PLAVIX) 75 MG tablet TAKE ONE TABLET BY MOUTH  DAILY 90 tablet 0  . co-enzyme Q-10 30 MG capsule Take 30 mg by mouth daily.    . CRESTOR 5 MG tablet TAKE ONE TABLET BY MOUTH ONCE DAILY AT  6  PM 90 tablet 0  . lisinopril (PRINIVIL,ZESTRIL) 10 MG tablet Take 1 tablet (10 mg total) by mouth daily. 90 tablet 3  . metoprolol succinate (TOPROL-XL) 50 MG 24 hr tablet TAKE ONE TABLET BY MOUTH  DAILY. TAKE WITH OR IMMEDIATELY FOLLOWING A MEAL 90 tablet 0  . omeprazole (PRILOSEC) 40 MG capsule TAKE ONE CAPSULE BY MOUTH ONCE DAILY 90 capsule 0  . traZODone (DESYREL) 50 MG tablet TAKE ONE-HALF TO ONE TABLET BY MOUTH AT BEDTIME AS NEEDED 90 tablet 0   No current facility-administered medications for this visit.     Past Medical History  Diagnosis Date  . HTN (hypertension)   . HLD (hyperlipidemia)   . Cerebrovascular disease, unspecified   . GERD (gastroesophageal reflux disease)   . Osteoarthrosis, unspecified whether generalized or localized, unspecified site   . TIA (transient ischemic attack)   . IBS (irritable bowel syndrome)   . Anxiety   . Hiatal hernia   . Chronic gastritis   . Carotid stenosis   . Gastric ulcer   . Internal hemorrhoids      Past Surgical History  Procedure Laterality Date  . Bilateral carotid endarderectomy    . Tonsillectomy    . Sinus surgery with instatrak    . Finger surgery      Social History   Social History  . Marital Status: Widowed    Spouse Name: N/A  . Number of Children: N/A  . Years of Education: N/A   Occupational History  . Not on file.   Social History Main Topics  . Smoking status: Former Smoker    Quit date: 04/02/2009  . Smokeless tobacco: Never Used  . Alcohol Use: No  . Drug Use: No  . Sexual Activity: Not on file   Other Topics Concern  . Not on file   Social History Narrative   Recent widow.    Works for [employer].     Family History  Problem Relation Age of Onset  . Heart disease Brother   . Stroke Father   . Diabetes    . Colon cancer Neg Hx   . Diabetes Mother     ROS: no fevers or chills, productive cough, hemoptysis, dysphasia, odynophagia, melena, hematochezia, dysuria, hematuria, rash, seizure activity, orthopnea, PND, pedal edema, claudication. Remaining systems are negative.  Physical Exam: Well-developed well-nourished in no acute distress.  Skin is warm and dry.  HEENT is normal.  Neck is supple.  Chest is clear to auscultation with normal expansion.  Cardiovascular exam is regular rate and rhythm.  Abdominal exam nontender or distended. No masses palpated. Extremities show no edema. neuro grossly intact  ECG     This encounter was created in error - please disregard.

## 2015-06-14 ENCOUNTER — Encounter: Payer: Medicare Other | Admitting: Cardiology

## 2015-08-02 NOTE — Progress Notes (Signed)
HPI: FU cerebrovascular disease. 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 She does not have CAD and had a normal nuclear study 06/17/2006. 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 Last carotid Dopplers in Nov 2015 showed 50-69% bilateral stenosis with followup recommended in 12 months. Since last seen,  She denies dyspnea, chest pain, palpitations or syncope. She has had occasional dizziness with turning her head.   Current Outpatient Prescriptions  Medication Sig Dispense Refill  . aspirin 325 MG EC tablet Take 325 mg by mouth daily.      Marland Kitchen buPROPion (WELLBUTRIN XL) 300 MG 24 hr tablet Take 1 tablet (300 mg total) by mouth daily. PATIENT NEEDS TO CONTACT OFFICE FOR ADDITIONAL REFILLS 30 tablet 0  . clopidogrel (PLAVIX) 75 MG tablet TAKE ONE TABLET BY MOUTH  DAILY 90 tablet 0  . CRESTOR 5 MG tablet TAKE ONE TABLET BY MOUTH ONCE DAILY AT  6  PM 90 tablet 0  . lisinopril (PRINIVIL,ZESTRIL) 10 MG tablet Take 1 tablet (10 mg total) by mouth daily. 90 tablet 3  . metoprolol succinate (TOPROL-XL) 50 MG 24 hr tablet TAKE ONE TABLET BY MOUTH  DAILY. TAKE WITH OR IMMEDIATELY FOLLOWING A MEAL 90 tablet 0  . omeprazole (PRILOSEC) 40 MG capsule TAKE ONE CAPSULE BY MOUTH ONCE DAILY 90 capsule 0  . traZODone (DESYREL) 50 MG tablet TAKE ONE-HALF TO ONE TABLET BY MOUTH AT BEDTIME AS NEEDED 90 tablet 0   No current facility-administered medications for this visit.     Past Medical History  Diagnosis Date  . HTN (hypertension)   . HLD (hyperlipidemia)   . Cerebrovascular disease, unspecified   . GERD (gastroesophageal reflux disease)   . Osteoarthrosis, unspecified whether generalized or localized, unspecified site   . TIA (transient ischemic attack)   . IBS (irritable bowel syndrome)   . Anxiety   . Hiatal hernia   . Chronic gastritis   . Carotid  stenosis   . Gastric ulcer   . Internal hemorrhoids     Past Surgical History  Procedure Laterality Date  . Bilateral carotid endarderectomy    . Tonsillectomy    . Sinus surgery with instatrak    . Finger surgery      Social History   Social History  . Marital Status: Widowed    Spouse Name: N/A  . Number of Children: N/A  . Years of Education: N/A   Occupational History  . Not on file.   Social History Main Topics  . Smoking status: Former Smoker    Quit date: 04/02/2009  . Smokeless tobacco: Never Used  . Alcohol Use: No  . Drug Use: No  . Sexual Activity: Not on file   Other Topics Concern  . Not on file   Social History Narrative   Recent widow.    Works for [employer].     Family History  Problem Relation Age of Onset  . Heart disease Brother   . Stroke Father   . Diabetes    . Colon cancer Neg Hx   . Diabetes Mother   . Asthma Mother     ROS: no fevers or chills, productive cough, hemoptysis, dysphasia, odynophagia, melena, hematochezia, dysuria, hematuria, rash, seizure activity, orthopnea, PND, pedal edema, claudication. Remaining systems are negative.  Physical Exam: Well-developed well-nourished in no acute distress.  Skin is warm and  dry.  HEENT is normal.  Neck is supple. Bilateral bruits Chest is clear to auscultation with normal expansion.  Cardiovascular exam is regular rate and rhythm.  Abdominal exam nontender or distended. No masses palpated. Positive bruit Extremities show no edema. neuro grossly intact  ECG Sinus rhythm at a rate of 70. Normal axis. First-degree AV block. Cannot rule out prior septal infarct.  Assessment and plan  1 cerebrovascular disease-continue aspirin and statin. Schedule follow-up carotid Dopplers. 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 3 hypertension-blood pressure controlled. Continue present medications. 4 hyperlipidemia-continue statin. She did not tolerate  high-dose statin previously. 5 bruit-schedule abdominal ultrasound to further assess.  Kirk Ruths, MD

## 2015-08-06 ENCOUNTER — Encounter: Payer: Self-pay | Admitting: Cardiology

## 2015-08-06 ENCOUNTER — Ambulatory Visit (INDEPENDENT_AMBULATORY_CARE_PROVIDER_SITE_OTHER): Payer: Medicare Other | Admitting: Cardiology

## 2015-08-06 VITALS — BP 140/72 | HR 70 | Ht 62.0 in | Wt 146.0 lb

## 2015-08-06 DIAGNOSIS — D151 Benign neoplasm of heart: Secondary | ICD-10-CM

## 2015-08-06 DIAGNOSIS — E785 Hyperlipidemia, unspecified: Secondary | ICD-10-CM

## 2015-08-06 DIAGNOSIS — I10 Essential (primary) hypertension: Secondary | ICD-10-CM | POA: Diagnosis not present

## 2015-08-06 DIAGNOSIS — R0989 Other specified symptoms and signs involving the circulatory and respiratory systems: Secondary | ICD-10-CM

## 2015-08-06 DIAGNOSIS — I679 Cerebrovascular disease, unspecified: Secondary | ICD-10-CM | POA: Diagnosis not present

## 2015-08-06 NOTE — Patient Instructions (Signed)
Medications  No Change   Procedures  Your physician has requested that you have an echocardiogram. Echocardiography is a painless test that uses sound waves to create images of your heart. It provides your doctor with information about the size and shape of your heart and how well your heart's chambers and valves are working. This procedure takes approximately one hour. There are no restrictions for this procedure.  Your physician has requested that you have an abdominal aorta duplex. During this test, an ultrasound is used to evaluate the aorta. Allow 30 minutes for this exam. Do not eat after midnight the day before and avoid carbonated beverages  Your physician has requested that you have a carotid duplex. This test is an ultrasound of the carotid arteries in your neck. It looks at blood flow through these arteries that supply the brain with blood. Allow one hour for this exam. There are no restrictions or special instructions.    Follow-up in 6 months with Dr. Stanford Breed.

## 2015-08-23 ENCOUNTER — Other Ambulatory Visit (HOSPITAL_COMMUNITY): Payer: Self-pay

## 2015-08-23 ENCOUNTER — Ambulatory Visit (HOSPITAL_COMMUNITY): Payer: Medicare Other | Attending: Internal Medicine

## 2015-08-23 DIAGNOSIS — D151 Benign neoplasm of heart: Secondary | ICD-10-CM | POA: Diagnosis not present

## 2015-08-23 DIAGNOSIS — E785 Hyperlipidemia, unspecified: Secondary | ICD-10-CM | POA: Diagnosis not present

## 2015-08-23 DIAGNOSIS — I351 Nonrheumatic aortic (valve) insufficiency: Secondary | ICD-10-CM | POA: Diagnosis not present

## 2015-08-23 DIAGNOSIS — I10 Essential (primary) hypertension: Secondary | ICD-10-CM | POA: Diagnosis not present

## 2015-08-23 DIAGNOSIS — I340 Nonrheumatic mitral (valve) insufficiency: Secondary | ICD-10-CM | POA: Diagnosis not present

## 2015-08-23 HISTORY — PX: TRANSTHORACIC ECHOCARDIOGRAM: SHX275

## 2015-08-30 ENCOUNTER — Encounter (INDEPENDENT_AMBULATORY_CARE_PROVIDER_SITE_OTHER): Payer: Self-pay

## 2015-08-30 ENCOUNTER — Ambulatory Visit (HOSPITAL_COMMUNITY)
Admission: RE | Admit: 2015-08-30 | Discharge: 2015-08-30 | Disposition: A | Discharge status: Home / Self Care | Payer: Medicare Other | Source: Ambulatory Visit | Attending: Cardiology | Admitting: Cardiology

## 2015-08-30 DIAGNOSIS — E785 Hyperlipidemia, unspecified: Secondary | ICD-10-CM | POA: Diagnosis not present

## 2015-08-30 DIAGNOSIS — I708 Atherosclerosis of other arteries: Secondary | ICD-10-CM | POA: Diagnosis not present

## 2015-08-30 DIAGNOSIS — I679 Cerebrovascular disease, unspecified: Secondary | ICD-10-CM | POA: Diagnosis present

## 2015-08-30 DIAGNOSIS — R0989 Other specified symptoms and signs involving the circulatory and respiratory systems: Secondary | ICD-10-CM

## 2015-08-30 DIAGNOSIS — F419 Anxiety disorder, unspecified: Secondary | ICD-10-CM | POA: Insufficient documentation

## 2015-08-30 DIAGNOSIS — I6523 Occlusion and stenosis of bilateral carotid arteries: Secondary | ICD-10-CM | POA: Insufficient documentation

## 2015-08-30 DIAGNOSIS — I700 Atherosclerosis of aorta: Secondary | ICD-10-CM | POA: Insufficient documentation

## 2015-08-30 DIAGNOSIS — I10 Essential (primary) hypertension: Secondary | ICD-10-CM | POA: Insufficient documentation

## 2015-08-30 DIAGNOSIS — K219 Gastro-esophageal reflux disease without esophagitis: Secondary | ICD-10-CM | POA: Diagnosis not present

## 2015-09-02 ENCOUNTER — Telehealth: Payer: Self-pay

## 2015-09-02 DIAGNOSIS — R0989 Other specified symptoms and signs involving the circulatory and respiratory systems: Secondary | ICD-10-CM

## 2015-09-02 NOTE — Telephone Encounter (Signed)
-----   Message from Lelon Perla, MD sent at 08/31/2015  6:45 AM EDT ----- Fu with me as scheduled 6 months Kirk Ruths

## 2016-03-12 ENCOUNTER — Other Ambulatory Visit: Payer: Self-pay | Admitting: Family Medicine

## 2016-03-12 DIAGNOSIS — N6489 Other specified disorders of breast: Secondary | ICD-10-CM

## 2016-03-12 LAB — BASIC METABOLIC PANEL
BUN: 13 (ref 4–21)
Creatinine: 0.9 (ref 0.5–1.1)
Glucose: 93
Potassium: 4.3 (ref 3.4–5.3)
SODIUM: 142 (ref 137–147)

## 2016-03-12 LAB — HEPATIC FUNCTION PANEL
ALT: 11 (ref 7–35)
AST: 14 (ref 13–35)
Alkaline Phosphatase: 76 (ref 25–125)
Bilirubin, Total: 0.4

## 2016-03-12 LAB — LIPID PANEL
Cholesterol: 197 (ref 0–200)
Triglycerides: 309 — AB (ref 40–160)

## 2016-03-18 ENCOUNTER — Encounter: Payer: Self-pay | Admitting: *Deleted

## 2016-03-18 ENCOUNTER — Encounter: Payer: Self-pay | Admitting: Cardiology

## 2016-03-18 ENCOUNTER — Encounter: Payer: Self-pay | Admitting: Family Medicine

## 2016-03-18 DIAGNOSIS — Z006 Encounter for examination for normal comparison and control in clinical research program: Secondary | ICD-10-CM

## 2016-03-18 NOTE — Progress Notes (Signed)
Orion-10 Study  All elements of the informed consent form,study requirements and expectations were reviewed with the patient, and all questions and concerns were identified and addressed prior to the signing of the consent. No procedures were performed prior to consenting the patient. The patient was given an adequate amount of time to make an informed decision. A copy of the informed consent was provided to the patient to take home. 03/18/16 09:24am

## 2016-03-20 ENCOUNTER — Ambulatory Visit
Admission: RE | Admit: 2016-03-20 | Discharge: 2016-03-20 | Disposition: A | Discharge status: Home / Self Care | Payer: Medicare Other | Source: Ambulatory Visit | Attending: Family Medicine | Admitting: Family Medicine

## 2016-03-20 DIAGNOSIS — N6489 Other specified disorders of breast: Secondary | ICD-10-CM

## 2016-03-26 ENCOUNTER — Other Ambulatory Visit: Payer: Self-pay | Admitting: *Deleted

## 2016-03-26 ENCOUNTER — Encounter: Payer: Self-pay | Admitting: *Deleted

## 2016-03-26 ENCOUNTER — Encounter: Payer: Self-pay | Admitting: Cardiology

## 2016-03-26 DIAGNOSIS — Z006 Encounter for examination for normal comparison and control in clinical research program: Secondary | ICD-10-CM

## 2016-03-26 MED ORDER — AMBULATORY NON FORMULARY MEDICATION: 300.0000 mg | Status: DC

## 2016-03-26 NOTE — Progress Notes (Signed)
Orion-10 Study Patient was randomized into the Orion-10 study. Patient received Inclisiran sodium 300 mg vs placebo. Patient tolerated injection well. I will see patient in 30 days for next Orion-10 visit.

## 2016-04-17 ENCOUNTER — Telehealth: Payer: Self-pay | Admitting: Cardiology

## 2016-04-24 ENCOUNTER — Encounter: Payer: Self-pay | Admitting: *Deleted

## 2016-04-24 DIAGNOSIS — Z006 Encounter for examination for normal comparison and control in clinical research program: Secondary | ICD-10-CM

## 2016-04-24 NOTE — Progress Notes (Signed)
Orion-10 Study  I saw patient for 30-day study visit. Patient is doing well. I will see patient back in 60 days for next study visit.

## 2016-05-18 NOTE — Telephone Encounter (Signed)
Close encounter 

## 2016-07-03 ENCOUNTER — Encounter: Payer: Self-pay | Admitting: *Deleted

## 2016-07-03 DIAGNOSIS — Z006 Encounter for examination for normal comparison and control in clinical research program: Secondary | ICD-10-CM

## 2016-07-03 NOTE — Progress Notes (Signed)
I saw patient for 90-day Orion study visit. Patient received second injection subcutaneously in left abdomen at 10:03. Patient was observed 30 minutes post injection and did well. I will see patient back in 60 days for next study visit.

## 2016-08-28 ENCOUNTER — Encounter: Payer: Self-pay | Admitting: *Deleted

## 2016-08-28 DIAGNOSIS — Z006 Encounter for examination for normal comparison and control in clinical research program: Secondary | ICD-10-CM

## 2016-08-28 NOTE — Progress Notes (Signed)
I saw patient for [name] [name] 150 day study visit. Patient is doing well and had no complaints. I will see patient back for next study visit on Nov. 15, 2018 at 8:00am.

## 2016-11-24 ENCOUNTER — Ambulatory Visit (INDEPENDENT_AMBULATORY_CARE_PROVIDER_SITE_OTHER): Payer: Medicare Other

## 2016-11-24 ENCOUNTER — Ambulatory Visit (HOSPITAL_COMMUNITY)
Admission: EM | Admit: 2016-11-24 | Discharge: 2016-11-24 | Disposition: A | Discharge status: Home / Self Care | Payer: Medicare Other | Attending: Family Medicine | Admitting: Family Medicine

## 2016-11-24 ENCOUNTER — Encounter (HOSPITAL_COMMUNITY): Payer: Self-pay | Admitting: Family Medicine

## 2016-11-24 DIAGNOSIS — R0789 Other chest pain: Secondary | ICD-10-CM | POA: Diagnosis not present

## 2016-11-24 DIAGNOSIS — N6489 Other specified disorders of breast: Secondary | ICD-10-CM | POA: Diagnosis not present

## 2016-11-24 DIAGNOSIS — S20219A Contusion of unspecified front wall of thorax, initial encounter: Secondary | ICD-10-CM

## 2016-11-24 NOTE — Discharge Instructions (Signed)
The chest x-ray shows no internal injury.  The bruising will take 2-3 weeks to resolve.  Usually warm compresses will speed recovery from the bruising at this point.
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
## 2016-12-10 ENCOUNTER — Encounter: Payer: Self-pay | Admitting: *Deleted

## 2016-12-10 DIAGNOSIS — Z006 Encounter for examination for normal comparison and control in clinical research program: Secondary | ICD-10-CM

## 2016-12-10 NOTE — Progress Notes (Signed)
Patient to Research clinic for [id number] in the [facility] 10 research study.  No c/o, aes recorded and added to eCRF, no saes.  Subject given injection and remained in clinic for protocol required time.  Follow up appointment scheduled.

## 2017-02-18 ENCOUNTER — Encounter: Payer: Self-pay | Admitting: *Deleted

## 2017-02-18 DIAGNOSIS — Z006 Encounter for examination for normal comparison and control in clinical research program: Secondary | ICD-10-CM

## 2017-02-18 NOTE — Progress Notes (Unsigned)
Subject to research clinic for [id number] in the [place] 10 study.  No c/o, aes or saes to report.  Next visit with injection scheduled for Jun 18, 2017 @0900 .

## 2017-03-08 ENCOUNTER — Ambulatory Visit (HOSPITAL_COMMUNITY)
Admission: RE | Admit: 2017-03-08 | Discharge: 2017-03-08 | Disposition: A | Discharge status: Home / Self Care | Payer: Medicare Other | Source: Ambulatory Visit | Attending: Internal Medicine | Admitting: Internal Medicine

## 2017-03-08 DIAGNOSIS — Z9889 Other specified postprocedural states: Secondary | ICD-10-CM | POA: Diagnosis not present

## 2017-03-08 DIAGNOSIS — I63039 Cerebral infarction due to thrombosis of unspecified carotid artery: Secondary | ICD-10-CM | POA: Diagnosis not present

## 2017-03-08 DIAGNOSIS — I6523 Occlusion and stenosis of bilateral carotid arteries: Secondary | ICD-10-CM | POA: Insufficient documentation

## 2017-03-11 ENCOUNTER — Other Ambulatory Visit: Payer: Self-pay | Admitting: *Deleted

## 2017-03-11 DIAGNOSIS — I679 Cerebrovascular disease, unspecified: Secondary | ICD-10-CM

## 2017-04-08 ENCOUNTER — Ambulatory Visit: Payer: Self-pay | Admitting: General Surgery

## 2017-04-08 NOTE — H&P (View-Only) (Signed)
History of Present Illness [name] Ruff MD; 0/25/4270 2:06 PM) The patient is a 72 year old female who presents with anal pain. 72 year old female who presents to the office with complaints of chronic anal pain and bleeding. This is been going on for several years. She reports regular bowel habits and denies any history of constipation or straining. She reports bright red blood on the toilet paper and no blood in her stool. Her last colonoscopy was in 2013 and normal. She does take an aspirin daily. She reports a hemorrhoid that has to be reduced after bowel movements.   Past Surgical History Mammie Lorenzo, LPN; 07/19/7626 3:15 PM) Carotid Artery Surgery Bilateral. Foot Surgery Bilateral. Oral Surgery Tonsillectomy  Diagnostic Studies History Mammie Lorenzo, LPN; 1/76/1607 3:71 PM) Colonoscopy 1-5 years ago Mammogram 1-3 years ago Pap Smear 1-5 years ago  Allergies Mammie Lorenzo, LPN; 0/62/6948 5:46 PM) Sulfa Antibiotics Swelling. Allergies Reconciled  Medication History Mammie Lorenzo, LPN; 2/70/3500 9:38 PM) BuPROPion HCl ER (XL) (300MG  Tablet ER 24HR, Oral) Active. TraZODone HCl (50MG  Tablet, Oral) Active. Omeprazole (40MG  Capsule DR, Oral) Active. Lisinopril (10MG  Tablet, Oral) Active. Aspirin (325MG  Tablet, Oral) Active. Plavix (75MG  Tablet, Oral) Active. Rosuvastatin Calcium (20MG  Tablet, Oral) Active. [facility] Vitamin B12 (1000MCG Tablet, Oral) Active. Metoprolol Succinate (25MG  CP24 Sprinkle, Oral) Active. Medications Reconciled  Social History Mammie Lorenzo, LPN; 1/82/9937 1:69 PM) Caffeine use Carbonated beverages, Coffee. No alcohol use No drug use Tobacco use Former smoker.  Family History Mammie Lorenzo, LPN; 6/78/9381 0:17 PM) Arthritis Brother, Mother, Sister. Depression Sister. Hypertension Brother. Respiratory Condition Brother, Mother, Sister.  Pregnancy / Birth History Mammie Lorenzo, LPN; 06/05/2583 2:77 PM) Age at  menarche 81 years. Age of menopause <45 Gravida 61 Maternal age 57-35 Para 0 Regular periods  Other Problems Mammie Lorenzo, LPN; 09/19/2351 6:14 PM) Arthritis Gastroesophageal Reflux Disease Hemorrhoids High blood pressure Hypercholesterolemia     Review of Systems Claiborne Billings Dockery LPN; 4/31/5400 8:67 PM) General Not Present- Appetite Loss, Chills, Fatigue, Fever, Night Sweats, Weight Gain and Weight Loss. Skin Not Present- Change in Wart/Mole, Dryness, Hives, Jaundice, New Lesions, Non-Healing Wounds, Rash and Ulcer. HEENT Present- Wears glasses/contact lenses. Not Present- Earache, Hearing Loss, Hoarseness, Nose Bleed, Oral Ulcers, Ringing in the Ears, Seasonal Allergies, Sinus Pain, Sore Throat, Visual Disturbances and Yellow Eyes. Respiratory Not Present- Bloody sputum, Chronic Cough, Difficulty Breathing, Snoring and Wheezing. Breast Present- Breast Pain. Not Present- Breast Mass, Nipple Discharge and Skin Changes. Cardiovascular Not Present- Chest Pain, Difficulty Breathing Lying Down, Leg Cramps, Palpitations, Rapid Heart Rate, Shortness of Breath and Swelling of Extremities. Gastrointestinal Present- Hemorrhoids. Not Present- Abdominal Pain, Bloating, Bloody Stool, Change in Bowel Habits, Chronic diarrhea, Constipation, Difficulty Swallowing, Excessive gas, Gets full quickly at meals, Indigestion, Nausea, Rectal Pain and Vomiting. Female Genitourinary Not Present- Frequency, Nocturia, Painful Urination, Pelvic Pain and Urgency. Musculoskeletal Present- Joint Pain. Not Present- Back Pain, Joint Stiffness, Muscle Pain, Muscle Weakness and Swelling of Extremities. Neurological Not Present- Decreased Memory, Fainting, Headaches, Numbness, Seizures, Tingling, Tremor, Trouble walking and Weakness. Psychiatric Not Present- Anxiety, Bipolar, Change in Sleep Pattern, Depression, Fearful and Frequent crying. Endocrine Not Present- Cold Intolerance, Excessive Hunger, Hair Changes,  Heat Intolerance, Hot flashes and New Diabetes. Hematology Present- Blood Thinners and Easy Bruising. Not Present- Excessive bleeding, Gland problems, HIV and Persistent Infections.  Vitals Claiborne Billings Dockery LPN; 07/15/5091 2:67 PM) 04/08/2017 1:55 PM Weight: 146.4 lb Height: 63in Body Surface Area: 1.69 m Body Mass Index: 25.93 kg/m  Temp.: 97.43F(Temporal)  Pulse: 71 (Regular)  BP:  122/64 (Sitting, Left Arm, Standard)      Physical Exam [name] Ruff MD; 06/05/2583 2:17 PM)  General Mental Status-Alert. General Appearance-Not in acute distress. Build & Nutrition-Well nourished. Posture-Normal posture. Gait-Normal.  Head and Neck Head-normocephalic, atraumatic with no lesions or palpable masses. Trachea-midline.  Chest and Lung Exam Chest and lung exam reveals -on auscultation, normal breath sounds, no adventitious sounds and normal vocal resonance.  Cardiovascular Cardiovascular examination reveals -normal heart sounds, regular rate and rhythm with no murmurs and no digital clubbing, cyanosis, edema, increased warmth or tenderness.  Abdomen Inspection Inspection of the abdomen reveals - No Hernias. Palpation/Percussion Palpation and Percussion of the abdomen reveal - Soft, Non Tender, No Rigidity (guarding), No hepatosplenomegaly and No Palpable abdominal masses.  Neurologic Neurologic evaluation reveals -alert and oriented x 3 with no impairment of recent or remote memory, normal attention span and ability to concentrate, normal sensation and normal coordination.  Musculoskeletal Normal Exam - Bilateral-Upper Extremity Strength Normal and Lower Extremity Strength Normal.   Results [name] Ruff MD; 2/77/8242 2:20 PM) Procedures  Name Value Date ANOSCOPY, DIAGNOSTIC (35361) [ Hemorrhoids ] Procedure Other: Procedure: Anoscopy....Marland KitchenMarland KitchenSurgeon: Marcello Moores....Marland KitchenMarland KitchenAfter the risks and benefits were explained, verbal consent was obtained  for above procedure. A medical assistant chaperone was present thoroughout the entire procedure. ....Marland KitchenMarland KitchenAnesthesia: none....Marland KitchenMarland KitchenDiagnosis: Rectal bleeding....Marland KitchenMarland KitchenFindings: Grade 3 right posterior internal hemorrhoid with large external component, grade 2 left lateral internal hemorrhoid, grade 1 right anterior internal hemorrhoid  Performed: 04/08/2017 2:19 PM    Assessment & Plan [name] Ruff MD; 4/43/1540 2:19 PM)  PROLAPSED INTERNAL HEMORRHOIDS, GRADE 3 (G86.7) Impression: 72 year old female who presents to the office with rectal bleeding and grade 3 prolapsed right posterior internal hemorrhoid. She has a grade 2 hemorrhoid in the left lateral position as well. This is causing her quite a bit of discomfort. I recommended excision of the hemorrhoid column and possible hemorrhoid pexy of the other hemorrhoids. We discussed this in detail including postoperative bleeding and postoperative pain. I believe she understands the risk and agrees to proceed with surgery.

## 2017-04-08 NOTE — H&P (Signed)
History of Present Illness Leighton Ruff MD; 9/62/9528 2:06 PM) The patient is a 72 year old female who presents with anal pain. 72 year old female who presents to the office with complaints of chronic anal pain and bleeding. This is been going on for several years. She reports regular bowel habits and denies any history of constipation or straining. She reports bright red blood on the toilet paper and no blood in her stool. Her last colonoscopy was in 2013 and normal. She does take an aspirin daily. She reports a hemorrhoid that has to be reduced after bowel movements.   Past Surgical History Mammie Lorenzo, LPN; 05/09/2438 1:02 PM) Carotid Artery Surgery Bilateral. Foot Surgery Bilateral. Oral Surgery Tonsillectomy  Diagnostic Studies History Mammie Lorenzo, LPN; 08/20/3662 4:03 PM) Colonoscopy 1-5 years ago Mammogram 1-3 years ago Pap Smear 1-5 years ago  Allergies Mammie Lorenzo, LPN; 4/74/2595 6:38 PM) Sulfa Antibiotics Swelling. Allergies Reconciled  Medication History Mammie Lorenzo, LPN; 7/56/4332 9:51 PM) BuPROPion HCl ER (XL) (300MG  Tablet ER 24HR, Oral) Active. TraZODone HCl (50MG  Tablet, Oral) Active. Omeprazole (40MG  Capsule DR, Oral) Active. Lisinopril (10MG  Tablet, Oral) Active. Aspirin (325MG  Tablet, Oral) Active. Plavix (75MG  Tablet, Oral) Active. Rosuvastatin Calcium (20MG  Tablet, Oral) Active. [facility] Vitamin B12 (1000MCG Tablet, Oral) Active. Metoprolol Succinate (25MG  CP24 Sprinkle, Oral) Active. Medications Reconciled  Social History Mammie Lorenzo, LPN; 8/84/1660 6:30 PM) Caffeine use Carbonated beverages, Coffee. No alcohol use No drug use Tobacco use Former smoker.  Family History Mammie Lorenzo, LPN; 1/60/1093 2:35 PM) Arthritis Brother, Mother, Sister. Depression Sister. Hypertension Brother. Respiratory Condition Brother, Mother, Sister.  Pregnancy / Birth History Mammie Lorenzo, LPN; 5/73/2202 5:42 PM) Age at  menarche 55 years. Age of menopause <45 Gravida 43 Maternal age 55-35 Para 0 Regular periods  Other Problems Mammie Lorenzo, LPN; 08/01/2374 2:83 PM) Arthritis Gastroesophageal Reflux Disease Hemorrhoids High blood pressure Hypercholesterolemia     Review of Systems Claiborne Billings Dockery LPN; 1/51/7616 0:73 PM) General Not Present- Appetite Loss, Chills, Fatigue, Fever, Night Sweats, Weight Gain and Weight Loss. Skin Not Present- Change in Wart/Mole, Dryness, Hives, Jaundice, New Lesions, Non-Healing Wounds, Rash and Ulcer. HEENT Present- Wears glasses/contact lenses. Not Present- Earache, Hearing Loss, Hoarseness, Nose Bleed, Oral Ulcers, Ringing in the Ears, Seasonal Allergies, Sinus Pain, Sore Throat, Visual Disturbances and Yellow Eyes. Respiratory Not Present- Bloody sputum, Chronic Cough, Difficulty Breathing, Snoring and Wheezing. Breast Present- Breast Pain. Not Present- Breast Mass, Nipple Discharge and Skin Changes. Cardiovascular Not Present- Chest Pain, Difficulty Breathing Lying Down, Leg Cramps, Palpitations, Rapid Heart Rate, Shortness of Breath and Swelling of Extremities. Gastrointestinal Present- Hemorrhoids. Not Present- Abdominal Pain, Bloating, Bloody Stool, Change in Bowel Habits, Chronic diarrhea, Constipation, Difficulty Swallowing, Excessive gas, Gets full quickly at meals, Indigestion, Nausea, Rectal Pain and Vomiting. Female Genitourinary Not Present- Frequency, Nocturia, Painful Urination, Pelvic Pain and Urgency. Musculoskeletal Present- Joint Pain. Not Present- Back Pain, Joint Stiffness, Muscle Pain, Muscle Weakness and Swelling of Extremities. Neurological Not Present- Decreased Memory, Fainting, Headaches, Numbness, Seizures, Tingling, Tremor, Trouble walking and Weakness. Psychiatric Not Present- Anxiety, Bipolar, Change in Sleep Pattern, Depression, Fearful and Frequent crying. Endocrine Not Present- Cold Intolerance, Excessive Hunger, Hair Changes,  Heat Intolerance, Hot flashes and New Diabetes. Hematology Present- Blood Thinners and Easy Bruising. Not Present- Excessive bleeding, Gland problems, HIV and Persistent Infections.  Vitals Claiborne Billings Dockery LPN; 08/05/6267 4:85 PM) 04/08/2017 1:55 PM Weight: 146.4 lb Height: 63in Body Surface Area: 1.69 m Body Mass Index: 25.93 kg/m  Temp.: 97.28F(Temporal)  Pulse: 71 (Regular)  BP:  122/64 (Sitting, Left Arm, Standard)      Physical Exam Leighton Ruff MD; 1/85/6314 2:17 PM)  General Mental Status-Alert. General Appearance-Not in acute distress. Build & Nutrition-Well nourished. Posture-Normal posture. Gait-Normal.  Head and Neck Head-normocephalic, atraumatic with no lesions or palpable masses. Trachea-midline.  Chest and Lung Exam Chest and lung exam reveals -on auscultation, normal breath sounds, no adventitious sounds and normal vocal resonance.  Cardiovascular Cardiovascular examination reveals -normal heart sounds, regular rate and rhythm with no murmurs and no digital clubbing, cyanosis, edema, increased warmth or tenderness.  Abdomen Inspection Inspection of the abdomen reveals - No Hernias. Palpation/Percussion Palpation and Percussion of the abdomen reveal - Soft, Non Tender, No Rigidity (guarding), No hepatosplenomegaly and No Palpable abdominal masses.  Neurologic Neurologic evaluation reveals -alert and oriented x 3 with no impairment of recent or remote memory, normal attention span and ability to concentrate, normal sensation and normal coordination.  Musculoskeletal Normal Exam - Bilateral-Upper Extremity Strength Normal and Lower Extremity Strength Normal.   Results Leighton Ruff MD; 9/70/2637 2:20 PM) Procedures  Name Value Date ANOSCOPY, DIAGNOSTIC (85885) [ Hemorrhoids ] Procedure Other: Procedure: Anoscopy....Marland KitchenMarland KitchenSurgeon: Marcello Moores....Marland KitchenMarland KitchenAfter the risks and benefits were explained, verbal consent was obtained  for above procedure. A medical assistant chaperone was present thoroughout the entire procedure. ....Marland KitchenMarland KitchenAnesthesia: none....Marland KitchenMarland KitchenDiagnosis: Rectal bleeding....Marland KitchenMarland KitchenFindings: Grade 3 right posterior internal hemorrhoid with large external component, grade 2 left lateral internal hemorrhoid, grade 1 right anterior internal hemorrhoid  Performed: 04/08/2017 2:19 PM    Assessment & Plan Leighton Ruff MD; 0/27/7412 2:19 PM)  PROLAPSED INTERNAL HEMORRHOIDS, GRADE 3 (I78.6) Impression: 72 year old female who presents to the office with rectal bleeding and grade 3 prolapsed right posterior internal hemorrhoid. She has a grade 2 hemorrhoid in the left lateral position as well. This is causing her quite a bit of discomfort. I recommended excision of the hemorrhoid column and possible hemorrhoid pexy of the other hemorrhoids. We discussed this in detail including postoperative bleeding and postoperative pain. I believe she understands the risk and agrees to proceed with surgery.

## 2017-04-29 ENCOUNTER — Other Ambulatory Visit: Payer: Self-pay

## 2017-04-29 ENCOUNTER — Encounter (HOSPITAL_BASED_OUTPATIENT_CLINIC_OR_DEPARTMENT_OTHER): Payer: Self-pay | Admitting: *Deleted

## 2017-04-29 NOTE — Progress Notes (Signed)
SPOKE W/ PT VIA PHONE FOR PRE-OP INTERVIEW.  NPO AFTER MN.  ARRIVE AT 0630.  NEEDS ISTAT AND EKG.  WILL TAKE WELLBUTRIN, TOPROL, PRILOSEC, AND CRESTOR AM DOS W/ SIPS OF WATER.  PT VERBALIZED UNDERSTANDING NOT TO STOP PLAVIX OR ASA AS PER DR THOMAS INSTRUCTIONS.

## 2017-05-06 NOTE — Anesthesia Preprocedure Evaluation (Addendum)
Anesthesia Evaluation  Patient identified by MRN, date of birth, ID band Patient awake    Reviewed: Allergy & Precautions, NPO status , Patient's Chart, lab work & pertinent test results, reviewed documented beta blocker date and time   Airway Mallampati: II  TM Distance: >3 FB Neck ROM: Full    Dental   Pulmonary former smoker,    breath sounds clear to auscultation       Cardiovascular hypertension, Pt. on medications and Pt. on home beta blockers + Peripheral Vascular Disease   Rhythm:Regular Rate:Normal     Neuro/Psych Anxiety TIA   GI/Hepatic Neg liver ROS, hiatal hernia, GERD  ,  Endo/Other  negative endocrine ROS  Renal/GU negative Renal ROS     Musculoskeletal  (+) Arthritis ,   Abdominal   Peds  Hematology negative hematology ROS (+)   Anesthesia Other Findings   Reproductive/Obstetrics                            Lab Results  Component Value Date   WBC 5.5 06/17/2011   HGB 13.7 06/17/2011   HCT 40.7 06/17/2011   MCV 90.1 06/17/2011   PLT 235.0 06/17/2011   Lab Results  Component Value Date   CREATININE 0.9 12/20/2012   BUN 12 12/20/2012   NA 139 12/20/2012   K 4.5 12/20/2012   CL 105 12/20/2012   CO2 28 12/20/2012    Anesthesia Physical Anesthesia Plan  ASA: III  Anesthesia Plan: MAC   Post-op Pain Management:    Induction: Intravenous  PONV Risk Score and Plan: 2 and Propofol infusion, Ondansetron and Treatment may vary due to age or medical condition  Airway Management Planned: Natural Airway and Nasal Cannula  Additional Equipment:   Intra-op Plan:   Post-operative Plan:   Informed Consent: I have reviewed the patients History and Physical, chart, labs and discussed the procedure including the risks, benefits and alternatives for the proposed anesthesia with the patient or authorized representative who has indicated his/her understanding and acceptance.      Plan Discussed with: CRNA  Anesthesia Plan Comments:        Anesthesia Quick Evaluation

## 2017-05-07 ENCOUNTER — Encounter (HOSPITAL_BASED_OUTPATIENT_CLINIC_OR_DEPARTMENT_OTHER): Payer: Self-pay | Admitting: *Deleted

## 2017-05-07 ENCOUNTER — Encounter (HOSPITAL_BASED_OUTPATIENT_CLINIC_OR_DEPARTMENT_OTHER)
Admission: RE | Disposition: A | Discharge status: Home / Self Care | Payer: Self-pay | Source: Ambulatory Visit | Attending: General Surgery

## 2017-05-07 ENCOUNTER — Ambulatory Visit (HOSPITAL_BASED_OUTPATIENT_CLINIC_OR_DEPARTMENT_OTHER): Payer: Medicare Other | Admitting: Anesthesiology

## 2017-05-07 ENCOUNTER — Ambulatory Visit (HOSPITAL_BASED_OUTPATIENT_CLINIC_OR_DEPARTMENT_OTHER)
Admission: RE | Admit: 2017-05-07 | Discharge: 2017-05-07 | Disposition: A | Discharge status: Home / Self Care | Payer: Medicare Other | Source: Ambulatory Visit | Attending: General Surgery | Admitting: General Surgery

## 2017-05-07 DIAGNOSIS — Z79899 Other long term (current) drug therapy: Secondary | ICD-10-CM | POA: Diagnosis not present

## 2017-05-07 DIAGNOSIS — K642 Third degree hemorrhoids: Secondary | ICD-10-CM | POA: Diagnosis not present

## 2017-05-07 DIAGNOSIS — E7800 Pure hypercholesterolemia, unspecified: Secondary | ICD-10-CM | POA: Diagnosis not present

## 2017-05-07 DIAGNOSIS — Z8673 Personal history of transient ischemic attack (TIA), and cerebral infarction without residual deficits: Secondary | ICD-10-CM | POA: Insufficient documentation

## 2017-05-07 DIAGNOSIS — Z7902 Long term (current) use of antithrombotics/antiplatelets: Secondary | ICD-10-CM | POA: Diagnosis not present

## 2017-05-07 DIAGNOSIS — I739 Peripheral vascular disease, unspecified: Secondary | ICD-10-CM | POA: Diagnosis not present

## 2017-05-07 DIAGNOSIS — K625 Hemorrhage of anus and rectum: Secondary | ICD-10-CM | POA: Insufficient documentation

## 2017-05-07 DIAGNOSIS — Z87891 Personal history of nicotine dependence: Secondary | ICD-10-CM | POA: Insufficient documentation

## 2017-05-07 DIAGNOSIS — Z7982 Long term (current) use of aspirin: Secondary | ICD-10-CM | POA: Diagnosis not present

## 2017-05-07 DIAGNOSIS — K6289 Other specified diseases of anus and rectum: Secondary | ICD-10-CM | POA: Diagnosis present

## 2017-05-07 DIAGNOSIS — I10 Essential (primary) hypertension: Secondary | ICD-10-CM | POA: Diagnosis not present

## 2017-05-07 DIAGNOSIS — K219 Gastro-esophageal reflux disease without esophagitis: Secondary | ICD-10-CM | POA: Diagnosis not present

## 2017-05-07 HISTORY — DX: Complete loss of teeth, unspecified cause, unspecified class: K08.109

## 2017-05-07 HISTORY — DX: Complete loss of teeth, unspecified cause, unspecified class: Z97.2

## 2017-05-07 HISTORY — DX: Cerebrovascular disease, unspecified: I67.9

## 2017-05-07 HISTORY — DX: Personal history of other diseases of the circulatory system: Z86.79

## 2017-05-07 HISTORY — DX: Peripheral vascular disease, unspecified: I73.9

## 2017-05-07 HISTORY — DX: Personal history of transient ischemic attack (TIA), and cerebral infarction without residual deficits: Z86.73

## 2017-05-07 HISTORY — DX: Hemorrhage of anus and rectum: K62.5

## 2017-05-07 HISTORY — DX: Personal history of other diseases of the digestive system: Z87.19

## 2017-05-07 HISTORY — DX: Third degree hemorrhoids: K64.2

## 2017-05-07 HISTORY — DX: Atherosclerosis of aorta: I70.0

## 2017-05-07 HISTORY — PX: HEMORRHOID SURGERY: SHX153

## 2017-05-07 HISTORY — DX: Unspecified osteoarthritis, unspecified site: M19.90

## 2017-05-07 HISTORY — DX: Presence of spectacles and contact lenses: Z97.3

## 2017-05-07 HISTORY — DX: Occlusion and stenosis of bilateral carotid arteries: I65.23

## 2017-05-07 HISTORY — DX: Personal history of other medical treatment: Z92.89

## 2017-05-07 HISTORY — DX: Atherosclerosis of other arteries: I70.8

## 2017-05-07 LAB — POCT I-STAT 4, (NA,K, GLUC, HGB,HCT)
GLUCOSE: 98 mg/dL (ref 65–99)
HCT: 36 % (ref 36.0–46.0)
Hemoglobin: 12.2 g/dL (ref 12.0–15.0)
Potassium: 4.2 mmol/L (ref 3.5–5.1)
Sodium: 142 mmol/L (ref 135–145)

## 2017-05-07 SURGERY — HEMORRHOIDECTOMY
Anesthesia: Monitor Anesthesia Care | Site: Rectum

## 2017-05-07 MED ORDER — LIDOCAINE 2% (20 MG/ML) 5 ML SYRINGE
INTRAMUSCULAR | Status: AC
  Filled 2017-05-07: qty 5

## 2017-05-07 MED ORDER — GABAPENTIN 300 MG PO CAPS
300.0000 mg | ORAL_CAPSULE | ORAL | Status: AC
  Administered 2017-05-07: 300 mg via ORAL
  Filled 2017-05-07: qty 1

## 2017-05-07 MED ORDER — PROPOFOL 500 MG/50ML IV EMUL
INTRAVENOUS | Status: AC
  Filled 2017-05-07: qty 50

## 2017-05-07 MED ORDER — FENTANYL CITRATE (PF) 100 MCG/2ML IJ SOLN
INTRAMUSCULAR | Status: DC | PRN
  Administered 2017-05-07: 25 ug via INTRAVENOUS

## 2017-05-07 MED ORDER — ACETAMINOPHEN 500 MG PO TABS
1000.0000 mg | ORAL_TABLET | ORAL | Status: AC
  Administered 2017-05-07: 1000 mg via ORAL
  Filled 2017-05-07: qty 2

## 2017-05-07 MED ORDER — OXYCODONE HCL 5 MG PO TABS: 5.0000 mg | ORAL_TABLET | Freq: Four times a day (QID) | ORAL | 0 refills | Status: DC | PRN

## 2017-05-07 MED ORDER — LACTATED RINGERS IV SOLN
INTRAVENOUS | Status: DC
  Administered 2017-05-07: 07:00:00 via INTRAVENOUS
  Filled 2017-05-07: qty 1000

## 2017-05-07 MED ORDER — BUPIVACAINE-EPINEPHRINE 0.5% -1:200000 IJ SOLN
INTRAMUSCULAR | Status: DC | PRN
  Administered 2017-05-07: 30 mL

## 2017-05-07 MED ORDER — FENTANYL CITRATE (PF) 100 MCG/2ML IJ SOLN
INTRAMUSCULAR | Status: AC
Start: 2017-05-07 — End: ?
  Filled 2017-05-07: qty 2

## 2017-05-07 MED ORDER — BUPIVACAINE LIPOSOME 1.3 % IJ SUSP
INTRAMUSCULAR | Status: DC | PRN
  Administered 2017-05-07: 20 mL

## 2017-05-07 MED ORDER — PROPOFOL 500 MG/50ML IV EMUL
INTRAVENOUS | Status: DC | PRN
  Administered 2017-05-07: 50 ug/kg/min via INTRAVENOUS

## 2017-05-07 MED ORDER — PROPOFOL 10 MG/ML IV BOLUS
INTRAVENOUS | Status: DC | PRN
  Administered 2017-05-07: 30 mg via INTRAVENOUS

## 2017-05-07 MED ORDER — MIDAZOLAM HCL 2 MG/2ML IJ SOLN
INTRAMUSCULAR | Status: AC
  Filled 2017-05-07: qty 2

## 2017-05-07 MED ORDER — ACETAMINOPHEN 500 MG PO TABS
ORAL_TABLET | ORAL | Status: AC
  Filled 2017-05-07: qty 2

## 2017-05-07 MED ORDER — GABAPENTIN 300 MG PO CAPS
ORAL_CAPSULE | ORAL | Status: AC
  Filled 2017-05-07: qty 1

## 2017-05-07 MED ORDER — LIDOCAINE 5 % EX OINT
TOPICAL_OINTMENT | CUTANEOUS | Status: DC | PRN
  Administered 2017-05-07: 1

## 2017-05-07 SURGICAL SUPPLY — 32 items
BLADE HEX COATED 2.75 (ELECTRODE) ×2 IMPLANT
BRIEF STRETCH FOR OB PAD LRG (UNDERPADS AND DIAPERS) ×2 IMPLANT
COVER BACK TABLE 60X90IN (DRAPES) ×2 IMPLANT
COVER MAYO STAND STRL (DRAPES) ×2 IMPLANT
DRAPE LAPAROTOMY 100X72 PEDS (DRAPES) ×2 IMPLANT
DRAPE UTILITY XL STRL (DRAPES) ×2 IMPLANT
ELECT REM PT RETURN 9FT ADLT (ELECTROSURGICAL) ×2
ELECTRODE REM PT RTRN 9FT ADLT (ELECTROSURGICAL) ×1 IMPLANT
GAUZE SPONGE 4X4 12PLY STRL LF (GAUZE/BANDAGES/DRESSINGS) ×2 IMPLANT
GAUZE SPONGE 4X4 16PLY XRAY LF (GAUZE/BANDAGES/DRESSINGS) ×2 IMPLANT
GLOVE BIO SURGEON STRL SZ 6.5 (GLOVE) ×2 IMPLANT
GLOVE INDICATOR 7.0 STRL GRN (GLOVE) ×10 IMPLANT
GOWN STRL REUS W/TWL 2XL LVL3 (GOWN DISPOSABLE) ×2 IMPLANT
KIT TURNOVER CYSTO (KITS) ×2 IMPLANT
NEEDLE HYPO 22GX1.5 SAFETY (NEEDLE) ×2 IMPLANT
NS IRRIG 500ML POUR BTL (IV SOLUTION) ×2 IMPLANT
PACK BASIN DAY SURGERY FS (CUSTOM PROCEDURE TRAY) ×2 IMPLANT
PAD ABD 8X10 STRL (GAUZE/BANDAGES/DRESSINGS) ×2 IMPLANT
PENCIL BUTTON HOLSTER BLD 10FT (ELECTRODE) ×2 IMPLANT
SPONGE SURGIFOAM ABS GEL 100 (HEMOSTASIS) IMPLANT
SPONGE SURGIFOAM ABS GEL 12-7 (HEMOSTASIS) IMPLANT
SUT CHROMIC 2 0 SH (SUTURE) ×2 IMPLANT
SUT CHROMIC 3 0 SH 27 (SUTURE) ×2 IMPLANT
SUT VIC AB 2-0 SH 27 (SUTURE)
SUT VIC AB 2-0 SH 27XBRD (SUTURE) IMPLANT
SUT VIC AB 4-0 P-3 18XBRD (SUTURE) IMPLANT
SUT VIC AB 4-0 P3 18 (SUTURE)
SUT VIC AB 4-0 SH 18 (SUTURE) IMPLANT
SYR CONTROL 10ML LL (SYRINGE) ×2 IMPLANT
TRAY DSU PREP LF (CUSTOM PROCEDURE TRAY) ×2 IMPLANT
TUBE CONNECTING 12X1/4 (SUCTIONS) ×2 IMPLANT
YANKAUER SUCT BULB TIP NO VENT (SUCTIONS) ×2 IMPLANT

## 2017-05-07 NOTE — Interval H&P Note (Signed)
pt seen and examined.   no changes in medical history.   ready to proceed to the OR.  all questions answered.

## 2017-05-07 NOTE — Discharge Instructions (Addendum)

## 2017-05-07 NOTE — Op Note (Addendum)
05/07/2017  8:50 AM  PATIENT:  [name] [name]  72 y.o. female  Patient Care Team: Gaynelle Arabian, MD as PCP - General (Family Medicine)  PRE-OPERATIVE DIAGNOSIS:  grade 3 internal hemorrhoid, rectal bleeding  POST-OPERATIVE DIAGNOSIS:  grade 3 internal hemorrhoid, rectal bleeding  PROCEDURE:  ANAL EXAM UNDER ANESTHESIA, SINGLE COLUMN HEMORRHOIDECTOMY  Surgeon(s): Leighton Ruff, MD  ASSISTANT: none   ANESTHESIA:   local and MAC  SPECIMEN:  Source of Specimen:  R posterior hemorrhoid column  DISPOSITION OF SPECIMEN:  PATHOLOGY  COUNTS:  YES  PLAN OF CARE: Discharge to home after PACU  PATIENT DISPOSITION:  PACU - hemodynamically stable.  INDICATION: 72 y.o. F with symptomatic hemorrhoid   OR FINDINGS: Grade 3 R posterior internal hemorrhoid with large external component   DESCRIPTION: the patient was identified in the preoperative holding area and taken to the OR where they were laid on the operating room table.  MAC anesthesia was induced without difficulty. The patient was then positioned in prone jackknife position with buttocks gently taped apart.  The patient was then prepped and draped in usual sterile fashion.  SCDs were noted to be in place prior to the initiation of anesthesia. A surgical timeout was performed indicating the correct patient, procedure, positioning and need for preoperative antibiotics.  A rectal block was performed using Marcaine with epinephrine mixed with Exparel.    I began with a digital rectal exam.  There were no masses noted internally.  The patient had good sphincter tone.  I then placed a Hill-Ferguson anoscope into the anal canal and evaluated this completely.  The patient had a grade 3 right posterior internal hemorrhoid with a large external component involving most of the right perianal region.  I began by using an Allis clamp to elevate the hemorrhoid off of the sphincter complex.  I made an incision with a 10 blade scalpel around this and  dissected down to the level of the sphincter complex using Metzenbaum scissors.  Once the sphincters were posterior and the hemorrhoid was dissected away I divided the lateral edges and remove the entire external component with the internal.  This was then sent to pathology for further examination.  The internal component was closed using a running 2-0 chromic suture.  The external component was closed using a running 3-0 chromic suture.  The patient tolerated this well and was sent to the postanesthesia care unit in stable condition after awakening from anesthesia.  All counts were correct per operating room staff.  I have reviewed the [place] and no patient was identified

## 2017-05-07 NOTE — Transfer of Care (Signed)
Immediate Anesthesia Transfer of Care Note  Patient: [name] [name]  Procedure(s) Performed: Procedure(s) (LRB): ANAL EXAM UNDER ANESTHESIA, HEMORRHOIDECTOMY (N/A)  Patient Location: PACU  Anesthesia Type: MAC  Level of Consciousness: awake, alert , oriented and patient cooperative  Airway & Oxygen Therapy: Patient Spontanous Breathing and Patient connected to face mask oxygen  Post-op Assessment: Report given to PACU RN and Post -op Vital signs reviewed and stable  Post vital signs: Reviewed and stable  Complications: No apparent anesthesia complications Last Vitals:  Vitals Value Taken Time  BP 138/60 05/07/2017  9:00 AM  Temp    Pulse 67 05/07/2017  9:04 AM  Resp 12 05/07/2017  9:04 AM  SpO2 96 % 05/07/2017  9:04 AM  Vitals shown include unvalidated device data.  Last Pain:  Vitals:   05/07/17 0624  TempSrc: Oral

## 2017-05-07 NOTE — Anesthesia Postprocedure Evaluation (Signed)
Anesthesia Post Note  Patient: [name] [name]  Procedure(s) Performed: ANAL EXAM UNDER ANESTHESIA, HEMORRHOIDECTOMY (N/A Rectum)     Patient location during evaluation: PACU Anesthesia Type: MAC Level of consciousness: awake and alert Pain management: pain level controlled Vital Signs Assessment: post-procedure vital signs reviewed and stable Respiratory status: spontaneous breathing, nonlabored ventilation, respiratory function stable and patient connected to nasal cannula oxygen Cardiovascular status: stable and blood pressure returned to baseline Postop Assessment: no apparent nausea or vomiting Anesthetic complications: no    Last Vitals:  Vitals:   05/07/17 0917 05/07/17 0921  BP: 115/68   Pulse: 66 66  Resp: 17 15  Temp:    SpO2: 99% 97%    Last Pain:  Vitals:   05/07/17 0928  TempSrc:   PainSc: 0-No pain                 Tiajuana Amass

## 2017-05-07 NOTE — Anesthesia Postprocedure Evaluation (Signed)
Anesthesia Post Note  Patient: [place] O Guerrette  Procedure(s) Performed: ANAL EXAM UNDER ANESTHESIA, HEMORRHOIDECTOMY (N/A Rectum)     Patient location during evaluation: PACU Anesthesia Type: MAC Level of consciousness: awake and alert Pain management: pain level controlled Vital Signs Assessment: post-procedure vital signs reviewed and stable Respiratory status: spontaneous breathing, nonlabored ventilation, respiratory function stable and patient connected to nasal cannula oxygen Cardiovascular status: stable and blood pressure returned to baseline Postop Assessment: no apparent nausea or vomiting Anesthetic complications: no    Last Vitals:  Vitals:   05/07/17 0917 05/07/17 0921  BP: 115/68   Pulse: 66 66  Resp: 17 15  Temp:    SpO2: 99% 97%    Last Pain:  Vitals:   05/07/17 0928  TempSrc:   PainSc: 0-No pain                 Osric Klopf E     

## 2017-05-10 ENCOUNTER — Encounter (HOSPITAL_BASED_OUTPATIENT_CLINIC_OR_DEPARTMENT_OTHER): Payer: Self-pay | Admitting: General Surgery

## 2017-05-26 ENCOUNTER — Encounter: Payer: Self-pay | Admitting: Cardiology

## 2017-06-07 NOTE — Progress Notes (Signed)
HPI: FU cerebrovascular disease. 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 She does not have CAD and had a normal nuclear study 06/17/2006. 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 Echocardiogram July 2017 showed normal LV function, mild aortic and mitral regurgitation.  Abdominal ultrasound August 2017 showed greater than 50% bilateral common iliac arteries.  No aneurysm.  Carotid Dopplers February 2019 showed 40 to 59% bilateral stenosis.  Since last seen, the patient has dyspnea with more extreme activities but not with routine activities. It is relieved with rest. It is not associated with chest pain. There is no orthopnea, PND or pedal edema. There is no syncope or palpitations. There is no exertional chest pain.   Current Outpatient Medications  Medication Sig Dispense Refill  . AMBULATORY NON FORMULARY MEDICATION Inject 300 mg into the skin as directed. Medication Name: Inclisiran sodium vs placebo (Patient taking differently: Inject into the skin as directed. Per pt in [place]  cardiology research trial)    . aspirin 325 MG EC tablet Take 325 mg by mouth every morning.     Marland Kitchen buPROPion (WELLBUTRIN XL) 300 MG 24 hr tablet Take 1 tablet (300 mg total) by mouth daily. PATIENT NEEDS TO CONTACT OFFICE FOR ADDITIONAL REFILLS (Patient taking differently: Take 300 mg by mouth every morning. PATIENT NEEDS TO CONTACT OFFICE FOR ADDITIONAL REFILLS) 30 tablet 0  . clopidogrel (PLAVIX) 75 MG tablet TAKE ONE TABLET BY MOUTH  DAILY (Patient taking differently: TAKE ONE TABLET BY MOUTH  DAILY  --- takes in am) 90 tablet 0  . lisinopril (PRINIVIL,ZESTRIL) 10 MG tablet Take 1 tablet (10 mg total) by mouth daily. (Patient taking differently: Take 10 mg by mouth every morning. ) 90 tablet 3  . metoprolol succinate (TOPROL-XL) 50 MG 24 hr tablet TAKE ONE  TABLET BY MOUTH  DAILY. TAKE WITH OR IMMEDIATELY FOLLOWING A MEAL (Patient taking differently: TAKE ONE TABLET BY MOUTH  DAILY. TAKE WITH OR IMMEDIATELY FOLLOWING A MEAL--- takes in am) 90 tablet 0  . omeprazole (PRILOSEC) 40 MG capsule TAKE ONE CAPSULE BY MOUTH ONCE DAILY (Patient taking differently: TAKE ONE CAPSULE BY MOUTH ONCE DAILY--- takes in am) 90 capsule 0  . rosuvastatin (CRESTOR) 20 MG tablet Take 20 mg by mouth every morning.    . traZODone (DESYREL) 50 MG tablet TAKE ONE-HALF TO ONE TABLET BY MOUTH AT BEDTIME AS NEEDED 90 tablet 0   No current facility-administered medications for this visit.      Past Medical History:  Diagnosis Date  . Anxiety   . Aorto-iliac atherosclerosis ([place])    last aaa duplex 08-30-2015  >50% stenosis bilateral common iliac arteries and left external iliac artery  . Carotid stenosis, bilateral    cardiologist-- dr Stanford Breed ([place] 08-06-2015)  04-29-2017 followed by pcp/  last duplex 03-08-2017 bilateral ICA 40-59% post endarterectomy,  right ECA occluded  . Cerebral vascular disease   . Chronic gastritis   . Full dentures   . GERD (gastroesophageal reflux disease)   . Hiatal hernia   . History of aortic valve disorder    per cardiology note dated 08-06-2015 by dr Stanford Breed pt has hx small fibroblastoma aortic valve per echo 2008 /  last echo 08-23-2015 no fibroblastoma noted or stenosis or thickened/ calcification  . History of gastric ulcer 2013  . History of nuclear stress test 06-17-2006   per  dr Stanford Breed note dated 08-06-2015 , normal study  . History of transient ischemic attack (TIA) 04/25/2006   severe left ICA stenosis/   04-29-2017 per pt no residual  . HLD (hyperlipidemia)   . HTN (hypertension)   . IBS (irritable bowel syndrome)   . OA (osteoarthritis)   . Prolapsed internal hemorrhoids, grade 3   . PVD (peripheral vascular disease) ([place])   . Rectal bleed   . Wears contact lenses     Past Surgical History:  Procedure Laterality  Date  . CAROTID ENDARTERECTOMY Bilateral left 04-29-2006 and right 08-17-2007 by  dr c. Scot Dock  [facility]  . HEMORRHOID SURGERY N/A 05/07/2017   Procedure: ANAL EXAM UNDER ANESTHESIA, HEMORRHOIDECTOMY;  Surgeon: Leighton Ruff, MD;  Location: [facility];  Service: General;  Laterality: N/A;  . NASAL SINUS SURGERY  age 27  . TONSILLECTOMY  age 7  . TRANSTHORACIC ECHOCARDIOGRAM  08/23/2015   ef 25-42%, grade 1 diastolic dysfunction/ mild AR and MR  . VASCULAR SURGERY Bilateral    carotids  . WRIST GANGLION EXCISION Right yrs ago    Social History   Socioeconomic History  . Marital status: Widowed    Spouse name: Not on file  . Number of children: Not on file  . Years of education: Not on file  . Highest education level: Not on file  Occupational History  . Not on file  Social Needs  . Financial resource strain: Not on file  . Food insecurity:    Worry: Not on file    Inability: Not on file  . Transportation needs:    Medical: Not on file    Non-medical: Not on file  Tobacco Use  . Smoking status: Former Smoker    Years: 30.00    Types: Cigarettes    Last attempt to quit: 04/02/2012    Years since quitting: 5.2  . Smokeless tobacco: Never Used  Substance and Sexual Activity  . Alcohol use: No  . Drug use: No  . Sexual activity: Not on file  Lifestyle  . Physical activity:    Days per week: Not on file    Minutes per session: Not on file  . Stress: Not on file  Relationships  . Social connections:    Talks on phone: Not on file    Gets together: Not on file    Attends religious service: Not on file    Active member of club or organization: Not on file    Attends meetings of clubs or organizations: Not on file    Relationship status: Not on file  . Intimate partner violence:    Fear of current or ex partner: Not on file    Emotionally abused: Not on file    Physically abused: Not on file    Forced sexual activity: Not on file  Other Topics Concern  .  Not on file  Social History Narrative   Recent widow.    Works for [employer].     Family History  Problem Relation Age of Onset  . Stroke Father   . Diabetes Mother   . Asthma Mother   . Heart disease Brother   . Diabetes Unknown   . Breast cancer Other   . Colon cancer Neg Hx     ROS: Knee arthralgias but no fevers or chills, productive cough, hemoptysis, dysphasia, odynophagia, melena, hematochezia, dysuria, hematuria, rash, seizure activity, orthopnea, PND, pedal edema, claudication. Remaining systems are negative.  Physical Exam: Well-developed well-nourished  in no acute distress.  Skin is warm and dry.  HEENT is normal.  Neck is supple.  Chest is clear to auscultation with normal expansion.  Cardiovascular exam is regular rate and rhythm.  Abdominal exam nontender or distended. No masses palpated. Extremities show no edema. neuro grossly intact  A/P  1 carotid artery disease-continue aspirin (decrease to 81 mg daily) and statin.  Schedule follow-up carotid Dopplers February 2020.  2 hypertension-blood pressure is controlled.  Continue present medications.  3 hyperlipidemia-continue statin.  She did not tolerate high-dose in the past. Pt is in [place] 10 study.  4 history of small aortic valve fibroblastoma-mild aortic insufficiency on most recent echocardiogram.  We will plan follow-up studies in the future.  5 peripheral vascular disease-no claudication.  Continue aspirin and statin.  Kirk Ruths, MD

## 2017-06-15 ENCOUNTER — Encounter: Payer: Self-pay | Admitting: Cardiology

## 2017-06-15 ENCOUNTER — Ambulatory Visit: Payer: Medicare Other | Admitting: Cardiology

## 2017-06-15 VITALS — BP 126/62 | HR 77 | Ht 63.0 in | Wt 147.0 lb

## 2017-06-15 DIAGNOSIS — E7800 Pure hypercholesterolemia, unspecified: Secondary | ICD-10-CM | POA: Diagnosis not present

## 2017-06-15 DIAGNOSIS — I10 Essential (primary) hypertension: Secondary | ICD-10-CM

## 2017-06-15 DIAGNOSIS — I739 Peripheral vascular disease, unspecified: Secondary | ICD-10-CM

## 2017-06-15 DIAGNOSIS — I679 Cerebrovascular disease, unspecified: Secondary | ICD-10-CM

## 2017-06-15 MED ORDER — ASPIRIN EC 81 MG PO TBEC: 81.0000 mg | DELAYED_RELEASE_TABLET | Freq: Every day | ORAL | 3 refills | Status: AC

## 2017-06-15 NOTE — Patient Instructions (Addendum)
Medication Instructions:   DECREASE ASPIRIN TO 81 MG ONCE DAILY  Follow-Up:  Your physician wants you to follow-up in: [place] will receive a reminder letter in the mail two months in advance. If you don't receive a letter, please call our office to schedule the follow-up appointment.   DR Montura [phone]

## 2017-06-18 VITALS — BP 150/60 | HR 70 | Temp 97.9°F | Wt 147.0 lb

## 2017-06-18 DIAGNOSIS — Z006 Encounter for examination for normal comparison and control in clinical research program: Secondary | ICD-10-CM

## 2017-06-18 NOTE — Progress Notes (Signed)
[name] [name] to research clinic for visit 7 D450 in the [place] study.  No complaints, adverse events, or serious adverse events to report.  Subject given injection and observed in clinic for 30 minutes per protocol.  Next appointment scheduled.

## 2017-08-05 ENCOUNTER — Encounter: Payer: Medicare Other | Admitting: *Deleted

## 2017-08-05 VITALS — BP 147/63 | HR 63 | Temp 97.9°F | Resp 18 | Wt 148.0 lb

## 2017-08-05 DIAGNOSIS — Z006 Encounter for examination for normal comparison and control in clinical research program: Secondary | ICD-10-CM

## 2017-08-05 NOTE — Progress Notes (Unsigned)
Patient to clinic for V8D510 in the [facility] study.  No cos, aes or saes to report.  Spoke with patient re: EOS and [place] 8 study.

## 2017-09-15 ENCOUNTER — Other Ambulatory Visit: Payer: Self-pay

## 2017-09-15 VITALS — BP 153/56 | HR 67 | Temp 97.9°F | Resp 16 | Ht 63.0 in | Wt 149.0 lb

## 2017-09-15 DIAGNOSIS — Z006 Encounter for examination for normal comparison and control in clinical research program: Secondary | ICD-10-CM

## 2017-09-15 NOTE — Progress Notes (Signed)
[name] [name] to research clinic for visit V9EOS in the [place] 10 study.  No complaints, adverse events, or serious adverse events to report.     Subject met inclusion and exclusion criteria. The informed consent form, study requirements and expectations were reviewed with the subject and questions and concerns were addressed prior to the signing of the consent form.  The subject verbalized understanding of the trial requirements.  The subject agreed to participate in the [place] 8  trial and signed the informed consent.  The informed consent was obtained prior to performance of any protocol-specific procedures for the subject.  A copy of the signed informed consent was given to the subject and a copy was placed in the subject's medical record.   Study Entry-Day1 in [place] 8 study. Subject given injection and observed in clinic for 30 minutes per protocol.  Next appointment scheduled.

## 2017-12-14 VITALS — BP 133/44 | HR 79 | Temp 98.0°F | Resp 20 | Wt 148.4 lb

## 2017-12-14 DIAGNOSIS — Z006 Encounter for examination for normal comparison and control in clinical research program: Secondary | ICD-10-CM

## 2017-12-14 NOTE — Research (Signed)
[name] [name] to research clinic for visit 2 Day 53 in the [place] 8 study.  No complaints, adverse events, or serious adverse events to report.  Injection given in clinic, patient observed for 30 minutes after per protocol.  Next appointment scheduled.

## 2018-03-01 ENCOUNTER — Telehealth: Payer: Self-pay

## 2018-03-01 NOTE — Progress Notes (Signed)
See duplicate note same date

## 2018-03-01 NOTE — Telephone Encounter (Signed)
Author phoned pt. to assess interest in scheduling initial AWV with author prior to or after appointment with Dr. Ethlyn Gallery. Pt. stated she would stay for 10AM appointment. Appointment scheduled.

## 2018-03-02 ENCOUNTER — Encounter: Payer: Self-pay | Admitting: Family Medicine

## 2018-03-02 ENCOUNTER — Ambulatory Visit (INDEPENDENT_AMBULATORY_CARE_PROVIDER_SITE_OTHER): Payer: Medicare Other

## 2018-03-02 ENCOUNTER — Ambulatory Visit: Payer: Medicare Other | Admitting: Family Medicine

## 2018-03-02 VITALS — BP 124/60 | HR 83 | Temp 98.4°F | Ht 63.0 in | Wt 145.0 lb

## 2018-03-02 VITALS — BP 124/60 | HR 83 | Temp 98.4°F | Ht 63.0 in | Wt 145.4 lb

## 2018-03-02 DIAGNOSIS — Z0000 Encounter for general adult medical examination without abnormal findings: Secondary | ICD-10-CM | POA: Diagnosis not present

## 2018-03-02 DIAGNOSIS — N644 Mastodynia: Secondary | ICD-10-CM | POA: Diagnosis not present

## 2018-03-02 DIAGNOSIS — Z13820 Encounter for screening for osteoporosis: Secondary | ICD-10-CM

## 2018-03-02 DIAGNOSIS — K219 Gastro-esophageal reflux disease without esophagitis: Secondary | ICD-10-CM

## 2018-03-02 DIAGNOSIS — M25561 Pain in right knee: Secondary | ICD-10-CM | POA: Diagnosis not present

## 2018-03-02 DIAGNOSIS — Z23 Encounter for immunization: Secondary | ICD-10-CM

## 2018-03-02 DIAGNOSIS — I10 Essential (primary) hypertension: Secondary | ICD-10-CM

## 2018-03-02 DIAGNOSIS — G8929 Other chronic pain: Secondary | ICD-10-CM

## 2018-03-02 MED ORDER — ZOSTER VAC RECOMB ADJUVANTED 50 MCG/0.5ML IM SUSR: 0.5000 mL | Freq: Once | INTRAMUSCULAR | 0 refills | Status: AC

## 2018-03-02 MED ORDER — PNEUMOCOCCAL 13-VAL CONJ VACC IM SUSP
0.5000 mL | Freq: Once | INTRAMUSCULAR | Status: AC
Start: 2018-03-02 — End: 2018-03-02
  Administered 2018-03-02: 0.5 mL via INTRAMUSCULAR
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
## 2018-03-02 NOTE — Patient Instructions (Addendum)
Bring a copy of your living will and/or healthcare power of attorney to your next office visit.  Let us know what you find out from the clinical trial regarding medication and bone pain.  Will see if [employer] will cover shingles vaccine, DEXA scan, and hepatitis C screen. DEXA (bone density) should be ok.  Bone density order placed; [facility] will call to schedule an appointment.  Great to meet you! 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   Health Maintenance, Female Adopting a healthy lifestyle and getting preventive care can go a long way to promote health and wellness. Talk with your health care provider about what schedule of regular examinations is right for you. This is a good chance for you to check in with your provider about disease prevention and staying healthy. In between checkups, there are plenty of things you can do on your own. Experts have done a lot of research about which lifestyle changes and preventive measures are most likely to keep you healthy. Ask your health care provider for more information. Weight and diet Eat a healthy diet  Be sure to include plenty of vegetables, fruits, low-fat dairy products, and lean protein.  Do not eat a lot of foods high in solid fats, added sugars, or salt.  Get regular exercise. This is one of the most important things you can do for your health. ? Most adults should exercise for at least 150 minutes each week. The exercise should increase your heart rate and make you sweat (moderate-intensity exercise). ? Most adults should also do strengthening exercises at least twice a week. This is in addition to the moderate-intensity exercise. Maintain a healthy weight  Body mass index (BMI) is a measurement that can be used to identify possible weight problems. It estimates body fat based on height and weight. Your health care provider can help determine your BMI and help you achieve or maintain a healthy  weight.  For females 69 years of age and older: ? A BMI below 18.5 is considered underweight. ? A BMI of 18.5 to 24.9 is normal. ? A BMI of 25 to 29.9 is considered overweight. ? A BMI of 30 and above is considered obese. Watch levels of cholesterol and blood lipids  You should start having your blood tested for lipids and cholesterol at 74 years of age, then have this test every 5 years.  You may need to have your cholesterol levels checked more often if: ? Your lipid or cholesterol levels are high. ? You are older than 74 years of age. ? You are at high risk for heart disease. Cancer screening Lung Cancer  Lung cancer screening is recommended for adults 2-55 years old who are at high risk for lung cancer because of a history of smoking.  A yearly low-dose CT scan of the lungs is recommended for people who: ? Currently smoke. ? Have quit within the past 15 years. ? Have at least a 30-pack-year history of smoking. A pack year is smoking an average of one pack of cigarettes a day for 1 year.  Yearly screening should continue until it has been 15 years since you quit.  Yearly screening should stop if you develop a health problem that would prevent you from having lung cancer treatment. Breast Cancer  Practice breast self-awareness. This means understanding how your breasts normally appear and feel.  It also means doing regular breast self-exams. Let your health care provider know about any changes, no  matter how small.  If you are in your 20s or 30s, you should have a clinical breast exam (CBE) by a health care provider every 1-3 years as part of a regular health exam.  If you are 45 or older, have a CBE every year. Also consider having a breast X-ray (mammogram) every year.  If you have a family history of breast cancer, talk to your health care provider about genetic screening.  If you are at high risk for breast cancer, talk to your health care provider about having an MRI  and a mammogram every year.  Breast cancer gene (BRCA) assessment is recommended for women who have family members with BRCA-related cancers. BRCA-related cancers include: ? Breast. ? Ovarian. ? Tubal. ? Peritoneal cancers.  Results of the assessment will determine the need for genetic counseling and BRCA1 and BRCA2 testing. Cervical Cancer Your health care provider may recommend that you be screened regularly for cancer of the pelvic organs (ovaries, uterus, and vagina). This screening involves a pelvic examination, including checking for microscopic changes to the surface of your cervix (Pap test). You may be encouraged to have this screening done every 3 years, beginning at age 57.  For women ages 59-65, health care providers may recommend pelvic exams and Pap testing every 3 years, or they may recommend the Pap and pelvic exam, combined with testing for human papilloma virus (HPV), every 5 years. Some types of HPV increase your risk of cervical cancer. Testing for HPV may also be done on women of any age with unclear Pap test results.  Other health care providers may not recommend any screening for nonpregnant women who are considered low risk for pelvic cancer and who do not have symptoms. Ask your health care provider if a screening pelvic exam is right for you.  If you have had past treatment for cervical cancer or a condition that could lead to cancer, you need Pap tests and screening for cancer for at least 20 years after your treatment. If Pap tests have been discontinued, your risk factors (such as having a new sexual partner) need to be reassessed to determine if screening should resume. Some women have medical problems that increase the chance of getting cervical cancer. In these cases, your health care provider may recommend more frequent screening and Pap tests. Colorectal Cancer  This type of cancer can be detected and often prevented.  Routine colorectal cancer screening usually  begins at 74 years of age and continues through 74 years of age.  Your health care provider may recommend screening at an earlier age if you have risk factors for colon cancer.  Your health care provider may also recommend using home test kits to check for hidden blood in the stool.  A small camera at the end of a tube can be used to examine your colon directly (sigmoidoscopy or colonoscopy). This is done to check for the earliest forms of colorectal cancer.  Routine screening usually begins at age 82.  Direct examination of the colon should be repeated every 5-10 years through 74 years of age. However, you may need to be screened more often if early forms of precancerous polyps or small growths are found. Skin Cancer  Check your skin from head to toe regularly.  Tell your health care provider about any new moles or changes in moles, especially if there is a change in a mole's shape or color.  Also tell your health care provider if you have a mole that  is larger than the size of a pencil eraser.  Always use sunscreen. Apply sunscreen liberally and repeatedly throughout the day.  Protect yourself by wearing long sleeves, pants, a wide-brimmed hat, and sunglasses whenever you are outside. Heart disease, diabetes, and high blood pressure  High blood pressure causes heart disease and increases the risk of stroke. High blood pressure is more likely to develop in: ? People who have blood pressure in the high end of the normal range (130-139/85-89 mm Hg). ? People who are overweight or obese. ? People who are African American.  If you are 56-28 years of age, have your blood pressure checked every 3-5 years. If you are 22 years of age or older, have your blood pressure checked every year. You should have your blood pressure measured twice-once when you are at a hospital or clinic, and once when you are not at a hospital or clinic. Record the average of the two measurements. To check your blood  pressure when you are not at a hospital or clinic, you can use: ? An automated blood pressure machine at a pharmacy. ? A home blood pressure monitor.  If you are between 76 years and 7 years old, ask your health care provider if you should take aspirin to prevent strokes.  Have regular diabetes screenings. This involves taking a blood sample to check your fasting blood sugar level. ? If you are at a normal weight and have a low risk for diabetes, have this test once every three years after 74 years of age. ? If you are overweight and have a high risk for diabetes, consider being tested at a younger age or more often. Preventing infection Hepatitis B  If you have a higher risk for hepatitis B, you should be screened for this virus. You are considered at high risk for hepatitis B if: ? You were born in a country where hepatitis B is common. Ask your health care provider which countries are considered high risk. ? Your parents were born in a high-risk country, and you have not been immunized against hepatitis B (hepatitis B vaccine). ? You have HIV or AIDS. ? You use needles to inject street drugs. ? You live with someone who has hepatitis B. ? You have had sex with someone who has hepatitis B. ? You get hemodialysis treatment. ? You take certain medicines for conditions, including cancer, organ transplantation, and autoimmune conditions. Hepatitis C  Blood testing is recommended for: ? Everyone born from [birth year] through 1966. ? Anyone with known risk factors for hepatitis C. Sexually transmitted infections (STIs)  You should be screened for sexually transmitted infections (STIs) including gonorrhea and chlamydia if: ? You are sexually active and are younger than 74 years of age. ? You are older than 74 years of age and your health care provider tells you that you are at risk for this type of infection. ? Your sexual activity has changed since you were last screened and you are at an  increased risk for chlamydia or gonorrhea. Ask your health care provider if you are at risk.  If you do not have HIV, but are at risk, it may be recommended that you take a prescription medicine daily to prevent HIV infection. This is called pre-exposure prophylaxis (PrEP). You are considered at risk if: ? You are sexually active and do not regularly use condoms or know the HIV status of your partner(s). ? You take drugs by injection. ? You are sexually active with a  partner who has HIV. Talk with your health care provider about whether you are at high risk of being infected with HIV. If you choose to begin PrEP, you should first be tested for HIV. You should then be tested every 3 months for as long as you are taking PrEP. Pregnancy  If you are premenopausal and you may become pregnant, ask your health care provider about preconception counseling.  If you may become pregnant, take 400 to 800 micrograms (mcg) of folic acid every day.  If you want to prevent pregnancy, talk to your health care provider about birth control (contraception). Osteoporosis and menopause  Osteoporosis is a disease in which the bones lose minerals and strength with aging. This can result in serious bone fractures. Your risk for osteoporosis can be identified using a bone density scan.  If you are 24 years of age or older, or if you are at risk for osteoporosis and fractures, ask your health care provider if you should be screened.  Ask your health care provider whether you should take a calcium or vitamin D supplement to lower your risk for osteoporosis.  Menopause may have certain physical symptoms and risks.  Hormone replacement therapy may reduce some of these symptoms and risks. Talk to your health care provider about whether hormone replacement therapy is right for you. Follow these instructions at home:  Schedule regular health, dental, and eye exams.  Stay current with your immunizations.  Do not use  any tobacco products including cigarettes, chewing tobacco, or electronic cigarettes.  If you are pregnant, do not drink alcohol.  If you are breastfeeding, limit how much and how often you drink alcohol.  Limit alcohol intake to no more than 1 drink per day for nonpregnant women. One drink equals 12 ounces of beer, 5 ounces of wine, or 1 ounces of hard liquor.  Do not use street drugs.  Do not share needles.  Ask your health care provider for help if you need support or information about quitting drugs.  Tell your health care provider if you often feel depressed.  Tell your health care provider if you have ever been abused or do not feel safe at home. This information is not intended to replace advice given to you by your health care provider. Make sure you discuss any questions you have with your health care provider. Document Released: 07/28/2010 Document Revised: 06/20/2015 Document Reviewed: 10/16/2014 Elsevier Interactive Patient Education  2019 [employer].   Hearing Loss  Hearing loss is a partial or total loss of the ability to hear. This can be temporary or permanent, and it can happen in one or both ears. Hearing loss may be referred to as deafness. Medical care is necessary to treat hearing loss properly and to prevent the condition from getting worse. Your hearing may partially or completely come back, depending on what caused your hearing loss and how severe it is. In some cases, hearing loss is permanent. What are the causes? Common causes of hearing loss include:  Too much wax in the ear canal.  Infection of the ear canal or middle ear.  Fluid in the middle ear.  Injury to the ear or surrounding area.  An object stuck in the ear.  Prolonged exposure to loud sounds, such as music. Less common causes of hearing loss include:  Tumors in the ear.  Viral or bacterial infections, such as meningitis.  A hole in the eardrum (perforated eardrum).  Problems  with the hearing nerve that  sends signals between the brain and the ear.  Certain medicines. What are the signs or symptoms? Symptoms of this condition may include:  Difficulty telling the difference between sounds.  Difficulty following a conversation when there is background noise.  Lack of response to sounds in your environment. This may be most noticeable when you do not respond to startling sounds.  Needing to turn up the volume on the television, radio, etc.  Ringing in the ears.  Dizziness.  Pain in the ears. How is this diagnosed? This condition is diagnosed based on a physical exam and a hearing test (audiometry). The audiometry test will be performed by a hearing specialist (audiologist). You may also be referred to an ear, nose, and throat (ENT) specialist (otolaryngologist). How is this treated? Treatment for recent onset of hearing loss may include:  Ear wax removal.  Being prescribed medicines to prevent infection (antibiotics).  Being prescribed medicines to reduce inflammation (corticosteroids). Follow these instructions at home:  If you were prescribed an antibiotic medicine, take it as told by your health care provider. Do not stop taking the antibiotic even if you start to feel better.  Take over-the-counter and prescription medicines only as told by your health care provider.  Avoid loud noises.  Return to your normal activities as told by your health care provider. Ask your health care provider what activities are safe for you.  Keep all follow-up visits as told by your health care provider. This is important. Contact a health care provider if:  You feel dizzy.  You develop new symptoms.  You vomit or feel nauseous.  You have a fever. Get help right away if:  You develop sudden changes in your vision.  You have severe ear pain.  You have new or increased weakness.  You have a severe headache. This information is not intended to replace  advice given to you by your health care provider. Make sure you discuss any questions you have with your health care provider. Document Released: 01/12/2005 Document Revised: 06/20/2015 Document Reviewed: 05/30/2014 Elsevier Interactive Patient Education  2019 [employer].

## 2018-03-03 ENCOUNTER — Telehealth: Payer: Self-pay

## 2018-03-03 ENCOUNTER — Other Ambulatory Visit: Payer: Self-pay | Admitting: Family Medicine

## 2018-03-03 DIAGNOSIS — Z1239 Encounter for other screening for malignant neoplasm of breast: Secondary | ICD-10-CM

## 2018-03-03 DIAGNOSIS — N644 Mastodynia: Secondary | ICD-10-CM

## 2018-03-03 NOTE — Telephone Encounter (Signed)
Provide [name] [name] the information will give the phone to  The pt to call to check to see if her vaccines are covered . Awaiting Dr Ulice Brilliant to place the order  for MRI

## 2018-03-03 NOTE — Telephone Encounter (Signed)
Author phoned pt. To follow-up on questions regarding insurance coverage of hep c screening, shingles vaccine, MRI for screening of breast cancer, and DEXA screen. Per phone conversation by patient care referral coordinator, pt. can have MRI, but our office will need to process authorization first, so pt. should hear back soon when it is approved. DEXA scan will be covered, and shingles cost/coverage as well as hep C screening coverage, could be reviewed by calling [facility] at [phone]. All information relayed to pt., and pt. Appreciative.

## 2018-03-03 NOTE — Telephone Encounter (Signed)
Order has been placed.

## 2018-03-04 ENCOUNTER — Telehealth: Payer: Self-pay | Admitting: Family Medicine

## 2018-03-04 ENCOUNTER — Other Ambulatory Visit: Payer: Self-pay | Admitting: Family Medicine

## 2018-03-04 DIAGNOSIS — N644 Mastodynia: Secondary | ICD-10-CM

## 2018-03-04 DIAGNOSIS — Z1239 Encounter for other screening for malignant neoplasm of breast: Secondary | ICD-10-CM

## 2018-03-04 NOTE — Telephone Encounter (Signed)
I have placed new order; I do not know how to delete/cancel previous?

## 2018-03-04 NOTE — Telephone Encounter (Signed)
Copied from [place] [id number]. Topic: Referral - Status >> Mar 04, 2018  8:25 AM Robina Ade, Helene Kelp D wrote: Reason for CRM: Vaughan Basta with [facility] called and said that patients MRI Breast needs to be change to with & without contrast. If there are any question to call [phone] option 1 then option 2.

## 2018-03-05 ENCOUNTER — Ambulatory Visit: Payer: Medicare Other

## 2018-03-07 ENCOUNTER — Telehealth: Payer: Self-pay

## 2018-03-07 ENCOUNTER — Telehealth: Payer: Self-pay | Admitting: *Deleted

## 2018-03-07 DIAGNOSIS — Z1159 Encounter for screening for other viral diseases: Secondary | ICD-10-CM

## 2018-03-07 NOTE — Telephone Encounter (Addendum)
[name] [name] called to discuss ongoing discomfort in her arms and legs. She states the pain is constant and began approximately 2 months ago. She states pain is not in her joints. It is "between my ankle and knee/wrist and elbow." She was wondering if it could be related to investigational drug.  Will discuss with Dr. Lia Foyer and Dr. Stanford Breed.  We will hold the crestor for now to see if there is any improvement per Dr. Lia Foyer and Dr. Stanford Breed.

## 2018-03-07 NOTE — Telephone Encounter (Signed)
Pt. stated she would like to go ahead with hep C screening. Order placed, co-sign required. Pt. stated she just received shingrix vaccine as well from pharmacy, record updated.  Pt. stated she is currently following up with Dr. Lia Foyer, and per Dr. Lia Foyer, there is no record of bone pain from study intervention. Will notify PCP of update. Pt. following up with [facility] and cardiologist shortly.

## 2018-03-07 NOTE — Telephone Encounter (Signed)
Asking Neoma Laming for assistance.

## 2018-03-07 NOTE — Telephone Encounter (Signed)
[name] [name] has closed the referral. Nothing further needed.

## 2018-03-07 NOTE — Telephone Encounter (Signed)
Author phoned pt. to see if she would like author to place hep C screening order and to relay that MRI order had been placed by Dr. Ethlyn Gallery. No answer, author left detailed VM asking for return call if interested.   Copied from [place] [id number]. Topic: General - Other >> Mar 04, 2018  3:11 PM Yvette Rack wrote: Reason for CRM: pt calling stating that [place] coach wanted her to call back about information about her insurance she states that her insurance states that they will pay for shingles Injection but has to get injection from pharmacy   she also states that the insurance will pay for her Hep C injection

## 2018-03-07 NOTE — Telephone Encounter (Signed)
Patient called back to speak with Raynelle Dick say that she will be on the alert waiting for a call back.

## 2018-03-07 NOTE — Addendum Note (Signed)
Addended by: Raynelle Dick R on: 03/07/2018 05:14 PM   Modules accepted: Orders

## 2018-03-09 ENCOUNTER — Other Ambulatory Visit (INDEPENDENT_AMBULATORY_CARE_PROVIDER_SITE_OTHER): Payer: Medicare Other

## 2018-03-09 ENCOUNTER — Ambulatory Visit (HOSPITAL_COMMUNITY)
Admission: RE | Admit: 2018-03-09 | Discharge: 2018-03-09 | Disposition: A | Discharge status: Home / Self Care | Payer: Medicare Other | Source: Ambulatory Visit | Attending: Cardiovascular Disease | Admitting: Cardiovascular Disease

## 2018-03-09 ENCOUNTER — Other Ambulatory Visit: Payer: Self-pay | Admitting: *Deleted

## 2018-03-09 DIAGNOSIS — I679 Cerebrovascular disease, unspecified: Secondary | ICD-10-CM | POA: Diagnosis not present

## 2018-03-09 DIAGNOSIS — Z1159 Encounter for screening for other viral diseases: Secondary | ICD-10-CM | POA: Diagnosis not present

## 2018-03-10 ENCOUNTER — Other Ambulatory Visit: Payer: Self-pay | Admitting: Family Medicine

## 2018-03-10 DIAGNOSIS — R5383 Other fatigue: Secondary | ICD-10-CM

## 2018-03-10 DIAGNOSIS — M2550 Pain in unspecified joint: Secondary | ICD-10-CM

## 2018-03-10 DIAGNOSIS — M898X9 Other specified disorders of bone, unspecified site: Secondary | ICD-10-CM

## 2018-03-10 DIAGNOSIS — R7301 Impaired fasting glucose: Secondary | ICD-10-CM

## 2018-03-10 LAB — HEPATITIS C ANTIBODY
HEP C AB: NONREACTIVE
SIGNAL TO CUT-OFF: 0.01 (ref <1.00)

## 2018-03-10 NOTE — Telephone Encounter (Signed)
I have ordered some bloodwork for her. Make sure she tells lab she is getting hep C and additional bloodwork when she goes since orders placed on different days.   I will call with results when I see them.   She does not need to fast.

## 2018-03-10 NOTE — Telephone Encounter (Signed)
Pt. Came into office 2/12 to have Hep C drawn. Pt. Wanted to know status of MRI order. Routed to PCP and referral coordinator to advise.

## 2018-03-11 ENCOUNTER — Telehealth: Payer: Self-pay

## 2018-03-11 NOTE — Telephone Encounter (Signed)
Sorry Raquel Sarna - didn't realize there were two messages. Looks like this was addressed already. She should be called regarding scheduling in next couple of weeks. Have her leg [place] know if not (but I believe you also gave number).

## 2018-03-11 NOTE — Telephone Encounter (Signed)
Noted  

## 2018-03-11 NOTE — Telephone Encounter (Signed)
Author phoned pt. to notify about pending labs ordered by PCP 2/13, as well as relay Hep C result and confirm that the MRI had been authorized 2/7. Pt. verbalized understanding, and stated [facility] had not contacted her yet. Phone number to [facility] provided to patient for follow-up if she does not hear from them soon. Pt. did not feel very interested in getting labs drawn at this time, as her bone pain has mostly vanished she said since she recently saw Dr. Peri Maris PA, kristi edmisten, who put her on meloxicam. Pt. has discontinued her ibuprofen. Med list updated and PCP made aware.

## 2018-03-11 NOTE — Telephone Encounter (Signed)
That's great. Happy to hear she is no longer having the pain! Not sure how I can cancel labs but since she is being monitored by research study group there is really no need for her to complete bloodwork unless a new issue arises.

## 2018-03-25 ENCOUNTER — Encounter: Payer: Self-pay | Admitting: Family Medicine

## 2018-03-29 ENCOUNTER — Telehealth: Payer: Self-pay | Admitting: Family Medicine

## 2018-03-29 DIAGNOSIS — I10 Essential (primary) hypertension: Secondary | ICD-10-CM

## 2018-03-29 NOTE — Telephone Encounter (Signed)
Copied from [place] [id number]. Topic: Quick Communication - Rx Refill/Question >> Mar 29, 2018  3:25 PM Margot Ables wrote: Medication: pt new to Dr. Ethlyn Gallery - needing new RXs sent in - all medications for 90 day supply as she has no refills  metoprolol succinate (TOPROL-XL) 50 MG 24 hr tablet - 1 wk left clopidogrel (PLAVIX) 75 MG tablet  - 1 wk left buPROPion (WELLBUTRIN XL) 300 MG 24 hr tablet - 1 1/2 wk left lisinopril (PRINIVIL,ZESTRIL) 10 MG tablet - 2 pills left  Has the patient contacted their pharmacy? No - new to Dr. Ethlyn Gallery Preferred Pharmacy (with phone number or street name): [place], [place] - [address] [phone] (Phone) [phone] (Fax)

## 2018-03-29 NOTE — Telephone Encounter (Signed)
Please advise. These medications have not been filled by you.

## 2018-03-30 MED ORDER — BUPROPION HCL ER (XL) 300 MG PO TB24: 300.0000 mg | ORAL_TABLET | Freq: Every day | ORAL | 1 refills | Status: DC

## 2018-03-30 MED ORDER — METOPROLOL SUCCINATE ER 50 MG PO TB24: ORAL_TABLET | ORAL | 1 refills | Status: DC

## 2018-03-30 MED ORDER — LISINOPRIL 10 MG PO TABS: 10.0000 mg | ORAL_TABLET | Freq: Every day | ORAL | 1 refills | Status: DC

## 2018-03-30 MED ORDER — CLOPIDOGREL BISULFATE 75 MG PO TABS: 75.0000 mg | ORAL_TABLET | Freq: Every day | ORAL | 1 refills | Status: DC

## 2018-03-30 NOTE — Telephone Encounter (Signed)
Should be once daily; not sure why all the other jargon in there (&nbsp) which can be removed.

## 2018-03-30 NOTE — Telephone Encounter (Signed)
Called pharmacy and gave correct directions. Nothing further needed.

## 2018-03-30 NOTE — Telephone Encounter (Signed)
Pharmacy calling to get clarification on metoprolol succinate (TOPROL-XL) 50 MG 24 hr tablet instructions.

## 2018-03-30 NOTE — Telephone Encounter (Signed)
Prescriptions have been sent in.  °

## 2018-03-30 NOTE — Telephone Encounter (Signed)
Please advise. Current Rx says " TAKE ONE TABLET BY MOUTH&nbsp;&nbsp;DAILY. TAKE WITH OR IMMEDIATELY FOLLOWING A MEAL"

## 2018-03-30 NOTE — Telephone Encounter (Signed)
Ok for all 

## 2018-03-31 ENCOUNTER — Telehealth: Payer: Self-pay

## 2018-03-31 NOTE — Telephone Encounter (Signed)
Pt. left VM on author's phone 3/3 asking for return call. Returned call and pt. stated she had her medications straightened out already by pharmacist on 3/4. No other concerns at this time.

## 2018-05-20 ENCOUNTER — Telehealth: Payer: Self-pay | Admitting: Family Medicine

## 2018-05-20 NOTE — Telephone Encounter (Signed)
Copied from [place] [id number]. Topic: Quick Communication - Rx Refill/Question >> May 20, 2018  4:19 PM Rayann Heman wrote: Medication:omeprazole ([place]) 40 MG capsule [[id number]]  Has the patient contacted their pharmacy? no Preferred Pharmacy (with phone number or street name): [place], [place] - [address] [phone] (Phone) [phone] (Fax)    Agent: Please be advised that RX refills may take up to 3 business days. We ask that you follow-up with your pharmacy.

## 2018-05-23 MED ORDER — OMEPRAZOLE 40 MG PO CPDR: 40.0000 mg | DELAYED_RELEASE_CAPSULE | Freq: Every day | ORAL | 0 refills | Status: DC

## 2018-05-23 NOTE — Telephone Encounter (Signed)
ok 

## 2018-05-23 NOTE — Telephone Encounter (Signed)
Rx has been sent in. 

## 2018-05-23 NOTE — Telephone Encounter (Signed)
Last filled 01/07/2015 by Dr. Stanford Breed.  Ok to fill?

## 2018-06-14 ENCOUNTER — Telehealth: Payer: Self-pay | Admitting: Cardiology

## 2018-06-15 NOTE — Progress Notes (Signed)
Virtual Visit via Telephone Note   This visit type was conducted due to national recommendations for restrictions regarding the COVID-19 Pandemic (e.g. social distancing) in an effort to limit this patient's exposure and mitigate transmission in our community.  Due to her co-morbid illnesses, this patient is at least at moderate risk for complications without adequate follow up.  This format is felt to be most appropriate for this patient at this time.  The patient did not have access to video technology/had technical difficulties with video requiring transitioning to audio format only (telephone).  All issues noted in this document were discussed and addressed.  No physical exam could be performed with this format.  Please refer to the patient's chart for her  consent to telehealth for [facility].   Date:  06/16/2018   ID:  [name] [name], DOB 03/24/1945, MRN [id number]  Patient Location: Home Provider Location: Home  PCP:  Caren Macadam, MD  Cardiologist:  Dr Stanford Breed  Evaluation Performed:  Follow-Up Visit  Chief Complaint: FU cerebrovascular disease  History of Present Illness:    FU cerebrovascular disease. 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 She does not have CAD and had a normal nuclear study 06/17/2006. 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 Echocardiogram July 2017 showed normal LV function, mild aortic and mitral regurgitation.  Abdominal ultrasound August 2017 showed greater than 50% bilateral common iliac arteries.  No aneurysm.  Carotid Dopplers February 2020 showed 1 to 39% right and 40 to 59% left stenosis.  Since last seen, the patient denies any dyspnea on exertion, orthopnea, PND, pedal edema, palpitations, syncope or chest pain.   The patient does not have symptoms concerning for COVID-19 infection (fever, chills,  cough, or new shortness of breath).    Past Medical History:  Diagnosis Date   Anxiety    Aorto-iliac atherosclerosis ([place])    last aaa duplex 08-30-2015  >50% stenosis bilateral common iliac arteries and left external iliac artery   Carotid stenosis, bilateral    cardiologist-- dr Stanford Breed ([place] 08-06-2015)  04-29-2017 followed by pcp/  last duplex 03-08-2017 bilateral ICA 40-59% post endarterectomy,  right ECA occluded   Cerebral vascular disease    Chronic gastritis    Full dentures    GERD (gastroesophageal reflux disease)    Hiatal hernia    History of aortic valve disorder    per cardiology note dated 08-06-2015 by dr Stanford Breed pt has hx small fibroblastoma aortic valve per echo 2008 /  last echo 08-23-2015 no fibroblastoma noted or stenosis or thickened/ calcification   History of gastric ulcer 2013   History of nuclear stress test 06-17-2006   per dr Stanford Breed note dated 08-06-2015 , normal study   History of transient ischemic attack (TIA) 04/25/2006   severe left ICA stenosis/   04-29-2017 per pt no residual   HLD (hyperlipidemia)    HTN (hypertension)    IBS (irritable bowel syndrome)    OA (osteoarthritis)    Prolapsed internal hemorrhoids, grade 3    PVD (peripheral vascular disease) ([place])    Rectal bleed    SYMPTOM, ECCHYMOSES, SPONTANEOUS 06/16/2006   Qualifier: Diagnosis of  By: Leanne Chang MD, Bruce     Wears contact lenses    Past Surgical History:  Procedure Laterality Date   CAROTID ENDARTERECTOMY Bilateral left 04-29-2006 and right 08-17-2007 by  dr c. Scot Dock  [facility]  HEMORRHOID SURGERY N/A 05/07/2017   Procedure: ANAL EXAM UNDER ANESTHESIA, HEMORRHOIDECTOMY;  Surgeon: Leighton Ruff, MD;  Location: [place];  Service: General;  Laterality: N/A;   NASAL SINUS SURGERY  age 76   TONSILLECTOMY  age 11   TRANSTHORACIC ECHOCARDIOGRAM  08/23/2015   ef 19-37%, grade 1 diastolic dysfunction/ mild AR and MR   VASCULAR SURGERY  Bilateral    carotids   WRIST GANGLION EXCISION Right yrs ago     Current Meds  Medication Sig   AMBULATORY NON FORMULARY MEDICATION Inject 300 mg into the skin as directed. Medication Name: Inclisiran sodium vs placebo (Patient taking differently: Inject into the skin as directed. Per pt in [place]  cardiology research trial)   aspirin EC 81 MG tablet Take 1 tablet (81 mg total) by mouth daily.   buPROPion (WELLBUTRIN XL) 300 MG 24 hr tablet Take 1 tablet (300 mg total) by mouth daily. PATIENT NEEDS TO CONTACT OFFICE FOR ADDITIONAL REFILLS   clopidogrel (PLAVIX) 75 MG tablet Take 1 tablet (75 mg total) by mouth daily.   lisinopril (PRINIVIL,ZESTRIL) 10 MG tablet Take 1 tablet (10 mg total) by mouth daily.   meloxicam (MOBIC) 15 MG tablet    metoprolol succinate (TOPROL-XL) 50 MG 24 hr tablet TAKE ONE TABLET BY MOUTH  DAILY. TAKE WITH OR IMMEDIATELY FOLLOWING A MEAL   omeprazole (PRILOSEC) 40 MG capsule Take 1 capsule (40 mg total) by mouth daily.   rosuvastatin (CRESTOR) 20 MG tablet Take 20 mg by mouth every other day.    traZODone (DESYREL) 50 MG tablet TAKE ONE-HALF TO ONE TABLET BY MOUTH AT BEDTIME AS NEEDED     Allergies:   Sulfa antibiotics   Social History   Tobacco Use   Smoking status: Former Smoker    Years: 30.00    Types: Cigarettes    Last attempt to quit: 04/02/2012    Years since quitting: 6.2   Smokeless tobacco: Never Used  Substance Use Topics   Alcohol use: No   Drug use: No     Family Hx: The patient's family history includes Asthma in her mother; Breast cancer in an other family member; Diabetes in her mother and another family member; Heart disease in her brother; Stroke in her father. There is no history of Colon cancer.  ROS:   Please see the history of present illness.    No fevers, chills or productive cough. Complains of some knee pain All other systems reviewed and are negative.  Recent Lipid Panel Lab Results  Component  Value Date/Time   CHOL 197 03/12/2016   TRIG 309 (A) 03/12/2016   HDL 50.30 12/20/2012 04:48 PM   CHOLHDL 4 12/20/2012 04:48 PM   LDLCALC 87 07/03/2011 08:47 AM   LDLDIRECT 112.0 12/20/2012 04:48 PM    Wt Readings from Last 3 Encounters:  06/16/18 143 lb (64.9 kg)  03/02/18 145 lb (65.8 kg)  03/02/18 145 lb 6.4 oz (66 kg)     Objective:    Vital Signs:  BP 140/62 (BP Location: Right Arm)    Pulse 77    Ht 5\' 2"  (1.575 m)    Wt 143 lb (64.9 kg)    BMI 26.16 kg/m    VITAL SIGNS:  reviewed  No acute distress Answers questions appropriately Normal affect Remainder of physical examination not performed (telehealth visit; coronavirus pandemic)  ASSESSMENT & PLAN:    1. Carotid artery disease-continue aspirin and statin.  Follow-up carotid Dopplers February 2021. 2. Hypertension-patient's  blood pressure is controlled.  Continue present medications and follow. 3. Hyperlipidemia-continue statin.  Note she did not tolerate high doses in the past.  She will continue in [place] study. 4. 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 5. Peripheral vascular disease-patient denies claudication.  Continue medical therapy with aspirin and statin.  COVID-19 Education: The importance of social distancing was discussed today.  Time:   Today, I have spent 12 minutes with the patient with telehealth technology discussing the above problems.     Medication Adjustments/Labs and Tests Ordered: Current medicines are reviewed at length with the patient today.  Concerns regarding medicines are outlined above.   Tests Ordered: No orders of the defined types were placed in this encounter.   Medication Changes: No orders of the defined types were placed in this encounter.   Disposition:  Follow up in 1 year(s)  Signed, Kirk Ruths, MD  06/16/2018 12:54 PM    [place]

## 2018-06-16 ENCOUNTER — Encounter: Payer: Self-pay | Admitting: Cardiology

## 2018-06-16 ENCOUNTER — Telehealth (INDEPENDENT_AMBULATORY_CARE_PROVIDER_SITE_OTHER): Payer: Medicare Other | Admitting: Cardiology

## 2018-06-16 VITALS — BP 140/62 | HR 77 | Ht 62.0 in | Wt 143.0 lb

## 2018-06-16 DIAGNOSIS — I10 Essential (primary) hypertension: Secondary | ICD-10-CM

## 2018-06-16 DIAGNOSIS — E7800 Pure hypercholesterolemia, unspecified: Secondary | ICD-10-CM

## 2018-06-16 DIAGNOSIS — I359 Nonrheumatic aortic valve disorder, unspecified: Secondary | ICD-10-CM | POA: Diagnosis not present

## 2018-06-16 DIAGNOSIS — I679 Cerebrovascular disease, unspecified: Secondary | ICD-10-CM

## 2018-06-16 NOTE — Patient Instructions (Addendum)
Medication Instructions:  NO CHANGE If you need a refill on your cardiac medications before your next appointment, please call your pharmacy.   Lab work: If you have labs (blood work) drawn today and your tests are completely normal, you will receive your results only by: . MyChart Message (if you have MyChart) OR . A paper copy in the mail If you have any lab test that is abnormal or we need to change your treatment, we will call you to review the results.  Testing/Procedures: Your physician has requested that you have an echocardiogram. Echocardiography is a painless test that uses sound waves to create images of your heart. It provides your doctor with information about the size and shape of your heart and how well your heart's chambers and valves are working. This procedure takes approximately one hour. There are no restrictions for this procedure.  [address]  Follow-Up: At [facility], you and your health needs are our priority.  As part of our continuing mission to provide you with exceptional heart care, we have created designated Provider Care Teams.  These Care Teams include your primary Cardiologist (physician) and Advanced Practice Providers (APPs -  Physician Assistants and Nurse Practitioners) who all work together to provide you with the care you need, when you need it. You will need a follow up appointment in 12 months.  Please call our office 2 months in advance to schedule this appointment.  You may see BRIAN CRENSHAW MD or one of the following Advanced Practice Providers on your designated Care Team:   Luke Kilroy, PA-C Krista Kroeger, PA-C . Callie Goodrich, PA-C     

## 2018-07-13 ENCOUNTER — Other Ambulatory Visit: Payer: Self-pay

## 2018-07-13 ENCOUNTER — Other Ambulatory Visit: Payer: Self-pay | Admitting: Family Medicine

## 2018-07-13 ENCOUNTER — Ambulatory Visit (INDEPENDENT_AMBULATORY_CARE_PROVIDER_SITE_OTHER): Payer: Medicare Other | Admitting: Family Medicine

## 2018-07-13 DIAGNOSIS — R109 Unspecified abdominal pain: Secondary | ICD-10-CM

## 2018-07-13 DIAGNOSIS — I10 Essential (primary) hypertension: Secondary | ICD-10-CM

## 2018-07-13 DIAGNOSIS — K921 Melena: Secondary | ICD-10-CM | POA: Diagnosis not present

## 2018-07-13 DIAGNOSIS — K219 Gastro-esophageal reflux disease without esophagitis: Secondary | ICD-10-CM | POA: Diagnosis not present

## 2018-07-13 MED ORDER — PANTOPRAZOLE SODIUM 40 MG PO TBEC: 40.0000 mg | DELAYED_RELEASE_TABLET | Freq: Every day | ORAL | 1 refills | Status: DC

## 2018-07-13 MED ORDER — SUCRALFATE 1 G PO TABS: 1.0000 g | ORAL_TABLET | Freq: Three times a day (TID) | ORAL | 0 refills | Status: DC

## 2018-07-13 NOTE — Progress Notes (Signed)
Virtual Visit via Telephone Note  I connected with [name] [name]  on 07/13/18 at  2:00 PM EDT by telephone and verified that I am speaking with the correct person using two identifiers.   I discussed the limitations, risks, security and privacy concerns of performing an evaluation and management service by telephone and the availability of in person appointments. I also discussed with the patient that there may be a patient responsible charge related to this service. The patient expressed understanding and agreed to proceed.  Location patient: home Location provider: work office Participants present for the call: patient, provider Patient did not have a visit in the prior 7 days to address this/these issue(s).   History of Present Illness: About 37 years ago had major problems that she feels are similar to what she is going through now. Had a couple of endoscopies at that point and had hiatal hernia, acid reflux, 6 bleeding ulcers, and had to have esophagus stretched x 2. Currently she has had about 4 nights of "walking the floor" all night long. Has noted sx started bout 3-4 weeks ago. Everything she eats stops where rib cage comes together. Nothing comes up and nothing goes down Not sure if air or acid reflux, which has been more of problem for her lately. Was taking omeprazole but also taking a few other medications (nexium,pepcid). Got to point of thinking if she doesn't eat then it won't hurt. Bed is inclined.   Pain in bottom of stomach (which she sates is in area of where she would previously have period type pain) is significant. Pain feels like it is all the way across abdomen and then feels like things are lodged up above this area. With walking around pain eventually goes away in stomach.   Little bit of blood in stool. Stool is lighter in brown and coming in smaller amounts. No vomiting. Hurts sometimes to drink liquids, but not getting stuck. Food doesn't stick but feels like huge knot.  Felt like things would come back up in throat when she laid down.   Pain more at night but not during day. Thinks being so active during the day helps and that activity tends to help with stomach discomfort. Not getting heart burn, but burping more and feels like she is "pumped with gas". No trouble with breathing. No chest pain/pressure. Does have some pain in upper belly as well.   Right now eating mostly cereal, applesauce, jello. Trying to eat more bland foods.    Observations/Objective: Patient sounds cheerful and well on the phone. I do not appreciate any SOB. Speech and thought processing are grossly intact. Patient reported vitals:  Assessment and Plan: 1. Gastroesophageal reflux disease, esophagitis presence not specified I feel she needs specialty follow up. If unable to get in with them in next few weeks would like for her to follow up with me for in office exam. - Ambulatory referral to Gastroenterology - pantoprazole (PROTONIX) 40 MG tablet; Take 1 tablet (40 mg total) by mouth daily.  Dispense: 90 tablet; Refill: 1  2. Abdominal pain, unspecified abdominal location Switch from omeprazole to protonix due to plavix interaction. Add pepcid at bedtime nightly. Trial carafate with meals. - Ambulatory referral to Gastroenterology - sucralfate (CARAFATE) 1 g tablet; Take 1 tablet (1 g total) by mouth 4 (four) times daily -  with meals and at bedtime.  Dispense: 120 tablet; Refill: 0  3. Blood in stool Minimal but will need to follow with GI. - Ambulatory  referral to Gastroenterology   Follow Up Instructions:  Return if symptoms worsen or fail to improve.  I did not refer this patient for an OV in the next 24 hours for this/these issue(s).  I discussed the assessment and treatment plan with the patient. The patient was provided an opportunity to ask questions and all were answered. The patient agreed with the plan and demonstrated an understanding of the instructions.   The  patient was advised to call back or seek an in-person evaluation if the symptoms worsen or if the condition fails to improve as anticipated.  I provided 30 minutes of non-face-to-face time during this encounter.   Micheline Rough, MD

## 2018-07-20 ENCOUNTER — Telehealth: Payer: Self-pay

## 2018-07-20 ENCOUNTER — Ambulatory Visit: Payer: Medicare Other | Admitting: Nurse Practitioner

## 2018-07-20 ENCOUNTER — Other Ambulatory Visit (INDEPENDENT_AMBULATORY_CARE_PROVIDER_SITE_OTHER): Payer: Medicare Other

## 2018-07-20 ENCOUNTER — Encounter: Payer: Self-pay | Admitting: Nurse Practitioner

## 2018-07-20 VITALS — BP 142/66 | HR 69 | Temp 98.5°F | Ht 61.0 in | Wt 144.5 lb

## 2018-07-20 DIAGNOSIS — R1013 Epigastric pain: Secondary | ICD-10-CM | POA: Diagnosis not present

## 2018-07-20 DIAGNOSIS — Z8711 Personal history of peptic ulcer disease: Secondary | ICD-10-CM

## 2018-07-20 DIAGNOSIS — K219 Gastro-esophageal reflux disease without esophagitis: Secondary | ICD-10-CM

## 2018-07-20 LAB — COMPREHENSIVE METABOLIC PANEL
ALT: 19 U/L (ref 0–35)
AST: 20 U/L (ref 0–37)
Albumin: 4.5 g/dL (ref 3.5–5.2)
Alkaline Phosphatase: 89 U/L (ref 39–117)
BUN: 13 mg/dL (ref 6–23)
CO2: 30 mEq/L (ref 19–32)
Calcium: 9.8 mg/dL (ref 8.4–10.5)
Chloride: 101 mEq/L (ref 96–112)
Creatinine, Ser: 0.93 mg/dL (ref 0.40–1.20)
GFR: 58.88 mL/min — ABNORMAL LOW (ref >60.00)
Glucose, Bld: 88 mg/dL (ref 70–99)
Potassium: 5 mEq/L (ref 3.5–5.1)
Sodium: 137 mEq/L (ref 135–145)
Total Bilirubin: 0.3 mg/dL (ref 0.2–1.2)
Total Protein: 7.3 g/dL (ref 6.0–8.3)

## 2018-07-20 LAB — LIPASE: Lipase: 17 U/L (ref 11.0–59.0)

## 2018-07-20 MED ORDER — PANTOPRAZOLE SODIUM 40 MG PO TBEC: 40.0000 mg | DELAYED_RELEASE_TABLET | Freq: Two times a day (BID) | ORAL | 1 refills | Status: DC

## 2018-07-20 NOTE — Progress Notes (Signed)
ASSESSMENT / PLAN:    75.  73 year old female with epigastric discomfort/early satiety and mild weight loss over the last couple of months.  She has a history of PUD secondary to NSAIDs.  She has been taking NSAIDs again in the form of ibuprofen and meloxicam.  Rule out recurrent PUD.  -We will obtain labs today including a CBC, liver test and lipase -Scheduled for EGD, unless above lab results suggest we take a different path. The risks and benefits of EGD were discussed and the patient agrees to proceed.  -For now we will increase her PPI to twice daily -I have asked her to refrain from using ibuprofen, Mobic or any other anti-inflammatories until EGD is done and we know more.  -Patient takes Plavix. See #3   2. Colon cancer screening.  She had a complete colonoscopy with a good prep in 2013 for evaluation of abdominal pain.  Internal hemorrhoids found, no polyps, cancers or other findings.  3.  Chronic antiplatelet therapy, on Plavix.  I presume this is for history of TIA.  -Hold Plavix for 5 days before procedure - will instruct when and how to resume after procedure. Patient understands that there is a low but real risk of cardiovascular event such as heart attack, stroke, or embolism /  thrombosis, or ischemia while off Plavix. The patient consents to proceed. Will communicate by phone or EMR with patient's prescribing provider to confirm that holding Plavix is reasonable in this case. - If Dr. Henrene Pastor feels comfortable proceeding with EGD on Plavix then we will call patient and let her know    HPI:    Chief Complaint:   Upper abdominal discomfort  Patient is a 73 year old female with PMH of hypertension, hyperlipidemia, TIA (on plavix), PVD, GERD and PUD. previously followed by Dr. Olevia Perches history of IBS and PUD (2013) secondary to NSAIDs.   Patient comes in today for evaluation of upper abdominal discomfort and nausea over the last 2 months.  She describes food as "  stopping" at the top of her stomach when she eats leading to early satiety.   No dysphagia.  She feels uncomfortable after eating, walks around to try and alleviate the fullness.  She has recently lost 4 pounds.  Surviving off of a bland/soft diet right now.  Meloxicam on home med list.  Patient has been taking it for about a month but prior to that she was taking ibuprofen.  She is on chronic PPI therapy.  PCP started her on Carafate a week ago but she is having once tolerating the pills.  She does not do well with liquid medications either.  Patient has not had any blood in her stools/melena.  In addition to above she complains of a fairly recent onset of lower abdominal discomfort but this mainly occurs when she is walking.  The discomfort is not related to eating or bowel movements in any way.  She has no urinary symptoms.  Her bowel movements are normal   Data Reviewed:  Screening colonoscopy May 2013.  Sam was complete with a good prep with findings of with findings of only internal hemorrhoids.   Past Medical History:  Diagnosis Date  . Anxiety   . Aorto-iliac atherosclerosis ([place])    last aaa duplex 08-30-2015  >50% stenosis bilateral common iliac arteries and left external iliac artery  . Arrhythmia   . Carotid stenosis, bilateral    cardiologist-- dr Stanford Breed ([place] 08-06-2015)  04-29-2017 followed by pcp/  last duplex 03-08-2017 bilateral ICA 40-59% post endarterectomy,  right ECA occluded  . Cerebral vascular disease   . Chronic gastritis   . Elevated cholesterol   . Esophageal stricture   . Full dentures   . GERD (gastroesophageal reflux disease)   . Hiatal hernia   . History of aortic valve disorder    per cardiology note dated 08-06-2015 by dr Stanford Breed pt has hx small fibroblastoma aortic valve per echo 2008 /  last echo 08-23-2015 no fibroblastoma noted or stenosis or thickened/ calcification  . History of gastric ulcer 2013  . History of nuclear stress test 06-17-2006   per  dr Stanford Breed note dated 08-06-2015 , normal study  . History of transient ischemic attack (TIA) 04/25/2006   severe left ICA stenosis/   04-29-2017 per pt no residual  . HLD (hyperlipidemia)   . HTN (hypertension)   . Hypertension   . IBS (irritable bowel syndrome)   . OA (osteoarthritis)   . Pneumonia   . Prolapsed internal hemorrhoids, grade 3   . PVD (peripheral vascular disease) ([place])   . Rectal bleed   . SYMPTOM, ECCHYMOSES, SPONTANEOUS 06/16/2006   Qualifier: Diagnosis of  By: Leanne Chang MD, Bruce    . Wears contact lenses      Past Surgical History:  Procedure Laterality Date  . CAROTID ENDARTERECTOMY Bilateral left 04-29-2006 and right 08-17-2007 by  dr c. Scot Dock  [facility]  . HEMORRHOID SURGERY N/A 05/07/2017   Procedure: ANAL EXAM UNDER ANESTHESIA, HEMORRHOIDECTOMY;  Surgeon: Leighton Ruff, MD;  Location: [place];  Service: General;  Laterality: N/A;  . NASAL SINUS SURGERY  age 37  . TONSILLECTOMY  age 57  . TRANSTHORACIC ECHOCARDIOGRAM  08/23/2015   ef 68-34%, grade 1 diastolic dysfunction/ mild AR and MR  . VASCULAR SURGERY Bilateral    carotids  . WRIST GANGLION EXCISION Right yrs ago   Family History  Problem Relation Age of Onset  . Stroke Father   . Diabetes Mother   . Asthma Mother   . Heart disease Brother   . Diabetes Other   . Breast cancer Other   . Colon cancer Neg Hx    Social History   Tobacco Use  . Smoking status: Former Smoker    Years: 30.00    Types: Cigarettes    Quit date: 04/02/2012    Years since quitting: 6.3  . Smokeless tobacco: Never Used  Substance Use Topics  . Alcohol use: No  . Drug use: No   Current Outpatient Medications  Medication Sig Dispense Refill  . aspirin EC 81 MG tablet Take 1 tablet (81 mg total) by mouth daily. 90 tablet 3  . buPROPion (WELLBUTRIN XL) 300 MG 24 hr tablet Take 1 tablet (300 mg total) by mouth daily. PATIENT NEEDS TO CONTACT OFFICE FOR ADDITIONAL REFILLS (Patient taking differently:  Take 300 mg by mouth daily as needed. PATIENT NEEDS TO CONTACT OFFICE FOR ADDITIONAL REFILLS) 90 tablet 1  . clopidogrel (PLAVIX) 75 MG tablet Take 1 tablet (75 mg total) by mouth daily. 90 tablet 1  . lisinopril (PRINIVIL,ZESTRIL) 10 MG tablet Take 1 tablet (10 mg total) by mouth daily. 90 tablet 1  . meloxicam (MOBIC) 15 MG tablet Take 15 mg by mouth daily.     . metoprolol succinate (TOPROL-XL) 50 MG 24 hr tablet TAKE ONE TABLET BY MOUTH&nbsp;&nbsp;DAILY. TAKE WITH OR IMMEDIATELY FOLLOWING A MEAL 90 tablet 1  . pantoprazole (PROTONIX) 40 MG tablet Take  1 tablet (40 mg total) by mouth daily. 90 tablet 1  . sucralfate (CARAFATE) 1 g tablet Take 1 tablet (1 g total) by mouth 4 (four) times daily -  with meals and at bedtime. 120 tablet 0  . traZODone (DESYREL) 50 MG tablet TAKE ONE-HALF TO ONE TABLET BY MOUTH AT BEDTIME AS NEEDED 90 tablet 0  . vitamin B-12 (CYANOCOBALAMIN) 1000 MCG tablet Take 1,000 mcg by mouth daily.     No current facility-administered medications for this visit.    Allergies  Allergen Reactions  . Sulfa Antibiotics Swelling     Review of Systems: Positive for arthritis, back pain, fatigue and sleeping problems.  All other systems reviewed and negative except where noted in HPI.      Physical Exam:    Wt Readings from Last 3 Encounters:  07/20/18 144 lb 8 oz (65.5 kg)  06/16/18 143 lb (64.9 kg)  03/02/18 145 lb (65.8 kg)    BP (!) 142/66   Pulse 69   Temp 98.5 F (36.9 C)   Ht 5\' 1"  (1.549 m)   Wt 144 lb 8 oz (65.5 kg)   BMI 27.30 kg/m  Constitutional:  Pleasant female in no acute distress. Psychiatric: Normal mood and affect. Behavior is normal. EENT: Pupils normal.  Conjunctivae are normal. No scleral icterus. Neck supple.  Cardiovascular: Normal rate, regular rhythm. No edema Pulmonary/chest: Effort normal and breath sounds normal. No wheezing, rales or rhonchi. Abdominal: Soft, nondistended, nontender. Bowel sounds active throughout. There are no  masses palpable. No hepatomegaly. Neurological: Alert and oriented to person place and time. Skin: Skin is warm and dry. No rashes noted.  Tye Savoy, NP  07/20/2018, 2:50 PM  Cc: Caren Macadam, MD

## 2018-07-20 NOTE — Telephone Encounter (Signed)
[facility] Pre-operative Risk Assessment     Request for surgical clearance:     Endoscopy Procedure  What type of surgery is being performed?     EGD  When is this surgery scheduled?     08/06/18  What type of clearance is required ?   Pharmacy  Are there any medications that need to be held prior to surgery and how long? HOLD PLAVIX FOR FIVE DAYS PRIOR  Practice name and name of physician performing surgery?      [facility]/Dr. Henrene Pastor  What is your office phone and fax number?      Phone- [phone]  Fax- [phone] Attn: Peter Congo, RMA  Anesthesia type (None, local, MAC, general) ?       MAC

## 2018-07-20 NOTE — Patient Instructions (Signed)
If you are age 74 or older, your body mass index should be between 23-30. Your Body mass index is 27.3 kg/m. If this is out of the aforementioned range listed, please consider follow up with your Primary Care Provider.  If you are age 61 or younger, your body mass index should be between 19-25. Your Body mass index is 27.3 kg/m. If this is out of the aformentioned range listed, please consider follow up with your Primary Care Provider.   You have been scheduled for an endoscopy. Please follow written instructions given to you at your visit today. If you use inhalers (even only as needed), please bring them with you on the day of your procedure. Your physician has requested that you go to www.startemmi.com and enter the access code given to you at your visit today. This web site gives a general overview about your procedure. However, you should still follow specific instructions given to you by our office regarding your preparation for the procedure.  We have sent the following medications to your pharmacy for you to pick up at your convenience: Protonix - take twice daily for now.  HOLD Meloxicam for now.  You will be contacted by our office prior to your procedure for directions on holding your Plavix.  If you do not hear from our office 1 week prior to your scheduled procedure, please call [phone] to discuss.   Thank you for choosing me and [facility].   Tye Savoy, NP

## 2018-07-20 NOTE — Telephone Encounter (Signed)
Patient does not have h/o CAD, she is on aspirin and plavix for carotid disease. Plavix has been prescribed by PCP. Review of record shows she has been on DAPT since at least 2012. I recommend check with her primary care provider. Recent carotid doppler obtained on 03/11/2018 showed moderate disease in LICA and mild disease in RICA. She likely can come off of plavix for 5 days prior to surgery without any issue. Will defer final clearance to Dr. Ethlyn Gallery.

## 2018-07-20 NOTE — Telephone Encounter (Signed)
Covid-19 screening questions   Do you now or have you had a fever in the last 14 days? No  Do you have any respiratory symptoms of shortness of breath or cough now or in the last 14 days? No  Do you have any family members or close contacts with diagnosed or suspected Covid-19 in the past 14 days? No  Have you been tested for Covid-19 and found to be positive? No        

## 2018-07-21 NOTE — Progress Notes (Signed)
Agree with excellent assessment and plan.  Agree with PERFORMING EGD ON PLAVIX.

## 2018-07-22 ENCOUNTER — Telehealth: Payer: Self-pay | Admitting: Family Medicine

## 2018-07-22 MED ORDER — TRAZODONE HCL 50 MG PO TABS: ORAL_TABLET | ORAL | 0 refills | Status: DC

## 2018-07-22 NOTE — Telephone Encounter (Signed)
Rx done. 

## 2018-07-22 NOTE — Telephone Encounter (Signed)
Ok to fill for 30 days  

## 2018-07-22 NOTE — Telephone Encounter (Signed)
Pt is requesting a refill on traZODone (DESYREL) 50 MG tablet    Pharmacy:  [facility] [address] ([address]), [place] - [address] [phone] (Phone) [phone] (Fax)

## 2018-07-25 ENCOUNTER — Telehealth: Payer: Self-pay

## 2018-07-25 NOTE — Telephone Encounter (Signed)
Patient informed to hold her Plavix and she verbalized understanding.

## 2018-07-25 NOTE — Telephone Encounter (Signed)
I called [facility] and informed Patti Martinique of the message below as she stated Peter Congo is out of the office today.

## 2018-07-25 NOTE — Telephone Encounter (Signed)
Left patient message to call me back in regards to holding her Plavix for 5 days prior to her procedure on 08/06/2018, she should take her last dose on 07/31/2018. We received the okay to do this today from her PCP. See telephone call from 07/20/2018.

## 2018-07-25 NOTE — Telephone Encounter (Signed)
Ok to hold plavix 5 days prior to surgery.

## 2018-07-26 ENCOUNTER — Encounter: Payer: Medicare Other | Admitting: Internal Medicine

## 2018-07-26 NOTE — Telephone Encounter (Signed)
Opened in error

## 2018-07-27 ENCOUNTER — Telehealth: Payer: Self-pay

## 2018-07-27 NOTE — Telephone Encounter (Signed)
-----   Message from Willia Craze, NP sent at 07/21/2018  2:16 PM EDT ----- Peter Congo,  Dr. Henrene Pastor is okay doing her EGD ON plavix, will you let her know? Thanks

## 2018-07-27 NOTE — Telephone Encounter (Signed)
Spoke with patient today regarding continuing her Plavix for EGD.  Patient was advised NOT to stop Plavix per Nevin Bloodgood per Dr. Henrene Pastor.  Patient verbalized understanding.

## 2018-08-03 ENCOUNTER — Encounter: Payer: Medicare Other | Admitting: *Deleted

## 2018-08-03 ENCOUNTER — Other Ambulatory Visit: Payer: Self-pay

## 2018-08-03 VITALS — BP 153/49 | HR 81 | Temp 97.6°F | Resp 18 | Wt 145.2 lb

## 2018-08-03 DIAGNOSIS — Z006 Encounter for examination for normal comparison and control in clinical research program: Secondary | ICD-10-CM

## 2018-08-05 ENCOUNTER — Telehealth: Payer: Self-pay | Admitting: Internal Medicine

## 2018-08-05 NOTE — Telephone Encounter (Signed)

## 2018-08-05 NOTE — Telephone Encounter (Signed)
Patient call and answer no to all questions

## 2018-08-06 ENCOUNTER — Encounter: Payer: Self-pay | Admitting: Internal Medicine

## 2018-08-06 ENCOUNTER — Other Ambulatory Visit: Payer: Self-pay

## 2018-08-06 ENCOUNTER — Ambulatory Visit (AMBULATORY_SURGERY_CENTER): Payer: Medicare Other | Admitting: Internal Medicine

## 2018-08-06 VITALS — BP 123/52 | HR 67 | Temp 98.8°F | Resp 13 | Ht 61.0 in | Wt 144.0 lb

## 2018-08-06 DIAGNOSIS — R1013 Epigastric pain: Secondary | ICD-10-CM | POA: Diagnosis not present

## 2018-08-06 MED ORDER — SODIUM CHLORIDE 0.9 % IV SOLN: 500.0000 mL | Freq: Once | INTRAVENOUS | Status: DC

## 2018-08-06 NOTE — Op Note (Signed)
[facility] Patient Name: [name] [name] Procedure Date: 08/06/2018 9:11 AM MRN: [id number] Endoscopist: Docia Chuck. Henrene Pastor , MD Age: 73 Referring MD:  Date of Birth: 01-31-1945 Gender: Female Account #: [id number] Procedure:                Upper GI endoscopy Indications:              Epigastric abdominal pain Medicines:                Monitored Anesthesia Care Procedure:                Pre-Anesthesia Assessment:                           - Prior to the procedure, a History and Physical                            was performed, and patient medications and                            allergies were reviewed. The patient's tolerance of                            previous anesthesia was also reviewed. The risks                            and benefits of the procedure and the sedation                            options and risks were discussed with the patient.                            All questions were answered, and informed consent                            was obtained. Prior Anticoagulants: The patient has                            taken Plavix (clopidogrel), last dose was day of                            procedure. ASA Grade Assessment: III - A patient                            with severe systemic disease. After reviewing the                            risks and benefits, the patient was deemed in                            satisfactory condition to undergo the procedure.                           After obtaining informed consent, the endoscope was  passed under direct vision. Throughout the                            procedure, the patient's blood pressure, pulse, and                            oxygen saturations were monitored continuously. The                            Endoscope was introduced through the mouth, and                            advanced to the second part of duodenum. The upper                            GI endoscopy was  accomplished without difficulty.                            The patient tolerated the procedure well. Scope In: Scope Out: Findings:                 The esophagus was normal.                           The stomach was normal.                           The examined duodenum was normal.                           The cardia and gastric fundus were normal on                            retroflexion. Complications:            No immediate complications. Estimated Blood Loss:     Estimated blood loss: none. Impression:               - Normal esophagus.                           - Normal stomach.                           - Normal examined duodenum.                           - No specimens collected. Recommendation:           - Patient has a contact number available for                            emergencies. The signs and symptoms of potential                            delayed complications were discussed with the  patient. Return to normal activities tomorrow.                            Written discharge instructions were provided to the                            patient.                           - Resume previous diet.                           - Continue present medications, including Plavix.                           - SCHEDULE ABDOMINAL ULTRASOUND "epigastric pain" [name] [name] N. Henrene Pastor, MD 08/06/2018 9:31:35 AM This report has been signed electronically.

## 2018-08-06 NOTE — Progress Notes (Signed)
No problems noted in the recovery room. maw 

## 2018-08-06 NOTE — Progress Notes (Signed)
A/ox3, pleased with MAC, report to RN 

## 2018-08-06 NOTE — Progress Notes (Signed)
Pt's states no medical or surgical changes since previsit or office visit. 

## 2018-08-06 NOTE — Patient Instructions (Signed)
YOU HAD AN ENDOSCOPIC PROCEDURE TODAY AT [facility]:   Refer to the procedure report that was given to you for any specific questions about what was found during the examination.  If the procedure report does not answer your questions, please call your gastroenterologist to clarify.  If you requested that your care partner not be given the details of your procedure findings, then the procedure report has been included in a sealed envelope for you to review at your convenience later.  YOU SHOULD EXPECT: Some feelings of bloating in the abdomen. Passage of more gas than usual.  Walking can help get rid of the air that was put into your GI tract during the procedure and reduce the bloating. If you had a lower endoscopy (such as a colonoscopy or flexible sigmoidoscopy) you may notice spotting of blood in your stool or on the toilet paper. If you underwent a bowel prep for your procedure, you may not have a normal bowel movement for a few days.  Please Note:  You might notice some irritation and congestion in your nose or some drainage.  This is from the oxygen used during your procedure.  There is no need for concern and it should clear up in a day or so.  SYMPTOMS TO REPORT IMMEDIATELY:    Following upper endoscopy (EGD)  Vomiting of blood or coffee ground material  New chest pain or pain under the shoulder blades  Painful or persistently difficult swallowing  New shortness of breath  Fever of 100F or higher  Black, tarry-looking stools  For urgent or emergent issues, a gastroenterologist can be reached at any hour by calling [phone].   DIET:  We do recommend a small meal at first, but then you may proceed to your regular diet.  Drink plenty of fluids but you should avoid alcoholic beverages for 24 hours.  ACTIVITY:  You should plan to take it easy for the rest of today and you should NOT DRIVE or use heavy machinery until tomorrow (because of the sedation medicines used  during the test).    FOLLOW UP: Our staff will call the number listed on your records 48-72 hours following your procedure to check on you and address any questions or concerns that you may have regarding the information given to you following your procedure. If we do not reach you, we will leave a message.  We will attempt to reach you two times.  During this call, we will ask if you have developed any symptoms of COVID 19. If you develop any symptoms (ie: fever, flu-like symptoms, shortness of breath, cough etc.) before then, please call [phone].  If you test positive for Covid 19 in the 2 weeks post procedure, please call and report this information to [place].    If any biopsies were taken you will be contacted by phone or by letter within the next 1-3 weeks.  Please call us at [phone] if you have not heard about the biopsies in 3 weeks.    SIGNATURES/CONFIDENTIALITY: You and/or your care partner have signed paperwork which will be entered into your electronic medical record.  These signatures attest to the fact that that the information above on your After Visit Summary has been reviewed and is understood.  Full responsibility of the confidentiality of this discharge information lies with you and/or your care-partner.   You may resume your current medications today, including PLAVIX. The office will call you with an appointment for an Abdominal  Ultrasound for epigastric pain. Please call if any questions or concerns.

## 2018-08-08 NOTE — Research (Signed)
Subject to research clinic for visit 3 Day 270 in the [place] 8 research trial.  No cos, aes or saes to report.  Injection given and subject remained in clinic for 30 minutes post for observation.  Next visit scheduled and subject experienced no events and left for home

## 2018-08-09 ENCOUNTER — Other Ambulatory Visit: Payer: Self-pay | Admitting: Family Medicine

## 2018-08-09 ENCOUNTER — Telehealth: Payer: Self-pay

## 2018-08-09 ENCOUNTER — Other Ambulatory Visit: Payer: Self-pay

## 2018-08-09 DIAGNOSIS — R1013 Epigastric pain: Secondary | ICD-10-CM

## 2018-08-09 NOTE — Telephone Encounter (Signed)
  Follow up Call-  Call back number 08/06/2018  Post procedure Call Back phone  # [phone]  Permission to leave phone message Yes  Some recent data might be hidden     Patient questions:  Do you have a fever, pain , or abdominal swelling? No. Pain Score  0 *  Have you tolerated food without any problems? Yes.    Have you been able to return to your normal activities? Yes.    Do you have any questions about your discharge instructions: Diet   No. Medications  No. Follow up visit  No.  Do you have questions or concerns about your Care? No.  Actions: * If pain score is 4 or above: No action needed, pain <4. 1. Have you developed a fever since your procedure? no  2.   Have you had an respiratory symptoms (SOB or cough) since your procedure? no  3.   Have you tested positive for COVID 19 since your procedure no  4.   Have you had any family members/close contacts diagnosed with the COVID 19 since your procedure?  no   If yes to any of these questions please route to Joylene John, RN and Alphonsa Gin, Therapist, sports.

## 2018-08-09 NOTE — Telephone Encounter (Signed)
Pt scheduled for [place] of abd at [facility] 08/15/18@11am , pt to arrive there at 10:45am. Pt to be NPO after midnight. Pt aware.

## 2018-08-11 ENCOUNTER — Encounter (INDEPENDENT_AMBULATORY_CARE_PROVIDER_SITE_OTHER): Payer: Self-pay

## 2018-08-11 ENCOUNTER — Ambulatory Visit (HOSPITAL_COMMUNITY): Payer: Medicare Other | Attending: Internal Medicine

## 2018-08-11 ENCOUNTER — Other Ambulatory Visit: Payer: Self-pay

## 2018-08-11 DIAGNOSIS — I359 Nonrheumatic aortic valve disorder, unspecified: Secondary | ICD-10-CM | POA: Diagnosis present

## 2018-08-12 ENCOUNTER — Telehealth: Payer: Self-pay | Admitting: *Deleted

## 2018-08-12 DIAGNOSIS — R42 Dizziness and giddiness: Secondary | ICD-10-CM

## 2018-08-12 DIAGNOSIS — R002 Palpitations: Secondary | ICD-10-CM

## 2018-08-12 NOTE — Telephone Encounter (Signed)
Ok for 24 hour holter [employer]

## 2018-08-12 NOTE — Telephone Encounter (Signed)
Spoke with pt, Aware of dr crenshaw's recommendations.  Order placed 

## 2018-08-12 NOTE — Telephone Encounter (Addendum)
Spoke with pt regarding echo results, she reports having dizziness when turning around. She has checked her bp and pulse during the dizziness and they are normal. Explained it sounds like vertigo and instructed her to try claritin or zyrtec to see if it will help with the dizziness. She also reports feeling like she has to take a deep breath to raise her heart rate. Without warning, she will be prompted to take a deep breath. She will feel like her heart rate is too low and that prompts the deep breath. It will go away as fast as it comes. It can be related to the dizziness. Will find out if she can get a 48 hour monitor for any relation between dizziness and heart rhythm. Will forward for dr Stanford Breed review

## 2018-08-15 ENCOUNTER — Other Ambulatory Visit: Payer: Self-pay

## 2018-08-15 ENCOUNTER — Ambulatory Visit (HOSPITAL_COMMUNITY)
Admission: RE | Admit: 2018-08-15 | Discharge: 2018-08-15 | Disposition: A | Discharge status: Home / Self Care | Payer: Medicare Other | Source: Ambulatory Visit | Attending: Internal Medicine | Admitting: Internal Medicine

## 2018-08-15 DIAGNOSIS — R1013 Epigastric pain: Secondary | ICD-10-CM | POA: Insufficient documentation

## 2018-08-17 ENCOUNTER — Telehealth: Payer: Self-pay | Admitting: Internal Medicine

## 2018-08-17 NOTE — Telephone Encounter (Signed)
See result note.  

## 2018-08-17 NOTE — Telephone Encounter (Signed)
Pt returned your call, pls call her again.  °

## 2018-08-19 ENCOUNTER — Other Ambulatory Visit: Payer: Self-pay

## 2018-08-19 DIAGNOSIS — I10 Essential (primary) hypertension: Secondary | ICD-10-CM

## 2018-08-19 MED ORDER — LISINOPRIL 10 MG PO TABS: 10.0000 mg | ORAL_TABLET | Freq: Every day | ORAL | 1 refills | Status: DC

## 2018-08-29 ENCOUNTER — Other Ambulatory Visit: Payer: Self-pay | Admitting: Family Medicine

## 2018-09-05 ENCOUNTER — Telehealth: Payer: Self-pay | Admitting: *Deleted

## 2018-09-05 NOTE — Telephone Encounter (Signed)
3 day ZIO XT long term holter monitor to be mailed to patients home.  Instructions included in monitor kit.  Dr. Stanford Breed requested patient wear monitor at least 24 hours, (requires long term monitor in order to mail during [place] 19 precautions).

## 2018-09-08 ENCOUNTER — Encounter: Payer: Self-pay | Admitting: Family Medicine

## 2018-09-11 ENCOUNTER — Ambulatory Visit (INDEPENDENT_AMBULATORY_CARE_PROVIDER_SITE_OTHER): Payer: Medicare Other

## 2018-09-11 DIAGNOSIS — R42 Dizziness and giddiness: Secondary | ICD-10-CM | POA: Diagnosis not present

## 2018-10-15 ENCOUNTER — Other Ambulatory Visit: Payer: Self-pay | Admitting: Adult Health

## 2018-10-24 ENCOUNTER — Other Ambulatory Visit: Payer: Self-pay | Admitting: Family Medicine

## 2018-11-22 ENCOUNTER — Other Ambulatory Visit: Payer: Self-pay | Admitting: Family Medicine

## 2019-01-04 ENCOUNTER — Other Ambulatory Visit: Payer: Self-pay

## 2019-01-04 ENCOUNTER — Encounter: Payer: Medicare Other | Admitting: *Deleted

## 2019-01-04 VITALS — BP 151/67 | HR 67 | Wt 144.2 lb

## 2019-01-04 DIAGNOSIS — Z006 Encounter for examination for normal comparison and control in clinical research program: Secondary | ICD-10-CM

## 2019-01-04 NOTE — Research (Signed)
Patient to research clinic for visit 4 day 450 in the [place] research study.  No aes or saes to report.  Injection given and patient remained for 30 minutes for observation.  Pt tolerated well.  Next clinic visit scheduled.

## 2019-01-16 ENCOUNTER — Other Ambulatory Visit: Payer: Self-pay | Admitting: Family Medicine

## 2019-02-13 ENCOUNTER — Other Ambulatory Visit: Payer: Self-pay | Admitting: Family Medicine

## 2019-03-06 ENCOUNTER — Other Ambulatory Visit: Payer: Self-pay

## 2019-03-06 ENCOUNTER — Ambulatory Visit (INDEPENDENT_AMBULATORY_CARE_PROVIDER_SITE_OTHER): Payer: Medicare PPO

## 2019-03-06 VITALS — BP 154/68 | Temp 97.7°F | Ht 63.0 in | Wt 143.4 lb

## 2019-03-06 DIAGNOSIS — Z0000 Encounter for general adult medical examination without abnormal findings: Secondary | ICD-10-CM | POA: Diagnosis not present

## 2019-03-06 DIAGNOSIS — Z13820 Encounter for screening for osteoporosis: Secondary | ICD-10-CM

## 2019-03-06 DIAGNOSIS — Z780 Asymptomatic menopausal state: Secondary | ICD-10-CM

## 2019-03-06 NOTE — Patient Instructions (Addendum)
[name] [name] , Thank you for taking time to come for your Medicare Wellness Visit. I appreciate your ongoing commitment to your health goals. Please review the following plan we discussed and let me know if I can assist you in the future.   Screening recommendations/referrals: Colorectal Screening: colonoscopy completed 06/19/2011; due 06/19/2021. Mammogram: completed 03/20/2016; past due since 03/20/2017. Patient declines. Bone Density: order sent to [facility] [employer] on [address].  Vision and Dental Exams: Recommended annual ophthalmology exams for early detection of glaucoma and other disorders of the eye Recommended annual dental exams for proper oral hygiene  Diabetic Exams: Diabetic Eye Exam: N/A Diabetic Foot Exam: N/A  Vaccinations: Influenza vaccine: completed 01/26/2018. Due again in Fall of 2021. Pneumococcal vaccine: completed 01/24/2015 & 03/02/2018.  Tdap vaccine: patient reports this was done in 2011 before she retired.  Shingles vaccine: completed 03/02/2018 & 09/02/2018.   Advanced directives: Advance directives discussed with you today. Please bring a copy of your POA (Power of [place]) and/or Living Will to your next appointment.  Goals: Recommend to drink at least 6-8 8oz glasses of water per day.  Recommend to exercise for at least 150 minutes per week.  Recommend to remove any items from the home that may cause slips or trips.  Recommend to begin DASH diet (low sodium) as directed below  Next appointment: Please schedule your Annual Wellness Visit with your Nurse Health Advisor in one year.  Preventive Care 46 Years and Older, Female Preventive care refers to lifestyle choices and visits with your health care provider that can promote health and wellness. What does preventive care include?  A yearly physical exam. This is also called an annual well check.  Dental exams once or twice a year.  Routine eye exams. Ask your health care provider how often you should  have your eyes checked.  Personal lifestyle choices, including:  Daily care of your teeth and gums.  Regular physical activity.  Eating a healthy diet.  Avoiding tobacco and drug use.  Limiting alcohol use.  Practicing safe sex.  Taking low-dose aspirin every day if recommended by your health care provider.  Taking vitamin and mineral supplements as recommended by your health care provider. What happens during an annual well check? The services and screenings done by your health care provider during your annual well check will depend on your age, overall health, lifestyle risk factors, and family history of disease. Counseling  Your health care provider may ask you questions about your:  Alcohol use.  Tobacco use.  Drug use.  Emotional well-being.  Home and relationship well-being.  Sexual activity.  Eating habits.  History of falls.  Memory and ability to understand (cognition).  Work and work Statistician.  Reproductive health. Screening  You may have the following tests or measurements:  Height, weight, and BMI.  Blood pressure.  Lipid and cholesterol levels. These may be checked every 5 years, or more frequently if you are over 76 years old.  Skin check.  Lung cancer screening. You may have this screening every year starting at age 15 if you have a 30-pack-year history of smoking and currently smoke or have quit within the past 15 years.  Fecal occult blood test (FOBT) of the stool. You may have this test every year starting at age 89.  Flexible sigmoidoscopy or colonoscopy. You may have a sigmoidoscopy every 5 years or a colonoscopy every 10 years starting at age 20.  Hepatitis C blood test.  Hepatitis B blood test.  Sexually  transmitted disease (STD) testing.  Diabetes screening. This is done by checking your blood sugar (glucose) after you have not eaten for a while (fasting). You may have this done every 1-3 years.  Bone density scan. This  is done to screen for osteoporosis. You may have this done starting at age 64.  Mammogram. This may be done every 1-2 years. Talk to your health care provider about how often you should have regular mammograms. Talk with your health care provider about your test results, treatment options, and if necessary, the need for more tests. Vaccines  Your health care provider may recommend certain vaccines, such as:  Influenza vaccine. This is recommended every year.  Tetanus, diphtheria, and acellular pertussis (Tdap, Td) vaccine. You may need a Td booster every 10 years.  Zoster vaccine. You may need this after age 62.  Pneumococcal 13-valent conjugate (PCV13) vaccine. One dose is recommended after age 33.  Pneumococcal polysaccharide (PPSV23) vaccine. One dose is recommended after age 77. Talk to your health care provider about which screenings and vaccines you need and how often you need them. This information is not intended to replace advice given to you by your health care provider. Make sure you discuss any questions you have with your health care provider. Document Released: 02/08/2015 Document Revised: 10/02/2015 Document Reviewed: 11/13/2014 Elsevier Interactive Patient Education  2017 [place] Prevention in the Home Falls can cause injuries. They can happen to people of all ages. There are many things you can do to make your home safe and to help prevent falls. What can I do on the outside of my home?  Regularly fix the edges of walkways and driveways and fix any cracks.  Remove anything that might make you trip as you walk through a door, such as a raised step or threshold.  Trim any bushes or trees on the path to your home.  Use bright outdoor lighting.  Clear any walking paths of anything that might make someone trip, such as rocks or tools.  Regularly check to see if handrails are loose or broken. Make sure that both sides of any steps have handrails.  Any  raised decks and porches should have guardrails on the edges.  Have any leaves, snow, or ice cleared regularly.  Use sand or salt on walking paths during winter.  Clean up any spills in your garage right away. This includes oil or grease spills. What can I do in the bathroom?  Use night lights.  Install grab bars by the toilet and in the tub and shower. Do not use towel bars as grab bars.  Use non-skid mats or decals in the tub or shower.  If you need to sit down in the shower, use a plastic, non-slip stool.  Keep the floor dry. Clean up any water that spills on the floor as soon as it happens.  Remove soap buildup in the tub or shower regularly.  Attach bath mats securely with double-sided non-slip rug tape.  Do not have throw rugs and other things on the floor that can make you trip. What can I do in the bedroom?  Use night lights.  Make sure that you have a light by your bed that is easy to reach.  Do not use any sheets or blankets that are too big for your bed. They should not hang down onto the floor.  Have a firm chair that has side arms. You can use this for support while you get dressed.  Do not have throw rugs and other things on the floor that can make you trip. What can I do in the kitchen?  Clean up any spills right away.  Avoid walking on wet floors.  Keep items that you use a lot in easy-to-reach places.  If you need to reach something above you, use a strong step stool that has a grab bar.  Keep electrical cords out of the way.  Do not use floor polish or wax that makes floors slippery. If you must use wax, use non-skid floor wax.  Do not have throw rugs and other things on the floor that can make you trip. What can I do with my stairs?  Do not leave any items on the stairs.  Make sure that there are handrails on both sides of the stairs and use them. Fix handrails that are broken or loose. Make sure that handrails are as long as the  stairways.  Check any carpeting to make sure that it is firmly attached to the stairs. Fix any carpet that is loose or worn.  Avoid having throw rugs at the top or bottom of the stairs. If you do have throw rugs, attach them to the floor with carpet tape.  Make sure that you have a light switch at the top of the stairs and the bottom of the stairs. If you do not have them, ask someone to add them for you. What else can I do to help prevent falls?  Wear shoes that:  Do not have high heels.  Have rubber bottoms.  Are comfortable and fit you well.  Are closed at the toe. Do not wear sandals.  If you use a stepladder:  Make sure that it is fully opened. Do not climb a closed stepladder.  Make sure that both sides of the stepladder are locked into place.  Ask someone to hold it for you, if possible.  Clearly mark and make sure that you can see:  Any grab bars or handrails.  First and last steps.  Where the edge of each step is.  Use tools that help you move around (mobility aids) if they are needed. These include:  Canes.  Walkers.  Scooters.  Crutches.  Turn on the lights when you go into a dark area. Replace any light bulbs as soon as they burn out.  Set up your furniture so you have a clear path. Avoid moving your furniture around.  If any of your floors are uneven, fix them.  If there are any pets around you, be aware of where they are.  Review your medicines with your doctor. Some medicines can make you feel dizzy. This can increase your chance of falling. Ask your doctor what other things that you can do to help prevent falls. This information is not intended to replace advice given to you by your health care provider. Make sure you discuss any questions you have with your health care provider. Document Released: 11/08/2008 Document Revised: 06/20/2015 Document Reviewed: 02/16/2014 Elsevier Interactive Patient Education  2017 [employer].

## 2019-03-06 NOTE — Progress Notes (Signed)
Subjective:   [name] [name] is a 74 y.o. female who presents for Medicare Annual (Subsequent) preventive examination.  Ms. Dehay is doing well at this time. She continues to have chronic GI issues and suggested she schedule an appointment with Dr. Henrene Pastor to follow up. She reports "walking the floors at night". Carafate and protonix did not provide her with any relief. She reported problems with carafate being so large but also stated if we were to prescribe it in liquid form that she would have vomiting with that.   Review of Systems:  No ROS: Annual Medicare Wellness Visit today Cardiac Risk Factors include: advanced age (>7men, >86 women);dyslipidemia;hypertension     Objective:     Vitals: BP (!) 154/68   Temp 97.7 F (36.5 C)   Ht 5\' 3"  (1.6 m)   Wt 143 lb 6.4 oz (65 kg)   BMI 25.40 kg/m   Body mass index is 25.4 kg/m.  Advanced Directives 03/06/2019 05/07/2017  Does Patient Have a Medical Advance Directive? Yes Yes  Type of Paramedic of [place];Living will [place];Living will  Does patient want to make changes to medical advance directive? No - Patient declined No - Patient declined  Copy of [place] in Chart? No - copy requested No - copy requested    Tobacco Social History   Tobacco Use  Smoking Status Former Smoker  . Years: 30.00  . Types: Cigarettes  . Quit date: 04/02/2012  . Years since quitting: 6.9  Smokeless Tobacco Never Used     Counseling given: Not Answered   Clinical Intake:  Pre-visit preparation completed: Yes  Pain : No/denies pain     BMI - recorded: 25.4 Nutritional Status: BMI 25 -29 Overweight Nutritional Risks: Other (Comment) Diabetes: No  How often do you need to have someone help you when you read instructions, pamphlets, or other written materials from your doctor or pharmacy?: 1 - Never  Interpreter Needed?: No  Information entered by :: Franne Forts,  LPN.  Past Medical History:  Diagnosis Date  . Aorto-iliac atherosclerosis ([place])    last aaa duplex 08-30-2015  >50% stenosis bilateral common iliac arteries and left external iliac artery  . Arrhythmia   . Carotid stenosis, bilateral    cardiologist-- dr Stanford Breed ([place] 08-06-2015)  04-29-2017 followed by pcp/  last duplex 03-08-2017 bilateral ICA 40-59% post endarterectomy,  right ECA occluded  . Cerebral vascular disease   . Chronic gastritis   . Elevated cholesterol   . Esophageal stricture   . Full dentures   . GERD (gastroesophageal reflux disease)   . Hiatal hernia   . History of aortic valve disorder    per cardiology note dated 08-06-2015 by dr Stanford Breed pt has hx small fibroblastoma aortic valve per echo 2008 /  last echo 08-23-2015 no fibroblastoma noted or stenosis or thickened/ calcification  . History of gastric ulcer 2013  . History of nuclear stress test 06-17-2006   per dr Stanford Breed note dated 08-06-2015 , normal study  . History of transient ischemic attack (TIA) 04/25/2006   severe left ICA stenosis/   04-29-2017 per pt no residual  . HLD (hyperlipidemia)   . HTN (hypertension)   . IBS (irritable bowel syndrome)   . OA (osteoarthritis)   . Pneumonia   . Prolapsed internal hemorrhoids, grade 3   . PVD (peripheral vascular disease) ([place])   . Rectal bleed   . SYMPTOM, ECCHYMOSES, SPONTANEOUS 06/16/2006   Qualifier:  Diagnosis of  By: Leanne Chang MD, Bruce    . Wears contact lenses    Past Surgical History:  Procedure Laterality Date  . CAROTID ENDARTERECTOMY Bilateral left 04-29-2006 and right 08-17-2007 by  dr c. Scot Dock  [facility]  . HEMORRHOID SURGERY N/A 05/07/2017   Procedure: ANAL EXAM UNDER ANESTHESIA, HEMORRHOIDECTOMY;  Surgeon: Leighton Ruff, MD;  Location: [facility];  Service: General;  Laterality: N/A;  . NASAL SINUS SURGERY  age 22  . TONSILLECTOMY  age 42  . TRANSTHORACIC ECHOCARDIOGRAM  08/23/2015   ef [id number], grade 1 diastolic dysfunction/  mild AR and MR  . VASCULAR SURGERY Bilateral    carotids  . WRIST GANGLION EXCISION Right yrs ago   Family History  Problem Relation Age of Onset  . Stroke Father   . Diabetes Mother   . Asthma Mother   . Heart disease Brother   . Diabetes Other   . Breast cancer Other   . Colon cancer Neg Hx    Social History   Socioeconomic History  . Marital status: Widowed    Spouse name: Not on file  . Number of children: 0  . Years of education: 20  . Highest education level: Associate degree: occupational, Hotel manager, or vocational program  Occupational History  . Occupation: [employer]    Comment: retired  Tobacco Use  . Smoking status: Former Smoker    Years: 30.00    Types: Cigarettes    Quit date: 04/02/2012    Years since quitting: 6.9  . Smokeless tobacco: Never Used  Substance and Sexual Activity  . Alcohol use: No  . Drug use: No  . Sexual activity: Not on file  Other Topics Concern  . Not on file  Social History Narrative    widow   Worked for [employer].       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 Difficulty of Paying Living Expenses: Not hard at all  Food Insecurity: No Food Insecurity  . Worried About Charity fundraiser in the Last Year: Never true  . Ran Out of Food in the Last Year: Never true  Transportation Needs: No Transportation Needs  . Lack of Transportation (Medical): No  . Lack of Transportation (Non-Medical): No  Physical Activity: Sufficiently Active  . Days of Exercise per Week: 7 days  . Minutes of Exercise per Session: 40 min  Stress: No Stress Concern Present  . Feeling of Stress : Not at all  Social  Connections: Slightly Isolated  . Frequency of Communication with Friends and Family: More than three times a week  . Frequency of Social Gatherings with Friends and Family: Not on file  . Attends Religious Services: More than 4 times per year  . Active Member of Clubs or Organizations: Yes  . Attends Archivist Meetings: Not on file  . Marital Status: Widowed    Outpatient Encounter Medications as of 03/06/2019  Medication Sig  . aspirin EC 81 MG tablet Take 1 tablet (81 mg total) by mouth daily.  Marland Kitchen buPROPion (WELLBUTRIN XL) 300 MG 24 hr tablet TAKE 1 TABLET BY MOUTH ONCE DAILY, (PATIENT NEEDS TO CONTACT OFFICE FOR  ADDITIONAL REFILLS).  Marland Kitchen clopidogrel (PLAVIX) 75 MG tablet Take 1 tablet by mouth once daily  . lisinopril (ZESTRIL) 10 MG tablet Take 1 tablet (10 mg total) by mouth daily.  . metoprolol succinate (TOPROL-XL) 50 MG 24 hr tablet TAKE 1 TABLET BY MOUTH ONCE DAILY. TAKE WITH OR IMMEDIATELY FOLLOWING A MEAL.  . traZODone (DESYREL) 50 MG tablet TAKE 1/2 TO 1 (ONE-HALF TO ONE) TABLET BY MOUTH AT BEDTIME AS NEEDED  . vitamin B-12 (CYANOCOBALAMIN) 1000 MCG tablet Take 1,000 mcg by mouth daily.  . AMBULATORY NON FORMULARY MEDICATION 300 mg as directed. Medication name Inclisiran sodium vs placebo. Patient taking differently: Inject into the skin as directed. Per pt in [place]  cardiology research trial  . meloxicam (MOBIC) 15 MG tablet Take 15 mg by mouth daily.   . pantoprazole (PROTONIX) 40 MG tablet Take 1 tablet (40 mg total) by mouth 2 (two) times daily. (Patient not taking: Reported on 03/06/2019)  . sucralfate (CARAFATE) 1 g tablet Take 1 tablet (1 g total) by mouth 4 (four) times daily -  with meals and at bedtime. (Patient not taking: Reported on 03/06/2019)   No facility-administered encounter medications on file as of 03/06/2019.    Activities of Daily Living In your present state of health, do you have any difficulty performing the following activities: 03/06/2019    Hearing? N  Vision? N  Difficulty concentrating or making decisions? N  Walking or climbing stairs? N  Dressing or bathing? N  Doing errands, shopping? N  Preparing Food and eating ? N  Using the Toilet? N  In the past six months, have you accidently leaked urine? N  Do you have problems with loss of bowel control? N  Managing your Medications? N  Managing your Finances? N  Housekeeping or managing your Housekeeping? N  Some recent data might be hidden    Patient Care Team: Caren Macadam, MD as PCP - General (Family Medicine) Stanford Breed Denice Bors, MD as Consulting Physician (Cardiology) Belva Bertin (Ophthalmology)    Assessment:   This is a routine wellness examination for Jenissa.  Exercise Activities and Dietary recommendations Current Exercise Habits: Home exercise routine, Type of exercise: treadmill, Time (Minutes): 30, Frequency (Times/Week): 7, Weekly Exercise (Minutes/Week): 210, Intensity: Moderate, Exercise limited by: orthopedic condition(s)(arthritis)  Goals    . Patient Stated     Improvement in GI problems       Fall Risk Fall Risk  03/06/2019 03/02/2018  Falls in the past year? 0 0  Follow up Falls evaluation completed;Education provided;Falls prevention discussed -   Is the patient's home free of loose throw rugs in walkways, pet beds, electrical cords, etc?   yes      Grab bars in the bathroom? yes      Handrails on the stairs?   yes      Adequate lighting?   yes  Timed Get Up and Go performed: Normal  Depression Screen PHQ 2/9 Scores 03/06/2019 03/02/2018 03/02/2018  PHQ - 2 Score 0 0 0  PHQ- 9 Score - 0 5     Cognitive Function     6CIT Screen 03/06/2019  What Year? 0 points  What month? 0 points  What time? 0 points  Count back from 20 0 points  Months in reverse 0 points  Repeat phrase 0 points  Total Score 0    Immunization History  Administered Date(s) Administered  . Influenza-Unspecified 01/26/2018  . Pneumococcal Conjugate-13  12/11/2013, 03/02/2018  .  Pneumococcal Polysaccharide-23 01/24/2015  . Tdap 01/26/2009  . Zoster Recombinat (Shingrix) 03/02/2018, 09/02/2018    Qualifies for Shingles Vaccine?patient has already completed.   Screening Tests Health Maintenance  Topic Date Due  . DEXA SCAN  03/23/2009  . TETANUS/TDAP  01/27/2019  . INFLUENZA VACCINE  01/26/2020 (Originally 08/27/2018)  . COLONOSCOPY  06/18/2021  . Hepatitis C Screening  Completed  . PNA vac Low Risk Adult  Completed  . MAMMOGRAM  Discontinued    Cancer Screenings: Lung: Low Dose CT Chest recommended if Age 87-80 years, 30 pack-year currently smoking OR have quit w/in 15years. Patient does not qualify. Breast:  Up to date on Mammogram? No  But declines any further. Up to date of Bone Density/Dexa? No; order sent to [facility] today. Colorectal: yes  Additional Screenings:  Hepatitis C Screening: completed 03/09/2018.     Plan:   Ms. Sindoni was encouraged to follow up with GI for her continued problems. [facility] was [facility] to [facility] for DEXA scan. Patient declines to do any future mammograms due to a bad experience in 2018. Tdap is due this year as well. Patient declined to pay out of pocket for Tdap at this time.    I have personally reviewed and noted the following in the patient's chart:   . Medical and social history . Use of alcohol, tobacco or illicit drugs  . Current medications and supplements . Functional ability and status . Nutritional status . Physical activity . Advanced directives . List of other physicians . Hospitalizations, surgeries, and ER visits in previous 12 months . Vitals . Screenings to include cognitive, depression, and falls . Referrals and appointments  In addition, I have reviewed and discussed with patient certain preventive protocols, quality metrics, and best practice recommendations. A written personalized care plan for preventive services as well as general preventive health  recommendations were provided to patient.     Franne Forts, LPN  [id number]

## 2019-03-13 ENCOUNTER — Ambulatory Visit (HOSPITAL_COMMUNITY): Payer: Medicare PPO

## 2019-03-14 ENCOUNTER — Other Ambulatory Visit: Payer: Self-pay | Admitting: Family Medicine

## 2019-03-14 DIAGNOSIS — I10 Essential (primary) hypertension: Secondary | ICD-10-CM

## 2019-03-17 ENCOUNTER — Encounter: Payer: Self-pay | Admitting: Family Medicine

## 2019-03-17 ENCOUNTER — Other Ambulatory Visit: Payer: Self-pay

## 2019-03-17 ENCOUNTER — Telehealth (INDEPENDENT_AMBULATORY_CARE_PROVIDER_SITE_OTHER): Payer: Medicare PPO | Admitting: Family Medicine

## 2019-03-17 ENCOUNTER — Telehealth: Payer: Self-pay | Admitting: *Deleted

## 2019-03-17 DIAGNOSIS — M1991 Primary osteoarthritis, unspecified site: Secondary | ICD-10-CM

## 2019-03-17 DIAGNOSIS — E559 Vitamin D deficiency, unspecified: Secondary | ICD-10-CM

## 2019-03-17 DIAGNOSIS — E538 Deficiency of other specified B group vitamins: Secondary | ICD-10-CM | POA: Diagnosis not present

## 2019-03-17 DIAGNOSIS — K219 Gastro-esophageal reflux disease without esophagitis: Secondary | ICD-10-CM

## 2019-03-17 DIAGNOSIS — I10 Essential (primary) hypertension: Secondary | ICD-10-CM

## 2019-03-17 DIAGNOSIS — M2550 Pain in unspecified joint: Secondary | ICD-10-CM

## 2019-03-17 DIAGNOSIS — R7301 Impaired fasting glucose: Secondary | ICD-10-CM | POA: Diagnosis not present

## 2019-03-17 DIAGNOSIS — E7800 Pure hypercholesterolemia, unspecified: Secondary | ICD-10-CM

## 2019-03-17 MED ORDER — DEXILANT 30 MG PO CPDR: 30.0000 mg | DELAYED_RELEASE_CAPSULE | Freq: Every day | ORAL | 1 refills | Status: DC

## 2019-03-17 NOTE — Telephone Encounter (Signed)
Spoke with the pt and scheduled a lab appt for 2/22.  Message sent to Tammy to contact the pt for the appt for a bone density test at the [facility] office.

## 2019-03-17 NOTE — Progress Notes (Signed)
Virtual Visit via Telephone Note  I connected with [name] [name]  on 03/17/19 at 10:00 AM EST by telephone and verified that I am speaking with the correct person using two identifiers.   I discussed the limitations, risks, security and privacy concerns of performing an evaluation and management service by telephone and the availability of in person appointments. I also discussed with the patient that there may be a patient responsible charge related to this service. The patient expressed understanding and agreed to proceed.  Location patient: home Location provider: work office Participants present for the call: patient, provider Patient did not have a visit in the prior 7 days to address this/these issue(s).   History of Present Illness: Has a few concerns today:  *stomach issues: had evaluation in July. Prior to doing testing, we put her on medication (sucralfate). Tried this and ended up cutting in half due to difficulty with swallow. Didn't note any improvement with these. Flip side is that all tests came back ok, but having worst time with stomach. Even half glass water kills her. Walking floors all night in pain. To point now where she starts throwing up. Like water and acid coming up; burns in throat. Endoscopy was normal. Abd [place] was normal (gallbladder normal, liver normal). Abd pain is right up in center of abd under breasts up by where ribs meet. Just feels like knot there. Stops eating at 5. Can't eat anything or drink anything at night to make it feel better. Did get some nexium and although insurance wouldn't pay for it (so she got it otc); nexium did help. Mostly in the evenings when she has symptoms. Does hurt every time she eats. In daytime she is active, moving around so doesn't bother her as much. She is not taking the mobic every day. Feels she takes just occasionally. Does use tylenol arthritis for pain. She is taking ASA daily (per cardiology)   Arthritis: states that she  feels there is worsening with every joint every day. Doesn't know difference between osteo vs rheumatoid. Getting to where when she stands up she has to wait to start walking because feels joints will give out if she doesn't. Does have hours of morning stiffness.   HTN: On metoprolol, lisinopril. Blood pressures are "not bad". Always higher at doctor. Diastolic [id number] and Systolic [id number].   Mini stroke in 2008. Has had bilat carotid endarterectomy. Stays on plavix, ASA.   Impaired fasting glucose.    Observations/Objective: Patient sounds cheerful and well on the phone. I do not appreciate any SOB. Speech and thought processing are grossly intact. Patient reported vitals: none today yet.  Assessment and Plan: 1. Essential hypertension Stable; continue with current medication.  - CBC with Differential/Platelet; Future - Comprehensive metabolic panel; Future  2. Gastroesophageal reflux disease, unspecified whether esophagitis present Will touch base with GI; may need follow up with them. Trial dexilant. - Dexlansoprazole (DEXILANT) 30 MG capsule; Take 1 capsule (30 mg total) by mouth daily.  Dispense: 90 capsule; Refill: 1  3. Primary osteoarthritis, unspecified site Uncertain if osteoarthritis but concerned for increasing pain, stiffness, etc.   4. HYPERCHOLESTEROLEMIA In research group for this; will recheck baseline.  - Lipid panel; Future - TSH; Future  5. Arthralgia, unspecified joint See above. - Sedimentation rate; Future - C-reactive protein; Future - Rheumatoid factor; Future - ANA; Future  6. Impaired fasting glucose - Hemoglobin A1c; Future  7. Vitamin D deficiency - VITAMIN D 25 Hydroxy (Vit-D Deficiency, Fractures); Future  8. B12 deficiency - Vitamin B12; Future   Follow Up Instructions: Return for bloodwork when able.   [id number] 5-10 [id number] 11-20 9443 21-30 I did not refer this patient for an OV in the next 24 hours for this/these issue(s).  I  discussed the assessment and treatment plan with the patient. The patient was provided an opportunity to ask questions and all were answered. The patient agreed with the plan and demonstrated an understanding of the instructions.   The patient was advised to call back or seek an in-person evaluation if the symptoms worsen or if the condition fails to improve as anticipated.  I provided 35 minutes of non-face-to-face time during this encounter.   Micheline Rough, MD

## 2019-03-17 NOTE — Telephone Encounter (Signed)
-----   Message from Caren Macadam, MD sent at 03/17/2019 10:32 AM EST ----- Please arrange for bloodwork when able

## 2019-03-20 ENCOUNTER — Telehealth: Payer: Self-pay

## 2019-03-20 ENCOUNTER — Other Ambulatory Visit: Payer: Self-pay

## 2019-03-20 ENCOUNTER — Ambulatory Visit (INDEPENDENT_AMBULATORY_CARE_PROVIDER_SITE_OTHER)
Admission: RE | Admit: 2019-03-20 | Discharge: 2019-03-20 | Disposition: A | Discharge status: Home / Self Care | Payer: Medicare PPO | Source: Ambulatory Visit | Attending: Family Medicine | Admitting: Family Medicine

## 2019-03-20 ENCOUNTER — Other Ambulatory Visit (INDEPENDENT_AMBULATORY_CARE_PROVIDER_SITE_OTHER): Payer: Medicare PPO

## 2019-03-20 DIAGNOSIS — E559 Vitamin D deficiency, unspecified: Secondary | ICD-10-CM

## 2019-03-20 DIAGNOSIS — E538 Deficiency of other specified B group vitamins: Secondary | ICD-10-CM

## 2019-03-20 DIAGNOSIS — M2550 Pain in unspecified joint: Secondary | ICD-10-CM | POA: Diagnosis not present

## 2019-03-20 DIAGNOSIS — M898X9 Other specified disorders of bone, unspecified site: Secondary | ICD-10-CM | POA: Diagnosis not present

## 2019-03-20 DIAGNOSIS — I10 Essential (primary) hypertension: Secondary | ICD-10-CM | POA: Diagnosis not present

## 2019-03-20 DIAGNOSIS — E7800 Pure hypercholesterolemia, unspecified: Secondary | ICD-10-CM

## 2019-03-20 DIAGNOSIS — Z13820 Encounter for screening for osteoporosis: Secondary | ICD-10-CM

## 2019-03-20 DIAGNOSIS — R768 Other specified abnormal immunological findings in serum: Secondary | ICD-10-CM

## 2019-03-20 DIAGNOSIS — R7301 Impaired fasting glucose: Secondary | ICD-10-CM | POA: Diagnosis not present

## 2019-03-20 DIAGNOSIS — Z780 Asymptomatic menopausal state: Secondary | ICD-10-CM

## 2019-03-20 LAB — VITAMIN D 25 HYDROXY (VIT D DEFICIENCY, FRACTURES): VITD: 29.84 ng/mL — ABNORMAL LOW (ref 30.00–100.00)

## 2019-03-20 LAB — COMPREHENSIVE METABOLIC PANEL
ALT: 21 U/L (ref 0–35)
AST: 21 U/L (ref 0–37)
Albumin: 4.7 g/dL (ref 3.5–5.2)
Alkaline Phosphatase: 94 U/L (ref 39–117)
BUN: 19 mg/dL (ref 6–23)
CO2: 24 mEq/L (ref 19–32)
Calcium: 9.8 mg/dL (ref 8.4–10.5)
Chloride: 104 mEq/L (ref 96–112)
Creatinine, Ser: 1.02 mg/dL (ref 0.40–1.20)
GFR: 52.83 mL/min — ABNORMAL LOW (ref >60.00)
Glucose, Bld: 111 mg/dL — ABNORMAL HIGH (ref 70–99)
Potassium: 4.1 mEq/L (ref 3.5–5.1)
Sodium: 137 mEq/L (ref 135–145)
Total Bilirubin: 0.4 mg/dL (ref 0.2–1.2)
Total Protein: 7 g/dL (ref 6.0–8.3)

## 2019-03-20 LAB — LIPID PANEL
Cholesterol: 160 mg/dL (ref 0–200)
HDL: 62.9 mg/dL (ref >39.00)
NonHDL: 96.63
Total CHOL/HDL Ratio: 3
Triglycerides: 215 mg/dL — ABNORMAL HIGH (ref 0.0–149.0)
VLDL: 43 mg/dL — ABNORMAL HIGH (ref 0.0–40.0)

## 2019-03-20 LAB — CBC WITH DIFFERENTIAL/PLATELET
Basophils Absolute: 0.1 10*3/uL (ref 0.0–0.1)
Basophils Relative: 1.6 % (ref 0.0–3.0)
Eosinophils Absolute: 0.1 10*3/uL (ref 0.0–0.7)
Eosinophils Relative: 1.9 % (ref 0.0–5.0)
HCT: 41.2 % (ref 36.0–46.0)
Hemoglobin: 13.6 g/dL (ref 12.0–15.0)
Lymphocytes Relative: 25.9 % (ref 12.0–46.0)
Lymphs Abs: 1.2 10*3/uL (ref 0.7–4.0)
MCHC: 33.1 g/dL (ref 30.0–36.0)
MCV: 91.4 fl (ref 78.0–100.0)
Monocytes Absolute: 0.4 10*3/uL (ref 0.1–1.0)
Monocytes Relative: 8.8 % (ref 3.0–12.0)
Neutro Abs: 2.8 10*3/uL (ref 1.4–7.7)
Neutrophils Relative %: 61.8 % (ref 43.0–77.0)
Platelets: 250 10*3/uL (ref 150.0–400.0)
RBC: 4.51 Mil/uL (ref 3.87–5.11)
RDW: 12.9 % (ref 11.5–15.5)
WBC: 4.5 10*3/uL (ref 4.0–10.5)

## 2019-03-20 LAB — VITAMIN B12: Vitamin B-12: 792 pg/mL (ref 211–911)

## 2019-03-20 LAB — HEMOGLOBIN A1C: Hgb A1c MFr Bld: 6.1 % (ref 4.6–6.5)

## 2019-03-20 LAB — TSH: TSH: 1.34 u[IU]/mL (ref 0.35–4.50)

## 2019-03-20 LAB — LDL CHOLESTEROL, DIRECT: Direct LDL: 76 mg/dL

## 2019-03-20 LAB — C-REACTIVE PROTEIN: CRP: <1 mg/dL (ref 0.5–20.0)

## 2019-03-20 LAB — CK: Total CK: 57 U/L (ref 7–177)

## 2019-03-20 LAB — SEDIMENTATION RATE: Sed Rate: 21 mm/hr (ref 0–30)

## 2019-03-20 NOTE — Telephone Encounter (Signed)
Pt scheduled to see Tye Savoy NP 04/05/19 @10am . Appt letter mailed to pt.

## 2019-03-20 NOTE — Telephone Encounter (Signed)
Pt scheduled to see Tye Savoy NP 04/05/19@10am . Appt letter mailed to pt.

## 2019-03-20 NOTE — Telephone Encounter (Signed)
-----   Message from [name] Shipper, MD sent at 03/17/2019 11:13 AM EST ----- Hi Dr. Ethlyn Gallery, I have not seen her in 7 months.  This may be reflux related in which case reflux precautions and compliance with some sort of PPI (generic omeprazole 40 mg, hopefully if affordable) daily may do the trick.  Just to be sure, I will get her a follow-up appointment with my nurse practitioner that saw her last summer.  Thanks for reaching out. [name] [name], please arrange an office visit with [name] Savoy, NP.  [name] [name] ----- Message ----- From: [name] Macadam, MD Sent: 03/17/2019  10:40 AM EST To: [name] Shipper, MD  Just curious if you had any further suggestions for patient/want a follow up. Still with epigastric discomfort; worse at night. Got a little relief with the nexium she tried, but pricey for her. Saw EGD normal, Abd [place] normal. Now with some vomiting/acid intermittent. I am going to send dexilant for her to try in meanwhile.

## 2019-03-20 NOTE — Telephone Encounter (Signed)
-----   Message from Irene Shipper, MD sent at 03/17/2019 11:13 AM EST ----- Hi Dr. Ethlyn Gallery, I have not seen her in 7 months.  This may be reflux related in which case reflux precautions and compliance with some sort of PPI (generic omeprazole 40 mg, hopefully if affordable) daily may do the trick.  Just to be sure, I will get her a follow-up appointment with my nurse practitioner that saw her last summer.  Thanks for reaching out. Vaughan Basta, please arrange an office visit with Tye Savoy, NP.  Hyman Bower ----- Message ----- From: Caren Macadam, MD Sent: 03/17/2019  10:40 AM EST To: Irene Shipper, MD  Just curious if you had any further suggestions for patient/want a follow up. Still with epigastric discomfort; worse at night. Got a little relief with the nexium she tried, but pricey for her. Saw EGD normal, Abd [place] normal. Now with some vomiting/acid intermittent. I am going to send dexilant for her to try in meanwhile.

## 2019-03-21 ENCOUNTER — Other Ambulatory Visit: Payer: Self-pay | Admitting: Cardiology

## 2019-03-21 ENCOUNTER — Encounter: Payer: Self-pay | Admitting: *Deleted

## 2019-03-21 ENCOUNTER — Other Ambulatory Visit: Payer: Self-pay | Admitting: *Deleted

## 2019-03-21 ENCOUNTER — Ambulatory Visit (HOSPITAL_COMMUNITY)
Admission: RE | Admit: 2019-03-21 | Discharge: 2019-03-21 | Disposition: A | Discharge status: Home / Self Care | Payer: Medicare PPO | Source: Ambulatory Visit | Attending: Cardiology | Admitting: Cardiology

## 2019-03-21 DIAGNOSIS — I6523 Occlusion and stenosis of bilateral carotid arteries: Secondary | ICD-10-CM | POA: Diagnosis not present

## 2019-03-21 DIAGNOSIS — I679 Cerebrovascular disease, unspecified: Secondary | ICD-10-CM

## 2019-03-21 NOTE — Progress Notes (Signed)
vas 

## 2019-03-22 LAB — ANTI-NUCLEAR AB-TITER (ANA TITER)
ANA TITER: 1:160 {titer} — ABNORMAL HIGH
ANA Titer 1: 1:80 {titer} — ABNORMAL HIGH

## 2019-03-22 LAB — ANA: Anti Nuclear Antibody (ANA): POSITIVE — AB

## 2019-03-22 LAB — RHEUMATOID FACTOR: Rheumatoid fact SerPl-aCnc: <14 IU/mL (ref <14)

## 2019-03-24 ENCOUNTER — Telehealth: Payer: Self-pay | Admitting: Family Medicine

## 2019-03-24 NOTE — Telephone Encounter (Signed)
Pt had a virtual appt on 03/17/19 and would like to know if a referral was put in for her stomach ache and ear pain?  Pt does not remember if she mentioned a referral during her virtual appt.. Pt would also like to know if she needs to find someone or if the office will referral her. Thanks

## 2019-03-24 NOTE — Telephone Encounter (Signed)
I touched base with GI after her visit and they called her and she is seeing NP on 3/10 in follow up for abdominal issues (they set this up based on my discussion with GI). I do not recall talking about ear pain, but if she can give details we can certainly help with referral if needed!

## 2019-03-25 ENCOUNTER — Other Ambulatory Visit: Payer: Self-pay

## 2019-03-25 ENCOUNTER — Emergency Department (HOSPITAL_COMMUNITY): Payer: Medicare PPO

## 2019-03-25 ENCOUNTER — Encounter (HOSPITAL_COMMUNITY): Payer: Self-pay

## 2019-03-25 ENCOUNTER — Emergency Department (HOSPITAL_COMMUNITY)
Admission: EM | Admit: 2019-03-25 | Discharge: 2019-03-25 | Disposition: A | Discharge status: Home / Self Care | Payer: Medicare PPO | Attending: Emergency Medicine | Admitting: Emergency Medicine

## 2019-03-25 DIAGNOSIS — I10 Essential (primary) hypertension: Secondary | ICD-10-CM | POA: Insufficient documentation

## 2019-03-25 DIAGNOSIS — R110 Nausea: Secondary | ICD-10-CM | POA: Diagnosis not present

## 2019-03-25 DIAGNOSIS — R42 Dizziness and giddiness: Secondary | ICD-10-CM

## 2019-03-25 DIAGNOSIS — Z1239 Encounter for other screening for malignant neoplasm of breast: Secondary | ICD-10-CM

## 2019-03-25 DIAGNOSIS — Z7982 Long term (current) use of aspirin: Secondary | ICD-10-CM | POA: Diagnosis not present

## 2019-03-25 DIAGNOSIS — N644 Mastodynia: Secondary | ICD-10-CM

## 2019-03-25 DIAGNOSIS — I6523 Occlusion and stenosis of bilateral carotid arteries: Secondary | ICD-10-CM | POA: Diagnosis not present

## 2019-03-25 DIAGNOSIS — Z79899 Other long term (current) drug therapy: Secondary | ICD-10-CM | POA: Diagnosis not present

## 2019-03-25 DIAGNOSIS — Z87891 Personal history of nicotine dependence: Secondary | ICD-10-CM | POA: Insufficient documentation

## 2019-03-25 LAB — CBC WITH DIFFERENTIAL/PLATELET
Abs Immature Granulocytes: 0.01 10*3/uL (ref 0.00–0.07)
Basophils Absolute: 0 10*3/uL (ref 0.0–0.1)
Basophils Relative: 1 %
Eosinophils Absolute: 0 10*3/uL (ref 0.0–0.5)
Eosinophils Relative: 1 %
HCT: 40.2 % (ref 36.0–46.0)
Hemoglobin: 13.1 g/dL (ref 12.0–15.0)
Immature Granulocytes: 0 %
Lymphocytes Relative: 13 %
Lymphs Abs: 0.7 10*3/uL (ref 0.7–4.0)
MCH: 30.3 pg (ref 26.0–34.0)
MCHC: 32.6 g/dL (ref 30.0–36.0)
MCV: 92.8 fL (ref 80.0–100.0)
Monocytes Absolute: 0.3 10*3/uL (ref 0.1–1.0)
Monocytes Relative: 5 %
Neutro Abs: 4.6 10*3/uL (ref 1.7–7.7)
Neutrophils Relative %: 80 %
Platelets: 205 10*3/uL (ref 150–400)
RBC: 4.33 MIL/uL (ref 3.87–5.11)
RDW: 12.4 % (ref 11.5–15.5)
WBC: 5.7 10*3/uL (ref 4.0–10.5)
nRBC: 0 % (ref 0.0–0.2)

## 2019-03-25 LAB — URINALYSIS, ROUTINE W REFLEX MICROSCOPIC
Bilirubin Urine: NEGATIVE
Glucose, UA: NEGATIVE mg/dL
Hgb urine dipstick: NEGATIVE
Ketones, ur: NEGATIVE mg/dL
Leukocytes,Ua: NEGATIVE
Nitrite: NEGATIVE
Protein, ur: NEGATIVE mg/dL
Specific Gravity, Urine: 1.014 (ref 1.005–1.030)
pH: 5 (ref 5.0–8.0)

## 2019-03-25 LAB — BASIC METABOLIC PANEL
Anion gap: 6 (ref 5–15)
BUN: 18 mg/dL (ref 8–23)
CO2: 24 mmol/L (ref 22–32)
Calcium: 8.7 mg/dL — ABNORMAL LOW (ref 8.9–10.3)
Chloride: 108 mmol/L (ref 98–111)
Creatinine, Ser: 0.79 mg/dL (ref 0.44–1.00)
GFR calc Af Amer: >60 mL/min (ref >60)
GFR calc non Af Amer: >60 mL/min (ref >60)
Glucose, Bld: 128 mg/dL — ABNORMAL HIGH (ref 70–99)
Potassium: 4.1 mmol/L (ref 3.5–5.1)
Sodium: 138 mmol/L (ref 135–145)

## 2019-03-25 LAB — CBG MONITORING, ED: Glucose-Capillary: 114 mg/dL — ABNORMAL HIGH (ref 70–99)

## 2019-03-25 MED ORDER — MECLIZINE HCL 25 MG PO TABS
25.0000 mg | ORAL_TABLET | Freq: Once | ORAL | Status: AC
  Administered 2019-03-25: 25 mg via ORAL
  Filled 2019-03-25: qty 1

## 2019-03-25 MED ORDER — IOHEXOL 350 MG/ML SOLN
80.0000 mL | Freq: Once | INTRAVENOUS | Status: AC | PRN
  Administered 2019-03-25: 80 mL via INTRAVENOUS

## 2019-03-25 MED ORDER — LORAZEPAM 2 MG/ML IJ SOLN
1.0000 mg | Freq: Once | INTRAMUSCULAR | Status: AC
  Administered 2019-03-25: 13:00:00 1 mg via INTRAVENOUS
  Filled 2019-03-25: qty 1

## 2019-03-25 MED ORDER — SODIUM CHLORIDE 0.9 % IV BOLUS
1000.0000 mL | Freq: Once | INTRAVENOUS | Status: AC
  Administered 2019-03-25: 1000 mL via INTRAVENOUS

## 2019-03-25 MED ORDER — SODIUM CHLORIDE (PF) 0.9 % IJ SOLN
INTRAMUSCULAR | Status: AC
  Filled 2019-03-25: qty 50

## 2019-03-25 MED ORDER — MECLIZINE HCL 25 MG PO TABS: 25.0000 mg | ORAL_TABLET | Freq: Three times a day (TID) | ORAL | 0 refills | Status: DC | PRN

## 2019-03-25 NOTE — Discharge Instructions (Addendum)
As we discussed, your blood work was reassuring.  Additionally, your MRI did not show any evidence of a stroke that would be causing your symptoms.  This is most likely related to vertigo.  Take meclizine as directed.  Follow-up with referred ear nose and throat doctor as needed.  You can call their office and arrange for an appointment.  Return to the emergency department for any chest pain, numbness/weakness of your arms or legs, difficulty breathing, persistent vomiting, worsening dizziness or any other worsening or concerning symptoms.

## 2019-03-25 NOTE — ED Triage Notes (Signed)
Patient coming from home with complain of sudden onset dizziness and nausea. Patient little hypertensive but she have take her BP med before coming to ED.

## 2019-03-25 NOTE — ED Provider Notes (Signed)
[facility] DEPT Provider Note   CSN: [id number] Arrival date & time: 03/25/19  [id number]     History Chief Complaint  Patient presents with  . Dizziness  . Nausea    [name] FRANCOEUR is a 74 y.o. female with arrhythmia, cerebral vascular disease, elevated cholesterol, GERD, carotid endarterectomy, who presents for evaluation of dizziness and nausea that began acutely this AM when she woke up. She states that whenever she moves her head or gets up from a position, she feels dizzy and feels like the room is spinning. This feeling makes it difficult for her to walk because she feels like she is wobbly and things are spinning around her. This is associated with nausea. No vomiting. She did not fall and hit her head. She states that for the last 2 weeks her ears have been bothering her. She states that occasionally she will get some pulsatile tinnitus. Additionally, she has these episodes where she feels like all of a sudden, the hearing is very far away and she has to pop her ears for it to get back.  She has been seen by her doctor for this with no definitive finding.  She reports some tingling sensation to the tips of her bilateral hands and the tips of her toes on her bilateral feet.  No focal weakness.  Patient denies any chest pain, vomiting, abdominal pain, or Neri complaints.  The history is provided by the patient.       Past Medical History:  Diagnosis Date  . Aorto-iliac atherosclerosis ([place])    last aaa duplex 08-30-2015  >50% stenosis bilateral common iliac arteries and left external iliac artery  . Arrhythmia   . Carotid stenosis, bilateral    cardiologist-- dr Stanford Breed ([place] 08-06-2015)  04-29-2017 followed by pcp/  last duplex 03-08-2017 bilateral ICA 40-59% post endarterectomy,  right ECA occluded  . Cerebral vascular disease   . Chronic gastritis   . Elevated cholesterol   . Esophageal stricture   . Full dentures   . GERD (gastroesophageal reflux  disease)   . Hiatal hernia   . History of aortic valve disorder    per cardiology note dated 08-06-2015 by dr Stanford Breed pt has hx small fibroblastoma aortic valve per echo 2008 /  last echo 08-23-2015 no fibroblastoma noted or stenosis or thickened/ calcification  . History of gastric ulcer 2013  . History of nuclear stress test 06-17-2006   per dr Stanford Breed note dated 08-06-2015 , normal study  . History of transient ischemic attack (TIA) 04/25/2006   severe left ICA stenosis/   04-29-2017 per pt no residual  . HLD (hyperlipidemia)   . HTN (hypertension)   . IBS (irritable bowel syndrome)   . OA (osteoarthritis)   . Pneumonia   . Prolapsed internal hemorrhoids, grade 3   . PVD (peripheral vascular disease) ([place])   . Rectal bleed   . SYMPTOM, ECCHYMOSES, SPONTANEOUS 06/16/2006   Qualifier: Diagnosis of  By: Leanne Chang MD, Bruce    . Wears contact lenses     Patient Active Problem List   Diagnosis Date Noted  . 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 HYPERCHOLESTEROLEMIA 11/16/2008  . Osteoarthritis 11/16/2008  . Hyperlipemia 06/16/2006  . Essential hypertension 06/16/2006  . Cerebrovascular disease 06/16/2006  . GERD 06/16/2006    Past Surgical History:  Procedure Laterality Date  . CAROTID ENDARTERECTOMY Bilateral left 04-29-2006 and right 08-17-2007 by  dr c. Scot Dock  [facility]  . HEMORRHOID SURGERY N/A 05/07/2017   Procedure: ANAL  EXAM UNDER ANESTHESIA, HEMORRHOIDECTOMY;  Surgeon: Leighton Ruff, MD;  Location: [facility];  Service: General;  Laterality: N/A;  . NASAL SINUS SURGERY  age 72  . TONSILLECTOMY  age 80  . TRANSTHORACIC ECHOCARDIOGRAM  08/23/2015   ef [id number], grade 1 diastolic dysfunction/ mild AR and MR  . VASCULAR SURGERY Bilateral    carotids  . WRIST GANGLION EXCISION Right yrs ago     OB History   No obstetric history on file.     Family History  Problem Relation Age of Onset  . Stroke Father   . Diabetes Mother   . Asthma Mother   . Heart disease  Brother   . Diabetes Other   . Breast cancer Other   . Rheum arthritis Sister   . Colon cancer Neg Hx     Social History   Tobacco Use  . Smoking status: Former Smoker    Years: 30.00    Types: Cigarettes    Quit date: 04/02/2012    Years since quitting: 6.9  . Smokeless tobacco: Never Used  Substance Use Topics  . Alcohol use: No  . Drug use: No    Home Medications Prior to Admission medications   Medication Sig Start Date End Date Taking? Authorizing Provider  AMBULATORY NON FORMULARY MEDICATION Inject 300 mg into the skin See admin instructions. Inclisiran Sodium - Inject into the skin as directed. Per pt in [place] cardiology research trial Next appt in May 2021   Yes [provider]  aspirin EC 81 MG tablet Take 1 tablet (81 mg total) by mouth daily. 06/15/17  Yes Lelon Perla, MD  buPROPion (WELLBUTRIN XL) 300 MG 24 hr tablet TAKE 1 TABLET BY MOUTH ONCE DAILY, (PATIENT NEEDS TO CONTACT OFFICE FOR ADDITIONAL REFILLS). Patient taking differently: Take 300 mg by mouth at bedtime.  02/13/19  Yes Koberlein, Steele Berg, MD  clopidogrel (PLAVIX) 75 MG tablet Take 1 tablet by mouth once daily Patient taking differently: Take 75 mg by mouth daily.  01/17/19  Yes Koberlein, Steele Berg, MD  Dexlansoprazole (DEXILANT) 30 MG capsule Take 1 capsule (30 mg total) by mouth daily. 03/17/19  Yes Koberlein, Junell C, MD  lisinopril (ZESTRIL) 10 MG tablet Take 1 tablet by mouth daily Patient taking differently: Take 10 mg by mouth daily.  03/14/19  Yes Koberlein, Junell C, MD  metoprolol succinate (TOPROL-XL) 50 MG 24 hr tablet TAKE 1 TABLET BY MOUTH ONCE DAILY. TAKE WITH OR IMMEDIATELY FOLLOWING A MEAL. Patient taking differently: Take 50 mg by mouth daily.  01/16/19  Yes Koberlein, Junell C, MD  traZODone (DESYREL) 50 MG tablet TAKE 1/2 TO 1 (ONE-HALF TO ONE) TABLET BY MOUTH AT BEDTIME AS NEEDED Patient taking differently: Take 25-50 mg by mouth at bedtime as needed for sleep.  TAKE 1/2 TO 1 (ONE-HALF TO ONE) TABLET BY MOUTH AT BEDTIME AS NEEDED 10/19/18  Yes Koberlein, Junell C, MD  vitamin B-12 (CYANOCOBALAMIN) 1000 MCG tablet Take 1,000 mcg by mouth daily.   Yes [provider]  meclizine (ANTIVERT) 25 MG tablet Take 1 tablet (25 mg total) by mouth 3 (three) times daily as needed for dizziness. 03/25/19   Volanda Napoleon, PA-C    Allergies    Sulfa antibiotics  Review of Systems   Review of Systems  Constitutional: Negative for chills and fever.  HENT: Negative for congestion.   Eyes: Negative for visual disturbance.  Respiratory: Negative for cough and shortness of breath.   Cardiovascular: Negative  for chest pain.  Gastrointestinal: Positive for nausea. Negative for abdominal pain, diarrhea and vomiting.  Genitourinary: Negative for dysuria and hematuria.  Musculoskeletal: Negative for back pain and neck pain.  Skin: Negative for rash.  Neurological: Positive for dizziness. Negative for weakness, numbness and headaches.  All other systems reviewed and are negative.   Physical Exam Updated Vital Signs BP 117/66   Pulse 76   Temp 98.3 F (36.8 C) (Oral)   Resp 10   Ht 5' 2.5" (1.588 m)   Wt 64.4 kg   SpO2 98%   BMI 25.56 kg/m   Physical Exam Vitals and nursing note reviewed.  Constitutional:      Appearance: Normal appearance. She is well-developed.  HENT:     Head: Normocephalic and atraumatic.  Eyes:     General: Lids are normal.     Conjunctiva/sclera: Conjunctivae normal.     Pupils: Pupils are equal, round, and reactive to light.     Comments: PERRL.  Horizontal nystagmus noted to the right.  No vertical rotational nystagmus noted.  Cardiovascular:     Rate and Rhythm: Normal rate and regular rhythm.     Pulses: Normal pulses.          Radial pulses are 2+ on the right side and 2+ on the left side.     Heart sounds: Normal heart sounds. No murmur. No friction rub. No gallop.   Pulmonary:     Effort: Pulmonary effort is  normal.     Breath sounds: Normal breath sounds.  Abdominal:     Palpations: Abdomen is soft. Abdomen is not rigid.     Tenderness: There is no abdominal tenderness. There is no guarding.  Musculoskeletal:        General: Normal range of motion.     Cervical back: Full passive range of motion without pain.  Skin:    General: Skin is warm and dry.     Capillary Refill: Capillary refill takes less than 2 seconds.  Neurological:     Mental Status: She is alert and oriented to person, place, and time.     Comments: Cranial nerves III-XII intact Follows commands, Moves all extremities  5/5 strength to BUE and BLE  Sensation intact throughout all major nerve distributions Normal finger to nose. No dysdiadochokinesia. No pronator drift. Slight gait ataxia  No slurred speech. No facial droop.   Psychiatric:        Speech: Speech normal.     ED Results / Procedures / Treatments   Labs (all labs ordered are listed, but only abnormal results are displayed) Labs Reviewed  BASIC METABOLIC PANEL - Abnormal; Notable for the following components:      Result Value   Glucose, Bld 128 (*)    Calcium 8.7 (*)    All other components within normal limits  CBG MONITORING, ED - Abnormal; Notable for the following components:   Glucose-Capillary 114 (*)    All other components within normal limits  CBC WITH DIFFERENTIAL/PLATELET  URINALYSIS, ROUTINE W REFLEX MICROSCOPIC    EKG EKG Interpretation  Date/Time:  Saturday March 25 2019 09:14:28 EST Ventricular Rate:  77 PR Interval:    QRS Duration: 88 QT Interval:  396 QTC Calculation: 449 R Axis:   79 Text Interpretation: Sinus rhythm Probable anteroseptal infarct, old No significant change since last tracing Confirmed by Theotis Burrow [phone]) on 03/25/2019 9:40:21 AM   Radiology CT Angio Head W or Wo Contrast  Result Date: 03/25/2019 CLINICAL DATA:  Dizziness nausea EXAM: CT ANGIOGRAPHY HEAD AND NECK TECHNIQUE: Multidetector CT  imaging of the head and neck was performed using the standard protocol during bolus administration of intravenous contrast. Multiplanar CT image reconstructions and MIPs were obtained to evaluate the vascular anatomy. Carotid stenosis measurements (when applicable) are obtained utilizing NASCET criteria, using the distal internal carotid diameter as the denominator. CONTRAST:  32mL OMNIPAQUE IOHEXOL 350 MG/ML SOLN COMPARISON:  MRI head 04/26/2006 FINDINGS: CT HEAD FINDINGS Brain: Ventricle size normal. Hypodensity right frontal white matter compatible with infarct of indeterminate age. Not present previously. Scattered small white matter hypodensities bilaterally have progressed. Hypodensity in the left basal ganglia compatible with chronic infarct. Negative for hemorrhage or mass. Ventricle size normal. Vascular: Negative for hyperdense vessel Skull: Negative Sinuses: Negative Orbits: Negative Review of the MIP images confirms the above findings CTA NECK FINDINGS Aortic arch: Atherosclerotic aortic arch without aneurysm. Proximal great vessels patent with diffuse atherosclerotic calcification Right carotid system: Atherosclerotic calcification right common carotid artery. Focal moderate stenosis right mid common carotid artery. Atherosclerotic disease right carotid bifurcation with severe stenosis at the origin of the right external carotid artery. Mild stenosis proximal right internal carotid artery. Mild to moderate stenosis distal right internal carotid artery. Left carotid system: Mild atherosclerotic calcification left carotid bifurcation. Mild stenosis proximal left internal carotid artery with scattered calcification in the mid and distal left internal carotid artery. Mild stenosis at the origin of the left external carotid artery. Vertebral arteries: Both vertebral arteries patent to the basilar without stenosis Skeleton: Cervical spondylosis. No acute skeletal abnormality. Other neck: Negative for mass or  adenopathy in the neck. Upper chest: Mild apical emphysema. No acute abnormality. Lung apices clear bilaterally. Review of the MIP images confirms the above findings CTA HEAD FINDINGS Anterior circulation: Atherosclerotic calcification in the cavernous carotid bilaterally with mild stenosis bilaterally. Anterior and middle cerebral arteries patent bilaterally without stenosis or large vessel occlusion. Posterior circulation: Both vertebral arteries patent to the basilar. PICA patent bilaterally. Basilar widely patent. AICA, superior cerebellar, posterior cerebral arteries patent bilaterally. Patent posterior communicating artery on the right. Venous sinuses: Normal venous enhancement Anatomic variants: None Review of the MIP images confirms the above findings IMPRESSION: 1. Hypodensity right frontal white matter compatible with acute infarct of indeterminate age. Chronic infarct left basal ganglia. 2. Diffuse atherosclerotic disease in the carotid bifurcation bilaterally. Mild stenosis proximal internal carotid artery bilaterally. Severe stenosis origin of right external carotid artery. Mild to moderate stenosis distal right internal carotid artery. 3. Mild atherosclerotic calcification and stenosis proximal left internal carotid artery. 4. Mild stenosis cavernous carotid bilaterally. No significant intracranial stenosis. 5. Negative for intracranial large vessel occlusion. Aortic Atherosclerosis (ICD10-I70.0). Electronically Signed   By: Franchot Gallo M.D.   On: 03/25/2019 11:17   CT Angio Neck W and/or Wo Contrast  Result Date: 03/25/2019 CLINICAL DATA:  Dizziness nausea EXAM: CT ANGIOGRAPHY HEAD AND NECK TECHNIQUE: Multidetector CT imaging of the head and neck was performed using the standard protocol during bolus administration of intravenous contrast. Multiplanar CT image reconstructions and MIPs were obtained to evaluate the vascular anatomy. Carotid stenosis measurements (when applicable) are obtained  utilizing NASCET criteria, using the distal internal carotid diameter as the denominator. CONTRAST:  12mL OMNIPAQUE IOHEXOL 350 MG/ML SOLN COMPARISON:  MRI head 04/26/2006 FINDINGS: CT HEAD FINDINGS Brain: Ventricle size normal. Hypodensity right frontal white matter compatible with infarct of indeterminate age. Not present previously. Scattered small white matter hypodensities bilaterally have progressed. Hypodensity in the left basal ganglia  compatible with chronic infarct. Negative for hemorrhage or mass. Ventricle size normal. Vascular: Negative for hyperdense vessel Skull: Negative Sinuses: Negative Orbits: Negative Review of the MIP images confirms the above findings CTA NECK FINDINGS Aortic arch: Atherosclerotic aortic arch without aneurysm. Proximal great vessels patent with diffuse atherosclerotic calcification Right carotid system: Atherosclerotic calcification right common carotid artery. Focal moderate stenosis right mid common carotid artery. Atherosclerotic disease right carotid bifurcation with severe stenosis at the origin of the right external carotid artery. Mild stenosis proximal right internal carotid artery. Mild to moderate stenosis distal right internal carotid artery. Left carotid system: Mild atherosclerotic calcification left carotid bifurcation. Mild stenosis proximal left internal carotid artery with scattered calcification in the mid and distal left internal carotid artery. Mild stenosis at the origin of the left external carotid artery. Vertebral arteries: Both vertebral arteries patent to the basilar without stenosis Skeleton: Cervical spondylosis. No acute skeletal abnormality. Other neck: Negative for mass or adenopathy in the neck. Upper chest: Mild apical emphysema. No acute abnormality. Lung apices clear bilaterally. Review of the MIP images confirms the above findings CTA HEAD FINDINGS Anterior circulation: Atherosclerotic calcification in the cavernous carotid bilaterally with  mild stenosis bilaterally. Anterior and middle cerebral arteries patent bilaterally without stenosis or large vessel occlusion. Posterior circulation: Both vertebral arteries patent to the basilar. PICA patent bilaterally. Basilar widely patent. AICA, superior cerebellar, posterior cerebral arteries patent bilaterally. Patent posterior communicating artery on the right. Venous sinuses: Normal venous enhancement Anatomic variants: None Review of the MIP images confirms the above findings IMPRESSION: 1. Hypodensity right frontal white matter compatible with acute infarct of indeterminate age. Chronic infarct left basal ganglia. 2. Diffuse atherosclerotic disease in the carotid bifurcation bilaterally. Mild stenosis proximal internal carotid artery bilaterally. Severe stenosis origin of right external carotid artery. Mild to moderate stenosis distal right internal carotid artery. 3. Mild atherosclerotic calcification and stenosis proximal left internal carotid artery. 4. Mild stenosis cavernous carotid bilaterally. No significant intracranial stenosis. 5. Negative for intracranial large vessel occlusion. Aortic Atherosclerosis (ICD10-I70.0). Electronically Signed   By: Franchot Gallo M.D.   On: 03/25/2019 11:17   MR BRAIN WO CONTRAST  Result Date: 03/25/2019 CLINICAL DATA:  Dizziness. EXAM: MRI HEAD WITHOUT CONTRAST TECHNIQUE: Multiplanar, multiecho pulse sequences of the brain and surrounding structures were obtained without intravenous contrast. COMPARISON:  Head CT/CTA 03/25/2019 and MRI 04/26/2006 FINDINGS: The study is mildly motion degraded. Brain: There is no evidence of acute infarct, intracranial hemorrhage, mass, midline shift, or extra-axial fluid collection. Patchy T2 hyperintensities in the cerebral white matter and pons have substantially progressed from the prior MRI and are nonspecific but compatible with moderate chronic small vessel ischemic disease. The focal hypodensity in the right frontal  white matter on today's CT corresponds to an area of chronic ischemic gliosis. Mild cerebral atrophy is within normal limits for age. Dilated perivascular spaces are noted inferiorly in the basal ganglia, more prominent on the left. Vascular: Major intracranial vascular flow voids are preserved. Skull and upper cervical spine: No suspicious marrow lesion. Moderate to advanced disc degeneration at C3-4 and C4-5 without gross compressive spinal stenosis. Sinuses/Orbits: Unremarkable orbits. Paranasal sinuses and mastoid air cells are clear. Other: None. IMPRESSION: 1. No acute intracranial abnormality. 2. Moderate chronic small vessel ischemic disease. Electronically Signed   By: Logan Bores M.D.   On: 03/25/2019 14:26    Procedures Procedures (including critical care time)  Medications Ordered in ED Medications  sodium chloride (PF) 0.9 % injection (has no administration in  time range)  sodium chloride 0.9 % bolus 1,000 mL (0 mLs Intravenous Stopped 03/25/19 1103)  meclizine (ANTIVERT) tablet 25 mg (25 mg Oral Given 03/25/19 0941)  iohexol (OMNIPAQUE) 350 MG/ML injection 80 mL (80 mLs Intravenous Contrast Given 03/25/19 1041)  LORazepam (ATIVAN) injection 1 mg (1 mg Intravenous Given 03/25/19 1309)  meclizine (ANTIVERT) tablet 25 mg (25 mg Oral Given 03/25/19 1455)    ED Course  I have reviewed the triage vital signs and the nursing notes.  Pertinent labs & imaging results that were available during my care of the patient were reviewed by me and considered in my medical decision making (see chart for details).    MDM Rules/Calculators/A&P                      74 y.o. F with PMH/o arrhythmia, cerebral vascular disease, elevated cholesterol, GERD, carotid endarterectomy who presents with dizziness and nausea that began this morning. She also has this pulsatile tinnitus over the last two weeks. Patient is afebrile, non-toxic appearing, sitting comfortably on examination table. Vital signs reviewed  and stable.  On exam, she has horizontal nystagmus with some slight gait ataxia. Concern for vertigo but also given her history concern for CVA and carotid artery dissection. Plan for labs and imaging.   BMP is unremarkable.  CBC shows no leukocytosis or anemia.  UA negative for any infectious etiology.  Reevaluation.  Patient reports improvement in symptoms after meclizine.  CT mentions hypodensity in the right frontal white matter compatible with acute infarct of undetermined age.  Chronic left infarct of the basal ganglia.  She has atherosclerotic disease noted in the carotid bifurcation bilaterally.  No evidence of dissection.  Given concerning findings, will proceed with MRI to ensure no other acute stroke that would be causing her symptoms.  MRI brain shows no acute intracranial normality.  There is moderate chronic small vessel ischemic disease.  I discussed results with patient.  She reports feeling much better after medications.  I personally ambulated patient in the ED and patient did not have any dizziness or signs of gait ataxia while walking.  Patient reporting that she felt much better.  Plan to treat as vertigo with meclizine. At this time, patient exhibits no emergent life-threatening condition that require further evaluation in ED or admission. Discussed patient with Dr. Rex Kras who is agreeable to plan. Patient had ample opportunity for questions and discussion. All patient's questions were answered with full understanding. Strict return precautions discussed. Patient expresses understanding and agreement to plan.    Portions of this note were generated with [employer]. Dictation errors may occur despite best attempts at proofreading.  Final Clinical Impression(s) / ED Diagnoses Final diagnoses:  Vertigo    Rx / DC Orders ED Discharge Orders         Ordered    meclizine (ANTIVERT) 25 MG tablet  3 times daily PRN     03/25/19 1521           Volanda Napoleon, PA-C 03/25/19 1529    [place], Wenda Overland, MD 03/26/19 [place], Wenda Overland, MD 03/26/19 [phone]

## 2019-03-27 NOTE — Telephone Encounter (Signed)
Left a message for the pt to return my call.  

## 2019-03-27 NOTE — Addendum Note (Signed)
Addended by: Agnes Lawrence on: 03/27/2019 04:30 PM   Modules accepted: Orders

## 2019-03-28 ENCOUNTER — Telehealth: Payer: Self-pay | Admitting: Family Medicine

## 2019-03-28 NOTE — Telephone Encounter (Signed)
Pt was told by ER discharge papers that she needs to see ENT specialist. She called to set the appt and they told her she had to be referred by her pcp. She would like Dr. Ethlyn Gallery to refer her to know she see fit for Ear Nose & Throat.

## 2019-03-28 NOTE — Telephone Encounter (Signed)
Okay for referral?

## 2019-03-29 ENCOUNTER — Other Ambulatory Visit: Payer: Self-pay | Admitting: Family Medicine

## 2019-03-29 DIAGNOSIS — R42 Dizziness and giddiness: Secondary | ICD-10-CM

## 2019-03-29 NOTE — Telephone Encounter (Signed)
Patient informed of the message below.

## 2019-03-29 NOTE — Telephone Encounter (Signed)
Referral has been placed. 

## 2019-04-05 ENCOUNTER — Encounter: Payer: Self-pay | Admitting: Nurse Practitioner

## 2019-04-05 ENCOUNTER — Ambulatory Visit: Payer: Medicare PPO | Admitting: Nurse Practitioner

## 2019-04-05 VITALS — BP 132/70 | HR 76 | Temp 98.2°F | Ht 60.0 in | Wt 145.5 lb

## 2019-04-05 DIAGNOSIS — R1013 Epigastric pain: Secondary | ICD-10-CM | POA: Diagnosis not present

## 2019-04-05 MED ORDER — OMEPRAZOLE 40 MG PO CPDR: 40.0000 mg | DELAYED_RELEASE_CAPSULE | Freq: Every day | ORAL | 3 refills | Status: DC

## 2019-04-05 NOTE — Progress Notes (Signed)
IMPRESSION and PLAN:    [name] [name] is a 74 y.o. female with a pmh significant for, not necessarily limited to cerebrovascular disease, carotid artery disease s/p carotid enterectomy on chronic Plavix,  HTN, OA,    # Chronic postprandial epigastric pain radiating through to back --EGD and abdominal ultrasound July 2020 for this pain were normal.  --In the past Nexium and Omeprazole helped. Insurance wouldn't pay for Nexium. I am unable to figure out why she didn't continue Omeprazole.  --PCP prescribed Dexilant a few days ago. Patient feels it is too soon to know if working but it is cost-prohibitive for her.  --Recommend she complete remaining prescription of Dexilant. Following that she will resume Omeprazole 40 mg qam.  I would start the omeprazole now but doubt insurance would pay for it given she just bought Dexilant last week -If symptoms persist after resuming Omeprazole, patient will call office for further work-up.   # hx of cerebrovascular disease. -- In ED last month with dizziness. Head CT scan showed hypodensity right frontal white matter compatible with acute infarct of indeterminate age. However brain MRI negative for acute infarct. On Plavix.        HPI:    Primary GI: Dr. Henrene Pastor  Chief complaint : upper abdominal pain  **History comes from the chart and patient Patient is a 74 year old female who I saw June 2020 for evaluation of epigastric pain and history of PUD.  Labs were unrevealing.  She was scheduled for upper endoscopy which was done in July 2020 and normal.  Following that we sent her for an abdominal ultrasound which was unrevealing.  We received a message from patient's PCP 03/19/2018 asking for recommendations regarding persistent epigastric pain.  Patient was worked in for this appointment today.  The epigastric pain is mainly postprandial, it does not matter what type of food she eats.  The pain radiates through to her back.  Walking back and  forth can help the pain.  No associated nausea, vomiting or weight loss.  Patient says that Nexium helped the epigastric pain but insurance would not not pay for it.  She was started on omeprazole which also helped the pain.  Patient is unclear why Meprazole was discontinued.  Over the summer see keep prescribed Carafate.  The pills are very large but patient was able to swallow them by breaking the pills in house.  She completed a full prescription without any improvement in epigastric pain.  Over the last several months she has taken over-the-counter Pepcid.  She took over-the-counter omeprazole and Nexium which helped the pain but got costly for her.  Last week PCP prescribed Dexilant.  It has only been a week so patient cannot say the medication is yet working.  She does know that it is not sustainable since she paid around $168 for the prescription.  Data Reviewed:  In ED for dizziness on 03/25/2019. BMP and CBC unremarkable Imaging of the brain shows acute infarct  03/20/19 CMP unremarkable.   Review of systems:     No chest pain, no SOB, no fevers, no urinary sx   Past Medical History:  Diagnosis Date  . Aorto-iliac atherosclerosis ([place])    last aaa duplex 08-30-2015  >50% stenosis bilateral common iliac arteries and left external iliac artery  . Arrhythmia   . Carotid stenosis, bilateral    cardiologist-- dr Stanford Breed ([place] 08-06-2015)  04-29-2017 followed by pcp/  last duplex 03-08-2017 bilateral  ICA 40-59% post endarterectomy,  right ECA occluded  . Cerebral vascular disease   . Chronic gastritis   . Elevated cholesterol   . Esophageal stricture   . Full dentures   . GERD (gastroesophageal reflux disease)   . Hiatal hernia   . History of aortic valve disorder    per cardiology note dated 08-06-2015 by dr Stanford Breed pt has hx small fibroblastoma aortic valve per echo 2008 /  last echo 08-23-2015 no fibroblastoma noted or stenosis or thickened/ calcification  . History of gastric ulcer  2013  . History of nuclear stress test 06-17-2006   per dr Stanford Breed note dated 08-06-2015 , normal study  . History of transient ischemic attack (TIA) 04/25/2006   severe left ICA stenosis/   04-29-2017 per pt no residual  . HLD (hyperlipidemia)   . HTN (hypertension)   . IBS (irritable bowel syndrome)   . OA (osteoarthritis)   . Pneumonia   . Prolapsed internal hemorrhoids, grade 3   . PVD (peripheral vascular disease) ([place])   . Rectal bleed   . SYMPTOM, ECCHYMOSES, SPONTANEOUS 06/16/2006   Qualifier: Diagnosis of  By: Leanne Chang MD, Bruce    . Wears contact lenses     Patient's surgical history, family medical history, social history, medications and allergies were all reviewed in Epic   Serum creatinine: 0.79 mg/dL 03/25/19 0945 Estimated creatinine clearance: 51.5 mL/min  Current Outpatient Medications  Medication Sig Dispense Refill  . AMBULATORY NON FORMULARY MEDICATION Inject 300 mg into the skin See admin instructions. Inclisiran Sodium - Inject into the skin as directed. Per pt in [place] cardiology research trial Next appt in May 2021    . aspirin EC 81 MG tablet Take 1 tablet (81 mg total) by mouth daily. 90 tablet 3  . buPROPion (WELLBUTRIN XL) 300 MG 24 hr tablet TAKE 1 TABLET BY MOUTH ONCE DAILY, (PATIENT NEEDS TO CONTACT OFFICE FOR ADDITIONAL REFILLS). (Patient taking differently: Take 300 mg by mouth at bedtime. ) 90 tablet 0  . Cholecalciferol (VITAMIN D3) 25 MCG (1000 UT) CAPS Take 1 capsule by mouth daily.    . clopidogrel (PLAVIX) 75 MG tablet Take 1 tablet by mouth once daily (Patient taking differently: Take 75 mg by mouth daily. ) 90 tablet 1  . Dexlansoprazole (DEXILANT) 30 MG capsule Take 1 capsule (30 mg total) by mouth daily. 90 capsule 1  . lisinopril (ZESTRIL) 10 MG tablet Take 1 tablet by mouth daily (Patient taking differently: Take 10 mg by mouth daily. ) 90 tablet 0  . meclizine (ANTIVERT) 25 MG tablet Take 1 tablet (25 mg total) by mouth 3  (three) times daily as needed for dizziness. 30 tablet 0  . metoprolol succinate (TOPROL-XL) 50 MG 24 hr tablet TAKE 1 TABLET BY MOUTH ONCE DAILY. TAKE WITH OR IMMEDIATELY FOLLOWING A MEAL. (Patient taking differently: Take 50 mg by mouth daily. ) 90 tablet 0  . traZODone (DESYREL) 50 MG tablet TAKE 1/2 TO 1 (ONE-HALF TO ONE) TABLET BY MOUTH AT BEDTIME AS NEEDED (Patient taking differently: Take 25-50 mg by mouth at bedtime as needed for sleep. TAKE 1/2 TO 1 (ONE-HALF TO ONE) TABLET BY MOUTH AT BEDTIME AS NEEDED) 90 tablet 1  . vitamin B-12 (CYANOCOBALAMIN) 1000 MCG tablet Take 1,000 mcg by mouth daily.     No current facility-administered medications for this visit.    Physical Exam:     BP 132/70 (BP Location: Left Arm, Patient Position: Sitting, Cuff Size: Normal)   Pulse  76   Temp 98.2 F (36.8 C)   Ht 5' (1.524 m) Comment: height measured without shoes  Wt 145 lb 8 oz (66 kg)   BMI 28.42 kg/m   GENERAL:  Pleasant female in NAD PSYCH: : Cooperative, normal affect CARDIAC:  RRR,  no peripheral edema PULM: Normal respiratory effort, lungs CTA bilaterally, no wheezing ABDOMEN:  Nondistended, soft, nontender. No obvious masses, no hepatomegaly,  normal bowel sounds SKIN:  turgor, no lesions seen Musculoskeletal:  Normal muscle tone, normal strength NEURO: Alert and oriented x 3, no focal neurologic deficits   Tye Savoy , NP 04/05/2019, 10:17 AM

## 2019-04-05 NOTE — Patient Instructions (Signed)
If you are age 75 or older, your body mass index should be between 23-30. Your Body mass index is 28.42 kg/m. If this is out of the aforementioned range listed, please consider follow up with your Primary Care Provider.  If you are age 63 or younger, your body mass index should be between 19-25. Your Body mass index is 28.42 kg/m. If this is out of the aformentioned range listed, please consider follow up with your Primary Care Provider.   You have been given a prescription for Omeprazole 40 mg every morning after completion of Dexilant.  Follow up as needed.  Thank you for choosing me and [facility].   Tye Savoy, NP

## 2019-04-06 ENCOUNTER — Ambulatory Visit (INDEPENDENT_AMBULATORY_CARE_PROVIDER_SITE_OTHER): Payer: Medicare PPO | Admitting: Otolaryngology

## 2019-04-06 ENCOUNTER — Other Ambulatory Visit: Payer: Self-pay

## 2019-04-06 ENCOUNTER — Encounter: Payer: Self-pay | Admitting: Nurse Practitioner

## 2019-04-06 ENCOUNTER — Encounter (INDEPENDENT_AMBULATORY_CARE_PROVIDER_SITE_OTHER): Payer: Self-pay | Admitting: Otolaryngology

## 2019-04-06 VITALS — Temp 97.7°F

## 2019-04-06 DIAGNOSIS — H93A3 Pulsatile tinnitus, bilateral: Secondary | ICD-10-CM | POA: Diagnosis not present

## 2019-04-06 DIAGNOSIS — H903 Sensorineural hearing loss, bilateral: Secondary | ICD-10-CM

## 2019-04-06 NOTE — Progress Notes (Signed)
Assessment and plan reviewed 

## 2019-04-06 NOTE — Progress Notes (Signed)
HPI: [name] [name] is a 74 y.o. female who presents for evaluation of hearing her heartbeat in in both her ears which is very loud and makes it difficult for her to sleep.  She also had episode of dizziness last month and had an MRI scan of her head which was normal.  The dizziness is doing better and she presents today mostly because of hearing her heartbeat beat in her ears on both sides.  Past Medical History:  Diagnosis Date  . Aorto-iliac atherosclerosis ([place])    last aaa duplex 08-30-2015  >50% stenosis bilateral common iliac arteries and left external iliac artery  . Arrhythmia   . Carotid stenosis, bilateral    cardiologist-- dr Stanford Breed ([place] 08-06-2015)  04-29-2017 followed by pcp/  last duplex 03-08-2017 bilateral ICA 40-59% post endarterectomy,  right ECA occluded  . Cerebral vascular disease   . Chronic gastritis   . Elevated cholesterol   . Esophageal stricture   . Full dentures   . GERD (gastroesophageal reflux disease)   . Hiatal hernia   . History of aortic valve disorder    per cardiology note dated 08-06-2015 by dr Stanford Breed pt has hx small fibroblastoma aortic valve per echo 2008 /  last echo 08-23-2015 no fibroblastoma noted or stenosis or thickened/ calcification  . History of gastric ulcer 2013  . History of nuclear stress test 06-17-2006   per dr Stanford Breed note dated 08-06-2015 , normal study  . History of transient ischemic attack (TIA) 04/25/2006   severe left ICA stenosis/   04-29-2017 per pt no residual  . HLD (hyperlipidemia)   . HTN (hypertension)   . IBS (irritable bowel syndrome)   . OA (osteoarthritis)   . Pneumonia   . Prolapsed internal hemorrhoids, grade 3   . PVD (peripheral vascular disease) ([place])   . Rectal bleed   . SYMPTOM, ECCHYMOSES, SPONTANEOUS 06/16/2006   Qualifier: Diagnosis of  By: Leanne Chang MD, Bruce    . Wears contact lenses    Past Surgical History:  Procedure Laterality Date  . CAROTID ENDARTERECTOMY Bilateral left 04-29-2006 and right  08-17-2007 by  dr c. Scot Dock  [facility]  . HEMORRHOID SURGERY N/A 05/07/2017   Procedure: ANAL EXAM UNDER ANESTHESIA, HEMORRHOIDECTOMY;  Surgeon: Leighton Ruff, MD;  Location: [facility];  Service: General;  Laterality: N/A;  . NASAL SINUS SURGERY  age 60  . TONSILLECTOMY  age 56  . TRANSTHORACIC ECHOCARDIOGRAM  08/23/2015   ef [id number], grade 1 diastolic dysfunction/ mild AR and MR  . VASCULAR SURGERY Bilateral    carotids  . WRIST GANGLION EXCISION Right yrs ago   Social History   Socioeconomic History  . Marital status: Widowed    Spouse name: Not on file  . Number of children: 0  . Years of education: 54  . Highest education level: Associate degree: occupational, Hotel manager, or vocational program  Occupational History  . Occupation: [employer]    Comment: retired  Tobacco Use  . Smoking status: Former Smoker    Years: 30.00    Types: Cigarettes    Quit date: 04/02/2012    Years since quitting: 7.0  . Smokeless tobacco: Never Used  Substance and Sexual Activity  . Alcohol use: No  . Drug use: No  . Sexual activity: Not on file  Other Topics Concern  . Not on file  Social History Narrative    widow   Worked for [employer].       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 Difficulty of Paying Living Expenses: Not hard at all  Food Insecurity: No Food Insecurity  . Worried About Charity fundraiser in the Last Year: Never true  . Ran Out of Food in the Last Year: Never true  Transportation Needs: No Transportation Needs  . Lack of Transportation (Medical): No  . Lack of Transportation (Non-Medical): No  Physical Activity:  Sufficiently Active  . Days of Exercise per Week: 7 days  . Minutes of Exercise per Session: 40 min  Stress: No Stress Concern Present  . Feeling of Stress : Not at all  Social Connections: Slightly Isolated  . Frequency of Communication with Friends and Family: More than three times a week  . Frequency of Social Gatherings with Friends and Family: Not on file  . Attends Religious Services: More than 4 times per year  . Active Member of Clubs or Organizations: Yes  . Attends Archivist Meetings: Not on file  . Marital Status: Widowed   Family History  Problem Relation Age of Onset  . Stroke Father   . Diabetes Mother   . Asthma Mother   . Heart disease Brother   . Diabetes Other   . Breast cancer Other   . Rheum arthritis Sister   . Colon cancer Neg Hx    Allergies  Allergen Reactions  . Sulfa Antibiotics Swelling   Prior to Admission medications   Medication Sig Start Date End Date Taking? Authorizing Provider  AMBULATORY NON FORMULARY MEDICATION Inject 300 mg into the skin See admin instructions. Inclisiran Sodium - Inject into the skin as directed. Per pt in [place] cardiology research trial Next appt in May 2021   Yes [provider]  aspirin EC 81 MG tablet Take 1 tablet (81 mg total) by mouth daily. 06/15/17  Yes Lelon Perla, MD  buPROPion (WELLBUTRIN XL) 300 MG 24 hr tablet TAKE 1 TABLET BY MOUTH ONCE DAILY, (PATIENT NEEDS TO CONTACT OFFICE FOR ADDITIONAL REFILLS). Patient taking differently: Take 300 mg by mouth at bedtime.  02/13/19  Yes Koberlein, Steele Berg, MD  Cholecalciferol (VITAMIN D3) 25 MCG (1000 UT) CAPS Take 1 capsule by mouth daily.   Yes [provider]  clopidogrel (PLAVIX) 75 MG tablet Take 1 tablet by mouth once daily Patient taking differently: Take 75 mg by mouth daily.  01/17/19  Yes Koberlein, Steele Berg, MD  Dexlansoprazole (DEXILANT) 30 MG capsule Take 1 capsule (30 mg total) by mouth daily. 03/17/19  Yes  Koberlein, Junell C, MD  lisinopril (ZESTRIL) 10 MG tablet Take 1 tablet by mouth daily Patient taking differently: Take 10 mg by mouth daily.  03/14/19  Yes Koberlein, Steele Berg, MD  meclizine (ANTIVERT) 25 MG tablet Take 1 tablet (25 mg total) by mouth 3 (three) times daily as needed for dizziness. 03/25/19  Yes Providence Lanius A, PA-C  metoprolol succinate (TOPROL-XL) 50 MG 24 hr tablet TAKE 1 TABLET BY MOUTH ONCE DAILY. TAKE WITH OR IMMEDIATELY FOLLOWING A MEAL. Patient taking differently: Take 50 mg by mouth daily.  01/16/19  Yes Koberlein, Steele Berg, MD  omeprazole (PRILOSEC) 40 MG capsule Take 1 capsule (40 mg total) by  mouth daily. 04/05/19  Yes Willia Craze, NP  traZODone (DESYREL) 50 MG tablet TAKE 1/2 TO 1 (ONE-HALF TO ONE) TABLET BY MOUTH AT BEDTIME AS NEEDED Patient taking differently: Take 25-50 mg by mouth at bedtime as needed for sleep. TAKE 1/2 TO 1 (ONE-HALF TO ONE) TABLET BY MOUTH AT BEDTIME AS NEEDED 10/19/18  Yes Koberlein, Junell C, MD  vitamin B-12 (CYANOCOBALAMIN) 1000 MCG tablet Take 1,000 mcg by mouth daily.   Yes [provider]     Positive ROS: Otherwise negative  All other systems have been reviewed and were otherwise negative with the exception of those mentioned in the HPI and as above.  Physical Exam: Constitutional: Alert, well-appearing, no acute distress Ears: External ears without lesions or tenderness. Ear canals are clear bilaterally with intact, clear TMs bilaterally with normal mobility on pneumatic otoscopy and no middle ear effusion noted.  Auscultation of the ears reveals no objective pulsatile tinnitus.  On tuning fork testing she has mild diminished hearing bilaterally with AC > BC bilaterally. Nasal: External nose without lesions. Septum slightly deviated to the right with mild rhinitis.. Clear nasal passages otherwise. Oral: Lips and gums without lesions. Tongue and palate mucosa without lesions. Posterior oropharynx clear. Neck: No  palpable adenopathy or masses.  Auscultation of the neck reveals loud bilateral carotid bruits. Respiratory: Breathing comfortably  Skin: No facial/neck lesions or rash noted.  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  Plan: We will plan on obtaining audiogram to evaluate his degree of hearing loss. Concerning the pulsatile tinnitus this is secondary to loud carotid bruits bilaterally.  Would recommend addressing this with cardiothoracic team which is aware of her carotid stenosis. Discussed with her concerning using masking noise to help with control of the pulsatile tinnitus which she hears. She will follow up here following audiologic testing.   Radene Journey, MD

## 2019-04-19 ENCOUNTER — Telehealth: Payer: Self-pay | Admitting: Family Medicine

## 2019-04-19 ENCOUNTER — Other Ambulatory Visit: Payer: Self-pay | Admitting: Family Medicine

## 2019-04-19 MED ORDER — MECLIZINE HCL 25 MG PO TABS: 25.0000 mg | ORAL_TABLET | Freq: Three times a day (TID) | ORAL | 2 refills | Status: DC | PRN

## 2019-04-19 NOTE — Telephone Encounter (Signed)
Spoke with the pt and informed her of the message below.   

## 2019-04-19 NOTE — Telephone Encounter (Signed)
Pt said she was in the ER all day 2/27 she had a small stoke on the front of her brain along with a large case of vertigo. She is now out of the medication that was prescribed that helped her. She is not experiencing the symptoms again not as bad as before. She is wanting to know if the prescription can be refilled?  Medication:MECLIZINE 25mg  tablet cad Pharmacy: [facility]

## 2019-04-19 NOTE — Telephone Encounter (Signed)
I was able to review the MRI which was stable and I did go ahead and send in a refill of meclizine. If symptoms do worsen, then she needs to be seen.

## 2019-04-28 ENCOUNTER — Other Ambulatory Visit: Payer: Self-pay | Admitting: Family Medicine

## 2019-05-29 ENCOUNTER — Other Ambulatory Visit: Payer: Self-pay | Admitting: Family Medicine

## 2019-06-02 NOTE — Progress Notes (Signed)
Office Visit Note  Patient: [name] [name]             Date of Birth: [birth year]           MRN: [id number]             PCP: Caren Macadam, MD Referring: Caren Macadam, MD Visit Date: 06/13/2019 Occupation: '@GUAROCC'$ @  Subjective:  New Patient (Initial Visit) (Bil hand pain, bil knee pain)   History of Present Illness: [name] [name] is a 74 y.o. female seen in consultation per request of her PCP.  According to patient in 2011 she started having difficulty climbing the stairs and she had to retire.  She states since then she has had pain in her knee joints off and on.  She also has been having discomfort in her bilateral hands for the last couple of years.  She has noticed occasional swelling in her hands.  None of the other joints are painful.  She had some recent labs which were positive for ANA for that reason she was referred to me.  She denies any history of oral ulcers, nasal ulcers, malar rash, photosensitivity, Raynaud's phenomenon, lymphadenopathy.  There is no family history of autoimmune disease.  Activities of Daily Living:  Patient reports morning stiffness for 1 hour.   Patient Denies nocturnal pain.  Difficulty dressing/grooming: Denies Difficulty climbing stairs: Denies Difficulty getting out of chair: Reports Difficulty using hands for taps, buttons, cutlery, and/or writing: Reports  Review of Systems  Constitutional: Negative for fatigue, night sweats, weight gain and weight loss.  HENT: Negative for mouth sores, trouble swallowing, trouble swallowing, mouth dryness and nose dryness.   Eyes: Negative for pain, redness, visual disturbance and dryness.  Respiratory: Negative for cough, shortness of breath and difficulty breathing.   Cardiovascular: Negative for chest pain, palpitations, hypertension, irregular heartbeat and swelling in legs/feet.  Gastrointestinal: Positive for heartburn. Negative for blood in stool, constipation and diarrhea.  Endocrine:  Negative for excessive thirst and increased urination.  Genitourinary: Negative for difficulty urinating and vaginal dryness.  Musculoskeletal: Positive for arthralgias, gait problem, joint pain, joint swelling and morning stiffness. Negative for myalgias, muscle weakness, muscle tenderness and myalgias.  Skin: Negative for color change, rash, hair loss, skin tightness, ulcers and sensitivity to sunlight.  Allergic/Immunologic: Negative for susceptible to infections.  Neurological: Positive for dizziness and numbness. Negative for light-headedness, memory loss, night sweats and weakness.  Hematological: Negative for swollen glands.  Psychiatric/Behavioral: Negative for depressed mood and sleep disturbance. The patient is not nervous/anxious.     PMFS History:  Patient Active Problem List   Diagnosis Date Noted  . 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 HYPERCHOLESTEROLEMIA 11/16/2008  . Osteoarthritis 11/16/2008  . Hyperlipemia 06/16/2006  . Essential hypertension 06/16/2006  . Cerebrovascular disease 06/16/2006  . GERD 06/16/2006    Past Medical History:  Diagnosis Date  . Aorto-iliac atherosclerosis ([place])    last aaa duplex 08-30-2015  >50% stenosis bilateral common iliac arteries and left external iliac artery  . Arrhythmia   . Carotid stenosis, bilateral    cardiologist-- dr Stanford Breed ([place] 08-06-2015)  04-29-2017 followed by pcp/  last duplex 03-08-2017 bilateral ICA 40-59% post endarterectomy,  right ECA occluded  . Cerebral vascular disease   . Chronic gastritis   . Elevated cholesterol   . Esophageal stricture   . Full dentures   . GERD (gastroesophageal reflux disease)   . Hiatal hernia   . History of aortic valve disorder    per  cardiology note dated 08-06-2015 by dr Stanford Breed pt has hx small fibroblastoma aortic valve per echo 2008 /  last echo 08-23-2015 no fibroblastoma noted or stenosis or thickened/ calcification  . History of gastric ulcer 2013  . History of nuclear stress test  06-17-2006   per dr Stanford Breed note dated 08-06-2015 , normal study  . History of transient ischemic attack (TIA) 04/25/2006   severe left ICA stenosis/   04-29-2017 per pt no residual  . HLD (hyperlipidemia)   . HTN (hypertension)   . IBS (irritable bowel syndrome)   . OA (osteoarthritis)   . Pneumonia   . Prolapsed internal hemorrhoids, grade 3   . PVD (peripheral vascular disease) ([place])   . Rectal bleed   . SYMPTOM, ECCHYMOSES, SPONTANEOUS 06/16/2006   Qualifier: Diagnosis of  By: Leanne Chang MD, Bruce    . Vertigo   . Wears contact lenses     Family History  Problem Relation Age of Onset  . Stroke Father   . Diabetes Mother   . Asthma Mother   . Heart disease Brother   . Diabetes Other   . Breast cancer Other   . Rheum arthritis Sister   . Colon cancer Neg Hx    Past Surgical History:  Procedure Laterality Date  . CAROTID ENDARTERECTOMY Bilateral left 04-29-2006 and right 08-17-2007 by  dr c. Scot Dock  [facility]  . HEMORRHOID SURGERY N/A 05/07/2017   Procedure: ANAL EXAM UNDER ANESTHESIA, HEMORRHOIDECTOMY;  Surgeon: Leighton Ruff, MD;  Location: [facility];  Service: General;  Laterality: N/A;  . NASAL SINUS SURGERY  age 78  . TONSILLECTOMY  age 30  . TRANSTHORACIC ECHOCARDIOGRAM  08/23/2015   ef 19-50%, grade 1 diastolic dysfunction/ mild AR and MR  . VASCULAR SURGERY Bilateral    carotids  . WRIST GANGLION EXCISION Right yrs ago   Social History   Social History Narrative    widow   Worked for [employer].       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[employer], substitute for [place] important part of her life   Has three living siblings, local, good support Psychologist, occupational at nursing facilities with hospice   Immunization History  Administered Date(s) Administered  . Influenza-Unspecified 01/26/2018  . Pneumococcal Conjugate-13 12/11/2013, 03/02/2018  . Pneumococcal  Polysaccharide-23 01/24/2015  . Tdap 01/26/2009  . Zoster Recombinat (Shingrix) 03/02/2018, 09/02/2018     Objective: Vital Signs: BP 128/64 (BP Location: Right Arm, Patient Position: Sitting, Cuff Size: Normal)   Pulse 72   Resp 18   Ht 5' 0.5" (1.537 m)   Wt 148 lb (67.1 kg)   BMI 28.43 kg/m    Physical Exam Vitals and nursing note reviewed.  Constitutional:      Appearance: She is well-developed.  HENT:     Head: Normocephalic and atraumatic.  Eyes:     Conjunctiva/sclera: Conjunctivae normal.  Cardiovascular:     Rate and Rhythm: Normal rate and regular rhythm.     Heart sounds: Normal heart sounds.  Pulmonary:     Effort: Pulmonary effort is normal.     Breath sounds: Normal breath sounds.  Abdominal:     General: Bowel sounds are normal.     Palpations: Abdomen is soft.  Musculoskeletal:     Cervical back: Normal range of motion.  Lymphadenopathy:     Cervical: No cervical adenopathy.  Skin:    General: Skin is warm  and dry.     Capillary Refill: Capillary refill takes less than 2 seconds.  Neurological:     Mental Status: She is alert and oriented to person, place, and time.  Psychiatric:        Behavior: Behavior normal.      Musculoskeletal Exam: C-spine thoracic and lumbar spine were in good range of motion with no discomfort.  Shoulder joints, elbow joints, wrist joints with good range of motion.  She has bilateral DIP and PIP thickening with no synovitis.  Hip joints and knee joints in good range of motion.  She has discomfort range of motion of her knee joints without any warmth swelling or effusion.  Ankle joints MTPs PIPs with good range of motion with no synovitis.  CDAI Exam: CDAI Score: -- Patient Global: --; Provider Global: -- Swollen: --; Tender: -- Joint Exam 06/13/2019   No joint exam has been documented for this visit   There is currently no information documented on the homunculus. Go to the Rheumatology activity and complete the  homunculus joint exam.  Investigation: No additional findings.  Imaging: No results found.  Recent Labs: Lab Results  Component Value Date   WBC 5.7 03/25/2019   HGB 13.1 03/25/2019   PLT 205 03/25/2019   NA 138 03/25/2019   K 4.1 03/25/2019   CL 108 03/25/2019   CO2 24 03/25/2019   GLUCOSE 128 (H) 03/25/2019   BUN 18 03/25/2019   CREATININE 0.79 03/25/2019   BILITOT 0.4 03/20/2019   ALKPHOS 94 03/20/2019   AST 21 03/20/2019   ALT 21 03/20/2019   PROT 7.0 03/20/2019   ALBUMIN 4.7 03/20/2019   CALCIUM 8.7 (L) 03/25/2019   GFRAA >60 03/25/2019  9  Speciality Comments: No specialty comments available.  Procedures:  No procedures performed Allergies: Sulfa antibiotics   Assessment / Plan:     Visit Diagnoses: Positive ANA (antinuclear antibody) - 03/20/19: ANA 1:80NS, 1:160 nuclear, nucleolar, RF<14, CRP<1, ESR 21, TSH 1.34, Vitamin B12 792, vitamin D 29.84, CK 57.  Her ANA is low titer and not significant.  She has no clinical features of lupus or any other autoimmune disease.  Pain in both hands -clinical findings are consistent with osteoarthritis with DIP and PIP thickening.  Joint protection muscle strengthening was discussed.  Plan: XR Hand 2 View Right, XR Hand 2 View Left.  X-rays were consistent with osteoarthritis.  A handout on hand exercises was given.  Natural documentaries were discussed.  Chronic pain of both knees -she complains of discomfort in her bilateral knee joints and difficulty climbing stairs.  Plan: XR KNEE 3 VIEW RIGHT, XR KNEE 3 VIEW LEFT.  X-ray showed mild osteoarthritis and mild chondromalacia patella.  Exercises were discussed and a handout was given.  Of advised her to return if her symptoms get worse.   Other medical problems listed as follows:  Cerebrovascular disease - h/o TIAs  Essential hypertension-blood pressure is normal.  History of gastroesophageal reflux (GERD)-she is on Dexilant.  History of  hyperlipidemia  Vertigo  Orders: Orders Placed This Encounter  Procedures  . XR Hand 2 View Right  . XR Hand 2 View Left  . XR KNEE 3 VIEW RIGHT  . XR KNEE 3 VIEW LEFT   No orders of the defined types were placed in this encounter.     Follow-Up Instructions: Return for Osteoarthritis.   Bo Merino, MD  Note - This record has been created using Editor, commissioning.  Chart creation errors have been  sought, but may not always  have been located. Such creation errors do not reflect on  the standard of medical care.

## 2019-06-09 ENCOUNTER — Encounter: Payer: Medicare PPO | Admitting: *Deleted

## 2019-06-09 ENCOUNTER — Other Ambulatory Visit: Payer: Self-pay

## 2019-06-09 VITALS — BP 158/67 | HR 69 | Resp 18 | Wt 146.6 lb

## 2019-06-09 DIAGNOSIS — Z006 Encounter for examination for normal comparison and control in clinical research program: Secondary | ICD-10-CM

## 2019-06-09 NOTE — Research (Signed)
Patient to research clinic for visit 5 day 630 in the [place] research study. All aes or saes to have been reported to sponsor. Injection given into right abdomen and patient remained for 30 minutes for observation. Pt tolerated well. Next clinic visit scheduled.

## 2019-06-11 ENCOUNTER — Other Ambulatory Visit: Payer: Self-pay | Admitting: Family Medicine

## 2019-06-11 DIAGNOSIS — I10 Essential (primary) hypertension: Secondary | ICD-10-CM

## 2019-06-13 ENCOUNTER — Ambulatory Visit: Payer: Self-pay

## 2019-06-13 ENCOUNTER — Encounter: Payer: Self-pay | Admitting: Rheumatology

## 2019-06-13 ENCOUNTER — Other Ambulatory Visit: Payer: Self-pay

## 2019-06-13 ENCOUNTER — Ambulatory Visit: Payer: Medicare PPO | Admitting: Rheumatology

## 2019-06-13 VITALS — BP 128/64 | HR 72 | Resp 18 | Ht 60.5 in | Wt 148.0 lb

## 2019-06-13 DIAGNOSIS — R768 Other specified abnormal immunological findings in serum: Secondary | ICD-10-CM

## 2019-06-13 DIAGNOSIS — I10 Essential (primary) hypertension: Secondary | ICD-10-CM

## 2019-06-13 DIAGNOSIS — M25562 Pain in left knee: Secondary | ICD-10-CM | POA: Diagnosis not present

## 2019-06-13 DIAGNOSIS — M25561 Pain in right knee: Secondary | ICD-10-CM | POA: Diagnosis not present

## 2019-06-13 DIAGNOSIS — Z8639 Personal history of other endocrine, nutritional and metabolic disease: Secondary | ICD-10-CM

## 2019-06-13 DIAGNOSIS — M79642 Pain in left hand: Secondary | ICD-10-CM | POA: Diagnosis not present

## 2019-06-13 DIAGNOSIS — G8929 Other chronic pain: Secondary | ICD-10-CM

## 2019-06-13 DIAGNOSIS — M79641 Pain in right hand: Secondary | ICD-10-CM | POA: Diagnosis not present

## 2019-06-13 DIAGNOSIS — Z8719 Personal history of other diseases of the digestive system: Secondary | ICD-10-CM

## 2019-06-13 DIAGNOSIS — I679 Cerebrovascular disease, unspecified: Secondary | ICD-10-CM | POA: Diagnosis not present

## 2019-06-13 DIAGNOSIS — R42 Dizziness and giddiness: Secondary | ICD-10-CM

## 2019-06-13 NOTE — Patient Instructions (Signed)
Hand Exercises Hand exercises can be helpful for almost anyone. These exercises can strengthen the hands, improve flexibility and movement, and increase blood flow to the hands. These results can make work and daily tasks easier. Hand exercises can be especially helpful for people who have joint pain from arthritis or have nerve damage from overuse (carpal tunnel syndrome). These exercises can also help people who have injured a hand. Exercises Most of these hand exercises are gentle stretching and motion exercises. It is usually safe to do them often throughout the day. Warming up your hands before exercise may help to reduce stiffness. You can do this with gentle massage or by placing your hands in warm water for 10-15 minutes. It is normal to feel some stretching, pulling, tightness, or mild discomfort as you begin new exercises. This will gradually improve. Stop an exercise right away if you feel sudden, severe pain or your pain gets worse. Ask your health care provider which exercises are best for you. Knuckle bend or "claw" fist 1. Stand or sit with your arm, hand, and all five fingers pointed straight up. Make sure to keep your wrist straight during the exercise. 2. Gently bend your fingers down toward your palm until the tips of your fingers are touching the top of your palm. Keep your big knuckle straight and just bend the small knuckles in your fingers. 3. Hold this position for __________ seconds. 4. Straighten (extend) your fingers back to the starting position. Repeat this exercise 5-10 times with each hand. Full finger fist 1. Stand or sit with your arm, hand, and all five fingers pointed straight up. Make sure to keep your wrist straight during the exercise. 2. Gently bend your fingers into your palm until the tips of your fingers are touching the middle of your palm. 3. Hold this position for __________ seconds. 4. Extend your fingers back to the starting position, stretching every  joint fully. Repeat this exercise 5-10 times with each hand. Straight fist 1. Stand or sit with your arm, hand, and all five fingers pointed straight up. Make sure to keep your wrist straight during the exercise. 2. Gently bend your fingers at the big knuckle, where your fingers meet your hand, and the middle knuckle. Keep the knuckle at the tips of your fingers straight and try to touch the bottom of your palm. 3. Hold this position for __________ seconds. 4. Extend your fingers back to the starting position, stretching every joint fully. Repeat this exercise 5-10 times with each hand. Tabletop 1. Stand or sit with your arm, hand, and all five fingers pointed straight up. Make sure to keep your wrist straight during the exercise. 2. Gently bend your fingers at the big knuckle, where your fingers meet your hand, as far down as you can while keeping the small knuckles in your fingers straight. Think of forming a tabletop with your fingers. 3. Hold this position for __________ seconds. 4. Extend your fingers back to the starting position, stretching every joint fully. Repeat this exercise 5-10 times with each hand. Finger spread 1. Place your hand flat on a table with your palm facing down. Make sure your wrist stays straight as you do this exercise. 2. Spread your fingers and thumb apart from each other as far as you can until you feel a gentle stretch. Hold this position for __________ seconds. 3. Bring your fingers and thumb tight together again. Hold this position for __________ seconds. Repeat this exercise 5-10 times with each hand.   Making circles 1. Stand or sit with your arm, hand, and all five fingers pointed straight up. Make sure to keep your wrist straight during the exercise. 2. Make a circle by touching the tip of your thumb to the tip of your index finger. 3. Hold for __________ seconds. Then open your hand wide. 4. Repeat this motion with your thumb and each finger on your  hand. Repeat this exercise 5-10 times with each hand. Thumb motion 1. Sit with your forearm resting on a table and your wrist straight. Your thumb should be facing up toward the ceiling. Keep your fingers relaxed as you move your thumb. 2. Lift your thumb up as high as you can toward the ceiling. Hold for __________ seconds. 3. Bend your thumb across your palm as far as you can, reaching the tip of your thumb for the small finger (pinkie) side of your palm. Hold for __________ seconds. Repeat this exercise 5-10 times with each hand. Grip strengthening  1. Hold a stress ball or other soft ball in the middle of your hand. 2. Slowly increase the pressure, squeezing the ball as much as you can without causing pain. Think of bringing the tips of your fingers into the middle of your palm. All of your finger joints should bend when doing this exercise. 3. Hold your squeeze for __________ seconds, then relax. Repeat this exercise 5-10 times with each hand. Contact a health care provider if:  Your hand pain or discomfort gets much worse when you do an exercise.  Your hand pain or discomfort does not improve within 2 hours after you exercise. If you have any of these problems, stop doing these exercises right away. Do not do them again unless your health care provider says that you can. Get help right away if:  You develop sudden, severe hand pain or swelling. If this happens, stop doing these exercises right away. Do not do them again unless your health care provider says that you can. This information is not intended to replace advice given to you by your health care provider. Make sure you discuss any questions you have with your health care provider. Document Revised: 05/05/2018 Document Reviewed: 01/13/2018 Elsevier Patient Education  2020 Elsevier Inc.  Journal for Nurse Practitioners, 15(4), 263-267. Retrieved November 01, 2017 from http://clinicalkey.com/nursing">  Knee Exercises Ask your  health care provider which exercises are safe for you. Do exercises exactly as told by your health care provider and adjust them as directed. It is normal to feel mild stretching, pulling, tightness, or discomfort as you do these exercises. Stop right away if you feel sudden pain or your pain gets worse. Do not begin these exercises until told by your health care provider. Stretching and range-of-motion exercises These exercises warm up your muscles and joints and improve the movement and flexibility of your knee. These exercises also help to relieve pain and swelling. Knee extension, prone 1. Lie on your abdomen (prone position) on a bed. 2. Place your left / right knee just beyond the edge of the surface so your knee is not on the bed. You can put a towel under your left / right thigh just above your kneecap for comfort. 3. Relax your leg muscles and allow gravity to straighten your knee (extension). You should feel a stretch behind your left / right knee. 4. Hold this position for __________ seconds. 5. Scoot up so your knee is supported between repetitions. Repeat __________ times. Complete this exercise __________ times a day.   Knee flexion, active  1. Lie on your back with both legs straight. If this causes back discomfort, bend your left / right knee so your foot is flat on the floor. 2. Slowly slide your left / right heel back toward your buttocks. Stop when you feel a gentle stretch in the front of your knee or thigh (flexion). 3. Hold this position for __________ seconds. 4. Slowly slide your left / right heel back to the starting position. Repeat __________ times. Complete this exercise __________ times a day. Quadriceps stretch, prone  1. Lie on your abdomen on a firm surface, such as a bed or padded floor. 2. Bend your left / right knee and hold your ankle. If you cannot reach your ankle or pant leg, loop a belt around your foot and grab the belt instead. 3. Gently pull your heel  toward your buttocks. Your knee should not slide out to the side. You should feel a stretch in the front of your thigh and knee (quadriceps). 4. Hold this position for __________ seconds. Repeat __________ times. Complete this exercise __________ times a day. Hamstring, supine 1. Lie on your back (supine position). 2. Loop a belt or towel over the ball of your left / right foot. The ball of your foot is on the walking surface, right under your toes. 3. Straighten your left / right knee and slowly pull on the belt to raise your leg until you feel a gentle stretch behind your knee (hamstring). ? Do not let your knee bend while you do this. ? Keep your other leg flat on the floor. 4. Hold this position for __________ seconds. Repeat __________ times. Complete this exercise __________ times a day. Strengthening exercises These exercises build strength and endurance in your knee. Endurance is the ability to use your muscles for a long time, even after they get tired. Quadriceps, isometric This exercise stretches the muscles in front of your thigh (quadriceps) without moving your knee joint (isometric). 1. Lie on your back with your left / right leg extended and your other knee bent. Put a rolled towel or small pillow under your knee if told by your health care provider. 2. Slowly tense the muscles in the front of your left / right thigh. You should see your kneecap slide up toward your hip or see increased dimpling just above the knee. This motion will push the back of the knee toward the floor. 3. For __________ seconds, hold the muscle as tight as you can without increasing your pain. 4. Relax the muscles slowly and completely. Repeat __________ times. Complete this exercise __________ times a day. Straight leg raises This exercise stretches the muscles in front of your thigh (quadriceps) and the muscles that move your hips (hip flexors). 1. Lie on your back with your left / right leg extended and  your other knee bent. 2. Tense the muscles in the front of your left / right thigh. You should see your kneecap slide up or see increased dimpling just above the knee. Your thigh may even shake a bit. 3. Keep these muscles tight as you raise your leg 4-6 inches (10-15 cm) off the floor. Do not let your knee bend. 4. Hold this position for __________ seconds. 5. Keep these muscles tense as you lower your leg. 6. Relax your muscles slowly and completely after each repetition. Repeat __________ times. Complete this exercise __________ times a day. Hamstring, isometric 1. Lie on your back on a firm surface. 2. Bend your   left / right knee about __________ degrees. 3. Dig your left / right heel into the surface as if you are trying to pull it toward your buttocks. Tighten the muscles in the back of your thighs (hamstring) to "dig" as hard as you can without increasing any pain. 4. Hold this position for __________ seconds. 5. Release the tension gradually and allow your muscles to relax completely for __________ seconds after each repetition. Repeat __________ times. Complete this exercise __________ times a day. Hamstring curls If told by your health care provider, do this exercise while wearing ankle weights. Begin with __________ lb weights. Then increase the weight by 1 lb (0.5 kg) increments. Do not wear ankle weights that are more than __________ lb. 1. Lie on your abdomen with your legs straight. 2. Bend your left / right knee as far as you can without feeling pain. Keep your hips flat against the floor. 3. Hold this position for __________ seconds. 4. Slowly lower your leg to the starting position. Repeat __________ times. Complete this exercise __________ times a day. Squats This exercise strengthens the muscles in front of your thigh and knee (quadriceps). 1. Stand in front of a table, with your feet and knees pointing straight ahead. You may rest your hands on the table for balance but  not for support. 2. Slowly bend your knees and lower your hips like you are going to sit in a chair. ? Keep your weight over your heels, not over your toes. ? Keep your lower legs upright so they are parallel with the table legs. ? Do not let your hips go lower than your knees. ? Do not bend lower than told by your health care provider. ? If your knee pain increases, do not bend as low. 3. Hold the squat position for __________ seconds. 4. Slowly push with your legs to return to standing. Do not use your hands to pull yourself to standing. Repeat __________ times. Complete this exercise __________ times a day. Wall slides This exercise strengthens the muscles in front of your thigh and knee (quadriceps). 1. Lean your back against a smooth wall or door, and walk your feet out 18-24 inches (46-61 cm) from it. 2. Place your feet hip-width apart. 3. Slowly slide down the wall or door until your knees bend __________ degrees. Keep your knees over your heels, not over your toes. Keep your knees in line with your hips. 4. Hold this position for __________ seconds. Repeat __________ times. Complete this exercise __________ times a day. Straight leg raises This exercise strengthens the muscles that rotate the leg at the hip and move it away from your body (hip abductors). 1. Lie on your side with your left / right leg in the top position. Lie so your head, shoulder, knee, and hip line up. You may bend your bottom knee to help you keep your balance. 2. Roll your hips slightly forward so your hips are stacked directly over each other and your left / right knee is facing forward. 3. Leading with your heel, lift your top leg 4-6 inches (10-15 cm). You should feel the muscles in your outer hip lifting. ? Do not let your foot drift forward. ? Do not let your knee roll toward the ceiling. 4. Hold this position for __________ seconds. 5. Slowly return your leg to the starting position. 6. Let your muscles  relax completely after each repetition. Repeat __________ times. Complete this exercise __________ times a day. Straight leg raises This exercise   stretches the muscles that move your hips away from the front of the pelvis (hip extensors). 1. Lie on your abdomen on a firm surface. You can put a pillow under your hips if that is more comfortable. 2. Tense the muscles in your buttocks and lift your left / right leg about 4-6 inches (10-15 cm). Keep your knee straight as you lift your leg. 3. Hold this position for __________ seconds. 4. Slowly lower your leg to the starting position. 5. Let your leg relax completely after each repetition. Repeat __________ times. Complete this exercise __________ times a day. This information is not intended to replace advice given to you by your health care provider. Make sure you discuss any questions you have with your health care provider. Document Revised: 11/02/2017 Document Reviewed: 11/02/2017 Elsevier Patient Education  2020 Elsevier Inc.  

## 2019-06-20 NOTE — Progress Notes (Signed)
Virtual Visit via Video Note changed to phone visit at patient request   This visit type was conducted due to national recommendations for restrictions regarding the COVID-19 Pandemic (e.g. social distancing) in an effort to limit this patient's exposure and mitigate transmission in our community.  Due to her co-morbid illnesses, this patient is at least at moderate risk for complications without adequate follow up.  This format is felt to be most appropriate for this patient at this time.  All issues noted in this document were discussed and addressed.  A limited physical exam was performed with this format.  Please refer to the patient's chart for her consent to telehealth for [facility].   Date:  06/23/2019     HPI: FU cerebrovascular disease. 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 She does not have CAD and had a normal nuclear study 06/17/2006. She's had a left CEA with residual disease.Abdominal ultrasound August 2017 showed greater than 50% bilateral common iliac arteries. No aneurysm. Echocardiogram July 2020 showed normal LV function, grade 1 diastolic dysfunction. Monitor August 2020 showed no significant arrhythmias. Carotid Dopplers February 2021 showed 1 to 39% right and 40 to 59% left stenosis.  Since last seen, she denies dyspnea, chest pain, palpitations or syncope.  Current Outpatient Medications  Medication Sig Dispense Refill  . AMBULATORY NON FORMULARY MEDICATION Inject 300 mg into the skin See admin instructions. Inclisiran Sodium - Inject into the skin as directed. Per pt in [place] cardiology research trial Next appt in May 2021    . aspirin EC 81 MG tablet Take 1 tablet (81 mg total) by mouth daily. 90 tablet 3  . buPROPion (WELLBUTRIN XL) 300 MG 24 hr tablet Take 1 tablet (300 mg total) by mouth daily. 90 tablet 0    . Cholecalciferol (VITAMIN D3) 25 MCG (1000 UT) CAPS Take 1 capsule by mouth daily.    . clopidogrel (PLAVIX) 75 MG tablet Take 1 tablet by mouth once daily (Patient taking differently: Take 75 mg by mouth daily. ) 90 tablet 1  . Dexlansoprazole (DEXILANT) 30 MG capsule Take 1 capsule (30 mg total) by mouth daily. 90 capsule 1  . lisinopril (ZESTRIL) 10 MG tablet Take 1 tablet by mouth daily 90 tablet 0  . meclizine (ANTIVERT) 25 MG tablet Take 1 tablet (25 mg total) by mouth 3 (three) times daily as needed for dizziness. 30 tablet 2  . metoprolol succinate (TOPROL-XL) 50 MG 24 hr tablet TAKE 1 TABLET BY MOUTH ONCE DAILY WITH A MEAL OR  IMMEDIATELY  FORLLOWING  A  MEAL  (APPOINTMENT  NEEDED) 90 tablet 0  . traZODone (DESYREL) 50 MG tablet TAKE 1/2 TO 1 (ONE-HALF TO ONE) TABLET BY MOUTH AT BEDTIME AS NEEDED (Patient taking differently: Take 25-50 mg by mouth at bedtime as needed for sleep. TAKE 1/2 TO 1 (ONE-HALF TO ONE) TABLET BY MOUTH AT BEDTIME AS NEEDED) 90 tablet 1  . vitamin B-12 (CYANOCOBALAMIN) 1000 MCG tablet Take 1,000 mcg by mouth daily.     No current facility-administered medications for this visit.     Past Medical History:  Diagnosis Date  . Aorto-iliac atherosclerosis ([place])    last aaa duplex 08-30-2015  >50% stenosis bilateral common iliac arteries and left external iliac artery  . Arrhythmia   . Carotid stenosis, bilateral    cardiologist-- dr Stanford Breed ([place] 08-06-2015)  04-29-2017 followed by  [facility]/  last duplex 03-08-2017 bilateral ICA 40-59% post endarterectomy,  right ECA occluded  . Cerebral vascular disease   . Chronic gastritis   . Elevated cholesterol   . Esophageal stricture   . Full dentures   . GERD (gastroesophageal reflux disease)   . Hiatal hernia   . History of aortic valve disorder    per cardiology note dated 08-06-2015 by dr Stanford Breed pt has hx small fibroblastoma aortic valve per echo 2008 /  last echo 08-23-2015 no fibroblastoma noted or stenosis or  thickened/ calcification  . History of gastric ulcer 2013  . History of nuclear stress test 06-17-2006   per dr Stanford Breed note dated 08-06-2015 , normal study  . History of transient ischemic attack (TIA) 04/25/2006   severe left ICA stenosis/   04-29-2017 per pt no residual  . HLD (hyperlipidemia)   . HTN (hypertension)   . IBS (irritable bowel syndrome)   . OA (osteoarthritis)   . Pneumonia   . Prolapsed internal hemorrhoids, grade 3   . PVD (peripheral vascular disease) ([place])   . Rectal bleed   . SYMPTOM, ECCHYMOSES, SPONTANEOUS 06/16/2006   Qualifier: Diagnosis of  By: Leanne Chang MD, Bruce    . Vertigo   . Wears contact lenses     Past Surgical History:  Procedure Laterality Date  . CAROTID ENDARTERECTOMY Bilateral left 04-29-2006 and right 08-17-2007 by  dr c. Scot Dock  [facility]  . HEMORRHOID SURGERY N/A 05/07/2017   Procedure: ANAL EXAM UNDER ANESTHESIA, HEMORRHOIDECTOMY;  Surgeon: Leighton Ruff, MD;  Location: [place];  Service: General;  Laterality: N/A;  . NASAL SINUS SURGERY  age 81  . TONSILLECTOMY  age 89  . TRANSTHORACIC ECHOCARDIOGRAM  08/23/2015   ef [id number], grade 1 diastolic dysfunction/ mild AR and MR  . VASCULAR SURGERY Bilateral    carotids  . WRIST GANGLION EXCISION Right yrs ago    Social History   Socioeconomic History  . Marital status: Widowed    Spouse name: Not on file  . Number of children: 0  . Years of education: 11  . Highest education level: Associate degree: occupational, Hotel manager, or vocational program  Occupational History  . Occupation: [employer]    Comment: retired  Tobacco Use  . Smoking status: Former Smoker    Packs/day: 0.50    Years: 30.00    Pack years: 15.00    Types: Cigarettes    Quit date: 04/02/2012    Years since quitting: 7.2  . Smokeless tobacco: Never Used  Substance and Sexual Activity  . Alcohol use: No  . Drug use: No  . Sexual activity: Not on file  Other Topics Concern    . Not on file  Social History Narrative    widow   Worked for [employer].       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 Difficulty of Paying Living Expenses: Not hard at all  Food Insecurity: No Food Insecurity  . Worried About Charity fundraiser in the Last Year: Never true  . Ran Out of Food in the Last Year: Never true  Transportation Needs: No Transportation Needs  . Lack of Transportation (  Medical): No  . Lack of Transportation (Non-Medical): No  Physical Activity: Sufficiently Active  . Days of Exercise per Week: 7 days  . Minutes of Exercise per Session: 40 min  Stress: No Stress Concern Present  . Feeling of Stress : Not at all  Social Connections: Slightly Isolated  . Frequency of Communication with Friends and Family: More than three times a week  . Frequency of Social Gatherings with Friends and Family: Not on file  . Attends Religious Services: More than 4 times per year  . Active Member of Clubs or Organizations: Yes  . Attends Archivist Meetings: Not on file  . Marital Status: Widowed  Intimate Partner Violence:   . Fear of Current or Ex-Partner:   . Emotionally Abused:   Marland Kitchen Physically Abused:   . Sexually Abused:     Family History  Problem Relation Age of Onset  . Stroke Father   . Diabetes Mother   . Asthma Mother   . Heart disease Brother   . Diabetes Other   . Breast cancer Other   . Rheum arthritis Sister   . Colon cancer Neg Hx     ROS: Knee arthralgias but no fevers or chills, productive cough, hemoptysis, dysphasia, odynophagia, melena, hematochezia, dysuria, hematuria, rash, seizure activity, orthopnea, PND, pedal edema,  claudication. Remaining systems are negative.  Physical Exam: No acute distress Answers questions appropriately Normal affect Remainder physical examination not performed (coronavirus pandemic; telehealth visit)  A/P  1 carotid artery disease-plan follow-up carotid Dopplers February 2022.  Continue aspirin and statin.  2 hypertension-blood pressure controlled.  Continue present medical regimen.  3 hyperlipidemia-continue statin.  As outlined previously she did not tolerate high doses in the past.  4 peripheral vascular disease-patient denies claudication.  Continue aspirin and statin.  5 history of aortic valve fibroelastoma-most recent echocardiogram showed trace aortic insufficiency.  COVID-19 Education: The importance of social distancing was discussed today.  Time:   Today, I have spent 15 minutes with the patient with telehealth technology discussing the above problems.     Medication Adjustments/Labs and Tests Ordered: Current medicines are reviewed at length with the patient today.  Concerns regarding medicines are outlined above.   Tests Ordered: No orders of the defined types were placed in this encounter.   Medication Changes: No orders of the defined types were placed in this encounter.   Follow Up:  Either In Person or Virtual in 1 year(s)  Signed, Kirk Ruths, MD  06/23/2019 8:59 AM    [place]

## 2019-06-23 ENCOUNTER — Encounter: Payer: Self-pay | Admitting: Cardiology

## 2019-06-23 ENCOUNTER — Telehealth (INDEPENDENT_AMBULATORY_CARE_PROVIDER_SITE_OTHER): Payer: Medicare PPO | Admitting: Cardiology

## 2019-06-23 VITALS — BP 140/80 | HR 72 | Ht 61.0 in | Wt 147.0 lb

## 2019-06-23 DIAGNOSIS — E7800 Pure hypercholesterolemia, unspecified: Secondary | ICD-10-CM | POA: Diagnosis not present

## 2019-06-23 DIAGNOSIS — I359 Nonrheumatic aortic valve disorder, unspecified: Secondary | ICD-10-CM

## 2019-06-23 DIAGNOSIS — I10 Essential (primary) hypertension: Secondary | ICD-10-CM

## 2019-06-23 DIAGNOSIS — I679 Cerebrovascular disease, unspecified: Secondary | ICD-10-CM

## 2019-06-23 NOTE — Patient Instructions (Signed)

## 2019-07-02 NOTE — Progress Notes (Deleted)
Office Visit Note  Patient: [name] [name]             Date of Birth: 07-19-1945           MRN: [id number]             PCP: Caren Macadam, MD Referring: Caren Macadam, MD Visit Date: 07/11/2019 Occupation: @GUAROCC @  Subjective:  No chief complaint on file.   History of Present Illness: [name] [name] is a 73 y.o. female ***   Activities of Daily Living:  Patient reports morning stiffness for *** {minute/hour:19697}.   Patient {ACTIONS;DENIES/REPORTS:21021675::"Denies"} nocturnal pain.  Difficulty dressing/grooming: {ACTIONS;DENIES/REPORTS:21021675::"Denies"} Difficulty climbing stairs: {ACTIONS;DENIES/REPORTS:21021675::"Denies"} Difficulty getting out of chair: {ACTIONS;DENIES/REPORTS:21021675::"Denies"} Difficulty using hands for taps, buttons, cutlery, and/or writing: {ACTIONS;DENIES/REPORTS:21021675::"Denies"}  No Rheumatology ROS completed.   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/2008   HYPERCHOLESTEROLEMIA 11/16/2008   Osteoarthritis 11/16/2008   Hyperlipemia 06/16/2006   Essential hypertension 06/16/2006   Cerebrovascular disease 06/16/2006   GERD 06/16/2006    Past Medical History:  Diagnosis Date   Aorto-iliac atherosclerosis ([place])    last aaa duplex 08-30-2015  >50% stenosis bilateral common iliac arteries and left external iliac artery   Arrhythmia    Carotid stenosis, bilateral    cardiologist-- dr Stanford Breed ([place] 08-06-2015)  04-29-2017 followed by pcp/  last duplex 03-08-2017 bilateral ICA 40-59% post endarterectomy,  right ECA occluded   Cerebral vascular disease    Chronic gastritis    Elevated cholesterol    Esophageal stricture    Full dentures    GERD (gastroesophageal reflux disease)    Hiatal hernia    History of aortic valve disorder    per cardiology note dated 08-06-2015 by dr Stanford Breed pt has hx small fibroblastoma aortic valve per echo 2008 /  last echo 08-23-2015  no fibroblastoma noted or stenosis or thickened/ calcification   History of gastric ulcer 2013   History of nuclear stress test 06-17-2006   per dr Stanford Breed note dated 08-06-2015 , normal study   History of transient ischemic attack (TIA) 04/25/2006   severe left ICA stenosis/   04-29-2017 per pt no residual   HLD (hyperlipidemia)    HTN (hypertension)    IBS (irritable bowel syndrome)    OA (osteoarthritis)    Pneumonia    Prolapsed internal hemorrhoids, grade 3    PVD (peripheral vascular disease) ([place])    Rectal bleed    SYMPTOM, ECCHYMOSES, SPONTANEOUS 06/16/2006   Qualifier: Diagnosis of  By: Leanne Chang MD, Bruce     Vertigo    Wears contact lenses     Family History  Problem Relation Age of Onset   Stroke Father    Diabetes Mother    Asthma Mother    Heart disease Brother    Diabetes Other    Breast cancer Other    Rheum arthritis Sister    Colon cancer Neg Hx    Past Surgical History:  Procedure Laterality Date   CAROTID ENDARTERECTOMY Bilateral left 04-29-2006 and right 08-17-2007 by  dr c. Scot Dock  [facility]   HEMORRHOID SURGERY N/A 05/07/2017   Procedure: ANAL EXAM UNDER ANESTHESIA, HEMORRHOIDECTOMY;  Surgeon: Leighton Ruff, MD;  Location: [place];  Service: General;  Laterality: N/A;   NASAL SINUS SURGERY  age 51   TONSILLECTOMY  age 60   TRANSTHORACIC ECHOCARDIOGRAM  08/23/2015   ef 84-53%, grade 1 diastolic dysfunction/ mild AR and MR   VASCULAR  SURGERY Bilateral    carotids   WRIST GANGLION EXCISION Right yrs ago   Social History   Social History Narrative    widow   Worked for [employer].       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[employer], substitute for [place] important part of her life   Has three living siblings, local, good support Psychologist, occupational at nursing facilities with hospice   Immunization History  Administered  Date(s) Administered   Influenza-Unspecified 01/26/2018   Pneumococcal Conjugate-13 12/11/2013, 03/02/2018   Pneumococcal Polysaccharide-23 01/24/2015   Tdap 01/26/2009   Zoster Recombinat (Shingrix) 03/02/2018, 09/02/2018     Objective: Vital Signs: There were no vitals taken for this visit.   Physical Exam   Musculoskeletal Exam: ***  CDAI Exam: CDAI Score: -- Patient Global: --; Provider Global: -- Swollen: --; Tender: -- Joint Exam 07/11/2019   No joint exam has been documented for this visit   There is currently no information documented on the homunculus. Go to the Rheumatology activity and complete the homunculus joint exam.  Investigation: No additional findings.  Imaging: XR Hand 2 View Left  Result Date: 06/13/2019 CMC, PIP and DIP narrowing was noted.  No intercarpal radiocarpal joint space narrowing was noted.  No erosive changes were noted. Impression: These findings are consistent with osteoarthritis of the knee.  XR Hand 2 View Right  Result Date: 06/13/2019 CMC, PIP and DIP narrowing was noted.  No MCP, intercarpal or radiocarpal joint space narrowing was noted. Impression: These findings are consistent with osteoarthritis of the hand.  XR KNEE 3 VIEW LEFT  Result Date: 06/13/2019 Mild medial compartment narrowing was noted.  Mild patellofemoral narrowing was noted.  No chondrocalcinosis was noted. Impression: These findings are consistent with mild osteoarthritis and mild chondromalacia patella.  XR KNEE 3 VIEW RIGHT  Result Date: 06/13/2019 Mild medial compartment narrowing was noted.  Mild patellofemoral narrowing was noted.  No chondrocalcinosis was noted. Impression: These findings are consistent with mild osteoarthritis and mild chondromalacia patella.   Recent Labs: Lab Results  Component Value Date   WBC 5.7 03/25/2019   HGB 13.1 03/25/2019   PLT 205 03/25/2019   NA 138 03/25/2019   K 4.1 03/25/2019   CL 108 03/25/2019   CO2 24  03/25/2019   GLUCOSE 128 (H) 03/25/2019   BUN 18 03/25/2019   CREATININE 0.79 03/25/2019   BILITOT 0.4 03/20/2019   ALKPHOS 94 03/20/2019   AST 21 03/20/2019   ALT 21 03/20/2019   PROT 7.0 03/20/2019   ALBUMIN 4.7 03/20/2019   CALCIUM 8.7 (L) 03/25/2019   GFRAA >60 03/25/2019    Speciality Comments: No specialty comments available.  Procedures:  No procedures performed Allergies: Sulfa antibiotics   Assessment / Plan:     Visit Diagnoses: No diagnosis found.  Orders: No orders of the defined types were placed in this encounter.  No orders of the defined types were placed in this encounter.   Face-to-face time spent with patient was *** minutes. Greater than 50% of time was spent in counseling and coordination of care.  Follow-Up Instructions: No follow-ups on file.   Bo Merino, MD  Note - This record has been created using Editor, commissioning.  Chart creation errors have been sought, but may not always  have been located. Such creation errors do not reflect on  the standard of medical care.

## 2019-07-11 ENCOUNTER — Ambulatory Visit: Payer: Medicare PPO | Admitting: Rheumatology

## 2019-07-14 NOTE — Progress Notes (Deleted)
Office Visit Note  Patient: [name] [name]             Date of Birth: [birth year]           MRN: [id number]             PCP: Caren Macadam, MD Referring: Caren Macadam, MD Visit Date: 07/18/2019 Occupation: @GUAROCC @  Subjective:  No chief complaint on file.   History of Present Illness: [name] [name] is a 74 y.o. female ***   Activities of Daily Living:  Patient reports morning stiffness for *** {minute/hour:19697}.   Patient {ACTIONS;DENIES/REPORTS:21021675::"Denies"} nocturnal pain.  Difficulty dressing/grooming: {ACTIONS;DENIES/REPORTS:21021675::"Denies"} Difficulty climbing stairs: {ACTIONS;DENIES/REPORTS:21021675::"Denies"} Difficulty getting out of chair: {ACTIONS;DENIES/REPORTS:21021675::"Denies"} Difficulty using hands for taps, buttons, cutlery, and/or writing: {ACTIONS;DENIES/REPORTS:21021675::"Denies"}  No Rheumatology ROS completed.   PMFS History:  Patient Active Problem List   Diagnosis Date Noted  . 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 HYPERCHOLESTEROLEMIA 11/16/2008  . Osteoarthritis 11/16/2008  . Hyperlipemia 06/16/2006  . Essential hypertension 06/16/2006  . Cerebrovascular disease 06/16/2006  . GERD 06/16/2006    Past Medical History:  Diagnosis Date  . Aorto-iliac atherosclerosis ([place])    last aaa duplex 08-30-2015  >50% stenosis bilateral common iliac arteries and left external iliac artery  . Arrhythmia   . Carotid stenosis, bilateral    cardiologist-- dr Stanford Breed ([place] 08-06-2015)  04-29-2017 followed by pcp/  last duplex 03-08-2017 bilateral ICA 40-59% post endarterectomy,  right ECA occluded  . Cerebral vascular disease   . Chronic gastritis   . Elevated cholesterol   . Esophageal stricture   . Full dentures   . GERD (gastroesophageal reflux disease)   . Hiatal hernia   . History of aortic valve disorder    per cardiology note dated 08-06-2015 by dr Stanford Breed pt has hx small fibroblastoma aortic valve per echo 2008 /  last echo 08-23-2015  no fibroblastoma noted or stenosis or thickened/ calcification  . History of gastric ulcer 2013  . History of nuclear stress test 06-17-2006   per dr Stanford Breed note dated 08-06-2015 , normal study  . History of transient ischemic attack (TIA) 04/25/2006   severe left ICA stenosis/   04-29-2017 per pt no residual  . HLD (hyperlipidemia)   . HTN (hypertension)   . IBS (irritable bowel syndrome)   . OA (osteoarthritis)   . Pneumonia   . Prolapsed internal hemorrhoids, grade 3   . PVD (peripheral vascular disease) ([place])   . Rectal bleed   . SYMPTOM, ECCHYMOSES, SPONTANEOUS 06/16/2006   Qualifier: Diagnosis of  By: Leanne Chang MD, Bruce    . Vertigo   . Wears contact lenses     Family History  Problem Relation Age of Onset  . Stroke Father   . Diabetes Mother   . Asthma Mother   . Heart disease Brother   . Diabetes Other   . Breast cancer Other   . Rheum arthritis Sister   . Colon cancer Neg Hx    Past Surgical History:  Procedure Laterality Date  . CAROTID ENDARTERECTOMY Bilateral left 04-29-2006 and right 08-17-2007 by  dr c. Scot Dock  [facility]  . HEMORRHOID SURGERY N/A 05/07/2017   Procedure: ANAL EXAM UNDER ANESTHESIA, HEMORRHOIDECTOMY;  Surgeon: Leighton Ruff, MD;  Location: [place];  Service: General;  Laterality: N/A;  . NASAL SINUS SURGERY  age 59  . TONSILLECTOMY  age 38  . TRANSTHORACIC ECHOCARDIOGRAM  08/23/2015   ef 50-09%, grade 1 diastolic dysfunction/ mild AR and MR  . VASCULAR  SURGERY Bilateral    carotids  . WRIST GANGLION EXCISION Right yrs ago   Social History   Social History Narrative    widow   Worked for [employer].       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[employer], substitute for [place] important part of her life   Has three living siblings, local, good support Psychologist, occupational at nursing facilities with hospice   Immunization History  Administered  Date(s) Administered  . Influenza-Unspecified 01/26/2018  . Pneumococcal Conjugate-13 12/11/2013, 03/02/2018  . Pneumococcal Polysaccharide-23 01/24/2015  . Tdap 01/26/2009  . Zoster Recombinat (Shingrix) 03/02/2018, 09/02/2018     Objective: Vital Signs: There were no vitals taken for this visit.   Physical Exam   Musculoskeletal Exam: ***  CDAI Exam: CDAI Score: -- Patient Global: --; Provider Global: -- Swollen: --; Tender: -- Joint Exam 07/18/2019   No joint exam has been documented for this visit   There is currently no information documented on the homunculus. Go to the Rheumatology activity and complete the homunculus joint exam.  Investigation: No additional findings.  Imaging: No results found.  Recent Labs: Lab Results  Component Value Date   WBC 5.7 03/25/2019   HGB 13.1 03/25/2019   PLT 205 03/25/2019   NA 138 03/25/2019   K 4.1 03/25/2019   CL 108 03/25/2019   CO2 24 03/25/2019   GLUCOSE 128 (H) 03/25/2019   BUN 18 03/25/2019   CREATININE 0.79 03/25/2019   BILITOT 0.4 03/20/2019   ALKPHOS 94 03/20/2019   AST 21 03/20/2019   ALT 21 03/20/2019   PROT 7.0 03/20/2019   ALBUMIN 4.7 03/20/2019   CALCIUM 8.7 (L) 03/25/2019   GFRAA >60 03/25/2019    Speciality Comments: No specialty comments available.  Procedures:  No procedures performed Allergies: Sulfa antibiotics   Assessment / Plan:     Visit Diagnoses: No diagnosis found.  Orders: No orders of the defined types were placed in this encounter.  No orders of the defined types were placed in this encounter.   Face-to-face time spent with patient was *** minutes. Greater than 50% of time was spent in counseling and coordination of care.  Follow-Up Instructions: No follow-ups on file.   Bo Merino, MD  Note - This record has been created using Editor, commissioning.  Chart creation errors have been sought, but may not always  have been located. Such creation errors do not reflect on   the standard of medical care.

## 2019-07-18 ENCOUNTER — Ambulatory Visit: Payer: Medicare PPO | Admitting: Rheumatology

## 2019-07-23 ENCOUNTER — Other Ambulatory Visit: Payer: Self-pay | Admitting: Family Medicine

## 2019-08-05 ENCOUNTER — Other Ambulatory Visit: Payer: Self-pay | Admitting: Family Medicine

## 2019-08-14 ENCOUNTER — Other Ambulatory Visit: Payer: Self-pay | Admitting: Family Medicine

## 2019-08-16 NOTE — Telephone Encounter (Signed)
Refill phoned in to [facility] due to escribe failure.

## 2019-08-20 ENCOUNTER — Other Ambulatory Visit: Payer: Self-pay | Admitting: Family Medicine

## 2019-08-30 ENCOUNTER — Other Ambulatory Visit: Payer: Self-pay | Admitting: Family Medicine

## 2019-08-30 DIAGNOSIS — K219 Gastro-esophageal reflux disease without esophagitis: Secondary | ICD-10-CM

## 2019-09-27 ENCOUNTER — Other Ambulatory Visit: Payer: Self-pay | Admitting: Family Medicine

## 2019-09-27 DIAGNOSIS — I10 Essential (primary) hypertension: Secondary | ICD-10-CM

## 2019-11-22 ENCOUNTER — Other Ambulatory Visit: Payer: Self-pay | Admitting: Family Medicine

## 2019-11-22 DIAGNOSIS — K219 Gastro-esophageal reflux disease without esophagitis: Secondary | ICD-10-CM

## 2019-12-10 ENCOUNTER — Other Ambulatory Visit: Payer: Self-pay | Admitting: Family Medicine

## 2019-12-10 DIAGNOSIS — I10 Essential (primary) hypertension: Secondary | ICD-10-CM

## 2019-12-14 ENCOUNTER — Encounter: Payer: Medicare PPO | Admitting: *Deleted

## 2019-12-14 ENCOUNTER — Other Ambulatory Visit: Payer: Self-pay

## 2019-12-14 VITALS — BP 161/54 | HR 71 | Resp 18 | Wt 147.0 lb

## 2019-12-14 DIAGNOSIS — Z006 Encounter for examination for normal comparison and control in clinical research program: Secondary | ICD-10-CM

## 2019-12-14 NOTE — Research (Signed)
Subject came to research lab today for the [place] 8 Visit 6 Follow-up visit. All concomitant medications have been updated if applicable. No new AE's or SAE's to report to sponsor at this time. Subject was given IP injection subcutaneously into the left abdomen at 0915. Subject remained in research lab for 30 minutes after injection for monitoring, subject tolerated well. Next appointment scheduled for Tuesday, May 10th, 2022 @ 0930. Subject left research lab at 0945.

## 2020-01-25 ENCOUNTER — Other Ambulatory Visit: Payer: Self-pay | Admitting: Family Medicine

## 2020-01-31 IMAGING — US ULTRASOUND ABDOMEN COMPLETE
1 series · 14 of 25 positions shown · non-contrast
Comparison: None.

CLINICAL DATA: Pain.

EXAM:
ABDOMEN ULTRASOUND COMPLETE

[Series 1: ultrasound abdomen complete · 14 of 41 slices shown]
[im 1/41]
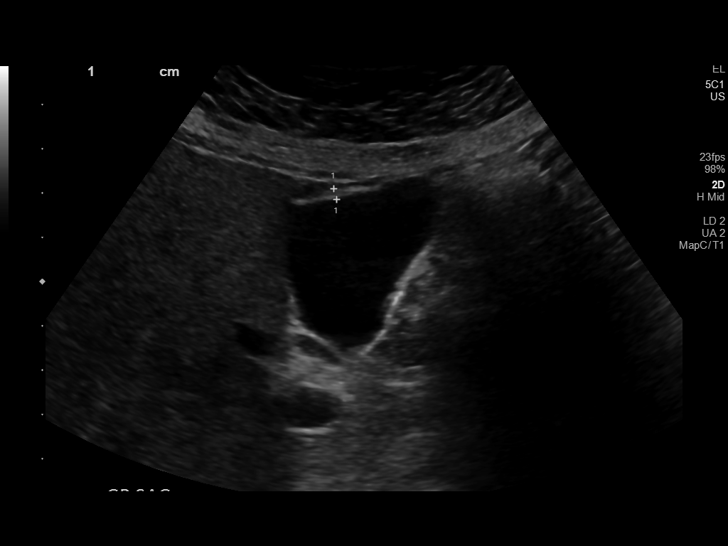
[im 4/41]
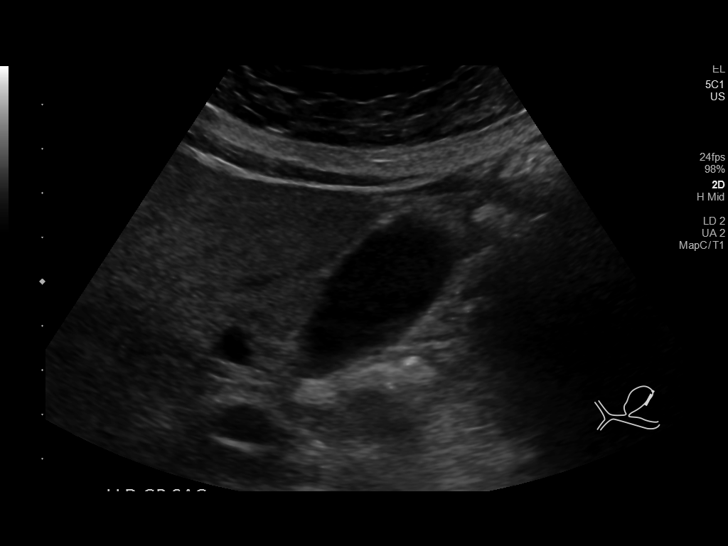
[im 7/41]
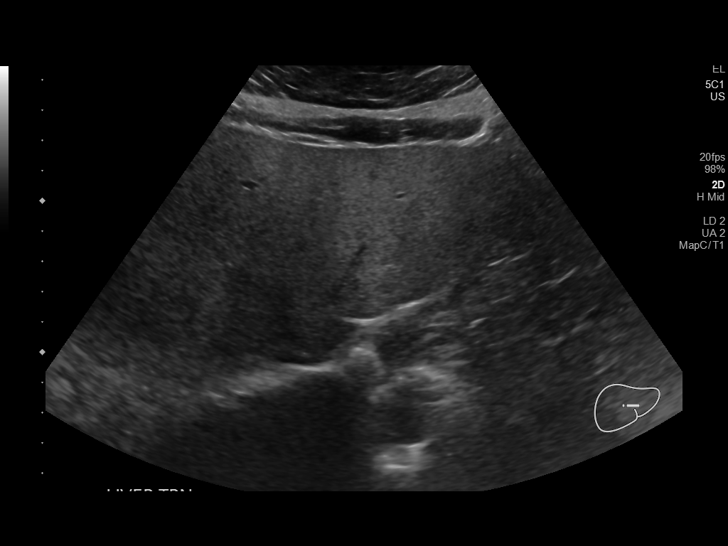
[im 11/41]
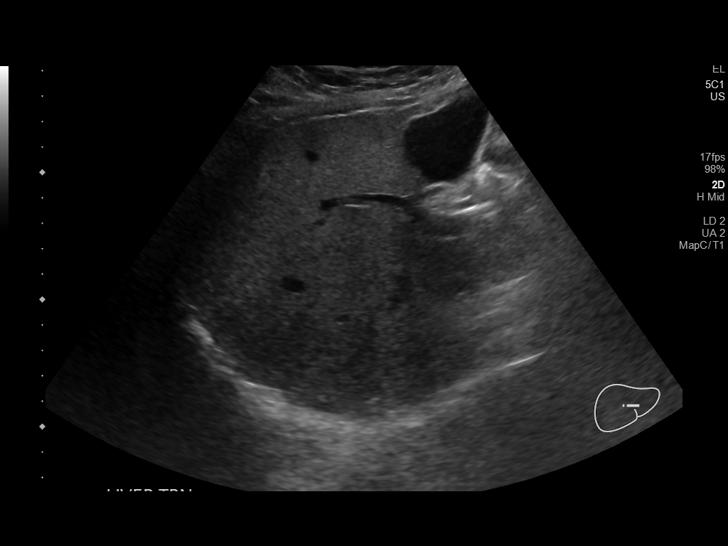
[im 14/41]
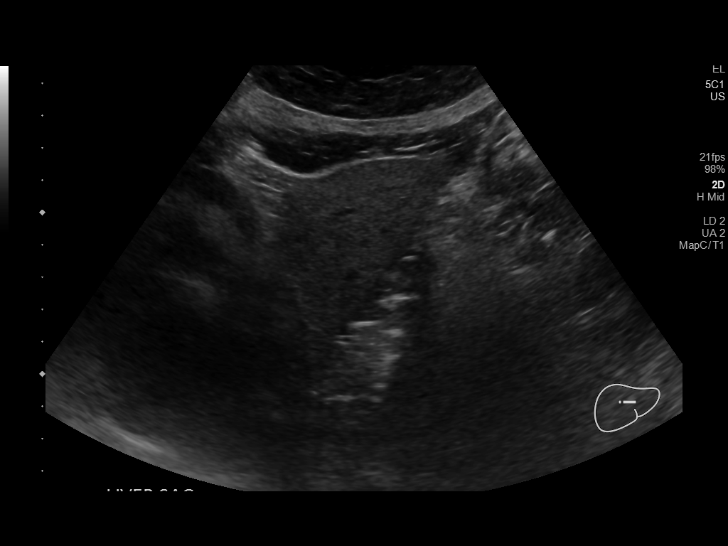
[im 16/41]
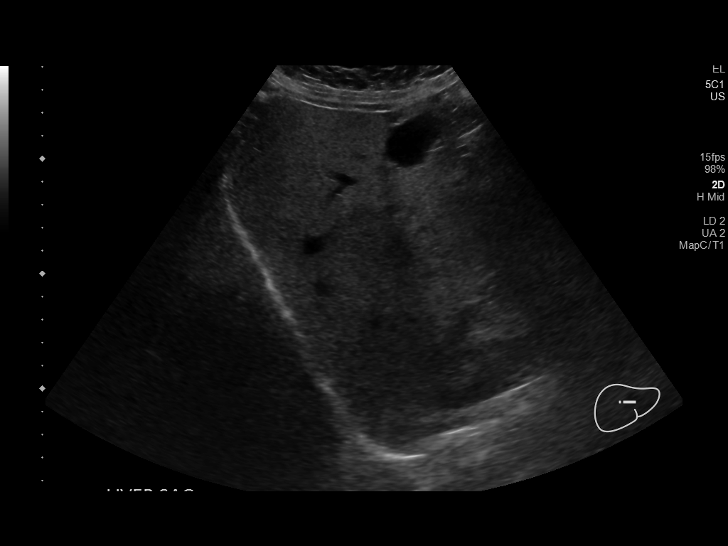
[im 19/41]
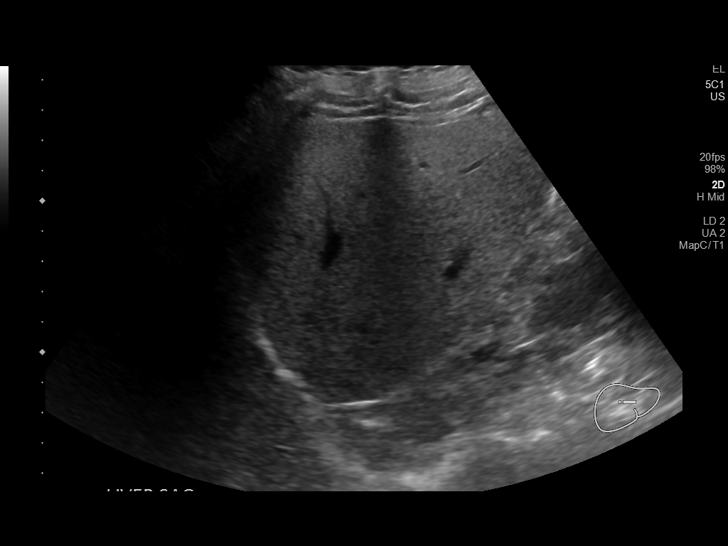
[im 22/41]
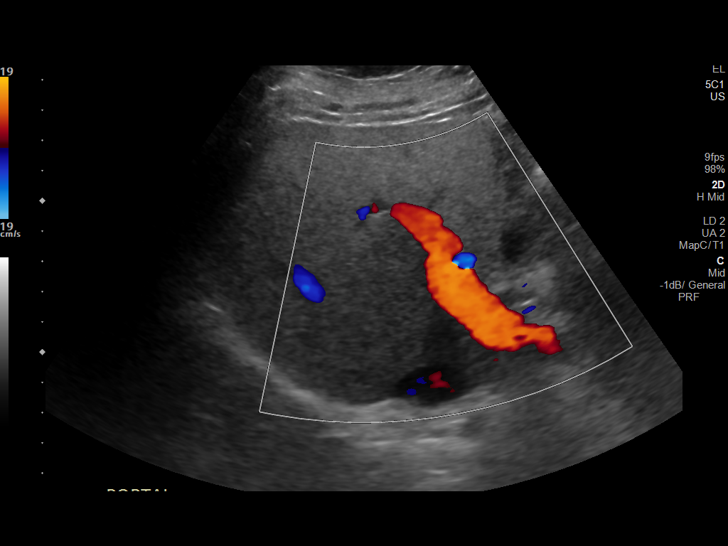
[im 26/41]
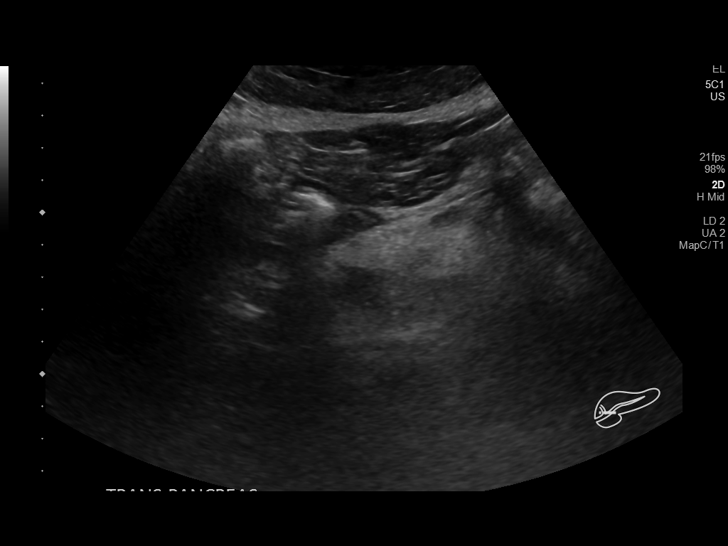
[im 27/41]
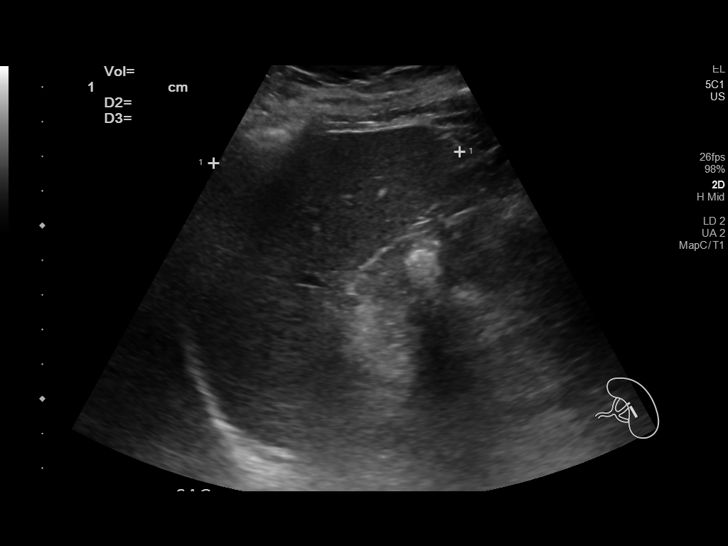
[im 31/41]
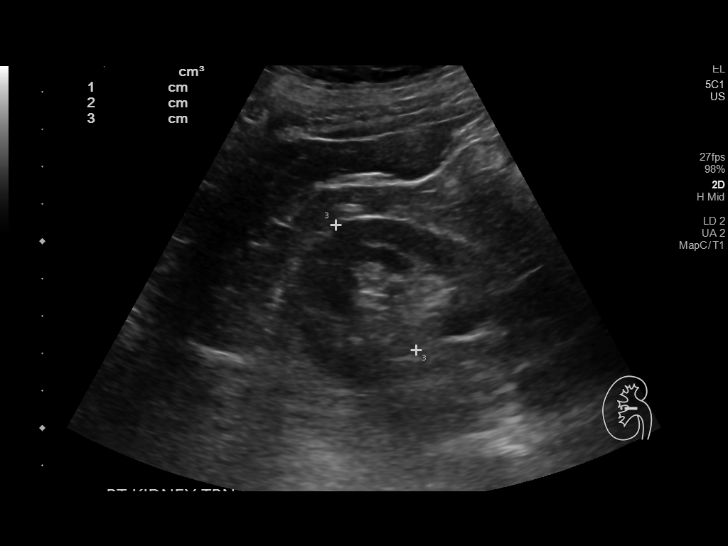
[im 34/41]
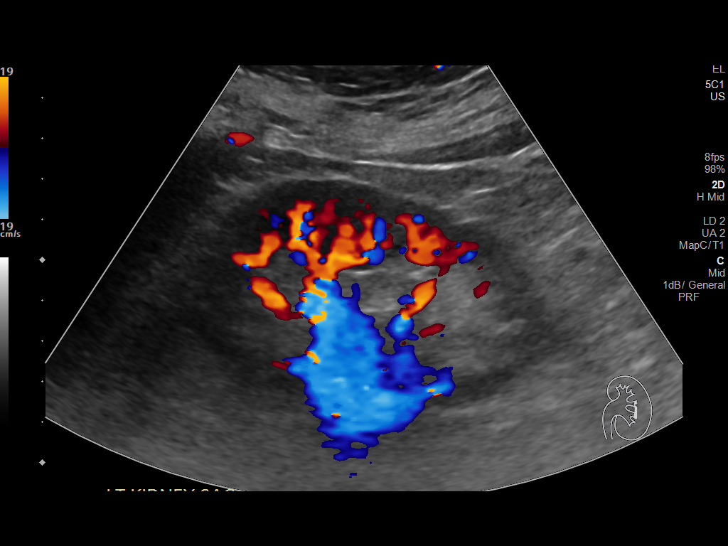
[im 37/41]
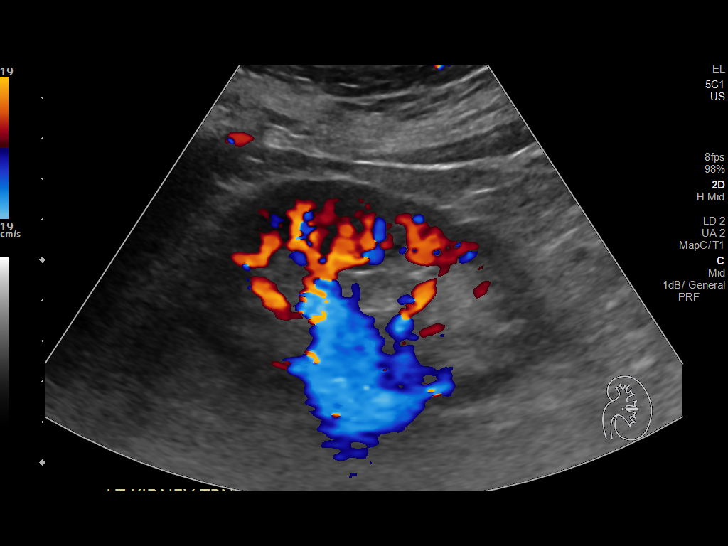
[im 41/41]
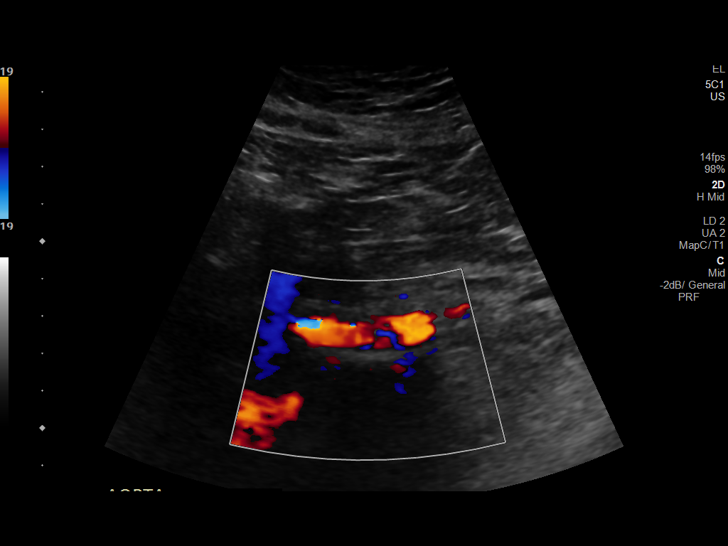

[14 of 25 positions shown; findings below may reference images not displayed]

FINDINGS: Gallbladder: No gallstones or wall thickening visualized. No
sonographic Murphy sign noted by sonographer.

Common bile duct: Diameter: 3.1 mm

Liver: No focal lesion identified. Within normal limits in
parenchymal echogenicity. Portal vein is patent on color Doppler
imaging with normal direction of blood flow towards the liver.

IVC: No abnormality visualized.

Pancreas: Visualized portion unremarkable.

Spleen: Size and appearance within normal limits.

Right Kidney: Length: 9.8 cm. Echogenicity within normal limits. No
mass or hydronephrosis visualized.

Left Kidney: Length: 10.3 cm. Echogenicity within normal limits. No
mass or hydronephrosis visualized.

Abdominal aorta: No aneurysm visualized.

Other findings: None.
IMPRESSION: Normal study.

## 2020-02-19 ENCOUNTER — Telehealth: Payer: Self-pay | Admitting: Family Medicine

## 2020-02-19 DIAGNOSIS — K219 Gastro-esophageal reflux disease without esophagitis: Secondary | ICD-10-CM

## 2020-02-19 MED ORDER — BUPROPION HCL ER (XL) 300 MG PO TB24: ORAL_TABLET | ORAL | 0 refills | Status: DC

## 2020-02-19 MED ORDER — PANTOPRAZOLE SODIUM 40 MG PO TBEC: DELAYED_RELEASE_TABLET | ORAL | 0 refills | Status: DC

## 2020-02-19 NOTE — Telephone Encounter (Signed)
Rx done. 

## 2020-02-19 NOTE — Telephone Encounter (Signed)
Pt is calling in stating that she is out Rx's pantoprazole (PROTONIX) 40 MG and bupropion (WELLBUTRIN XL) 300 MG.  Pharm:  [facility] on [address].

## 2020-02-22 ENCOUNTER — Other Ambulatory Visit: Payer: Self-pay

## 2020-02-22 ENCOUNTER — Encounter: Payer: Self-pay | Admitting: Family Medicine

## 2020-02-22 ENCOUNTER — Ambulatory Visit: Payer: Medicare PPO | Admitting: Family Medicine

## 2020-02-22 VITALS — HR 78 | Temp 98.1°F | Ht 61.0 in | Wt 148.0 lb

## 2020-02-22 DIAGNOSIS — M1991 Primary osteoarthritis, unspecified site: Secondary | ICD-10-CM

## 2020-02-22 DIAGNOSIS — K219 Gastro-esophageal reflux disease without esophagitis: Secondary | ICD-10-CM

## 2020-02-22 DIAGNOSIS — R7303 Prediabetes: Secondary | ICD-10-CM | POA: Diagnosis not present

## 2020-02-22 DIAGNOSIS — E7800 Pure hypercholesterolemia, unspecified: Secondary | ICD-10-CM

## 2020-02-22 DIAGNOSIS — E875 Hyperkalemia: Secondary | ICD-10-CM | POA: Diagnosis not present

## 2020-02-22 DIAGNOSIS — I10 Essential (primary) hypertension: Secondary | ICD-10-CM | POA: Diagnosis not present

## 2020-02-22 LAB — LIPID PANEL
Cholesterol: 164 mg/dL (ref 0–200)
HDL: 53.9 mg/dL (ref >39.00)
NonHDL: 110.41
Total CHOL/HDL Ratio: 3
Triglycerides: 308 mg/dL — ABNORMAL HIGH (ref 0.0–149.0)
VLDL: 61.6 mg/dL — ABNORMAL HIGH (ref 0.0–40.0)

## 2020-02-22 LAB — COMPREHENSIVE METABOLIC PANEL
ALT: 22 U/L (ref 0–35)
AST: 28 U/L (ref 0–37)
Albumin: 4.5 g/dL (ref 3.5–5.2)
Alkaline Phosphatase: 89 U/L (ref 39–117)
BUN: 17 mg/dL (ref 6–23)
CO2: 25 mEq/L (ref 19–32)
Calcium: 10.2 mg/dL (ref 8.4–10.5)
Chloride: 106 mEq/L (ref 96–112)
Creatinine, Ser: 1.12 mg/dL (ref 0.40–1.20)
GFR: 47.96 mL/min — ABNORMAL LOW (ref >60.00)
Glucose, Bld: 87 mg/dL (ref 70–99)
Potassium: 5.3 mEq/L — ABNORMAL HIGH (ref 3.5–5.1)
Sodium: 141 mEq/L (ref 135–145)
Total Bilirubin: 0.3 mg/dL (ref 0.2–1.2)
Total Protein: 7.5 g/dL (ref 6.0–8.3)

## 2020-02-22 LAB — TSH: TSH: 1.57 u[IU]/mL (ref 0.35–4.50)

## 2020-02-22 LAB — LDL CHOLESTEROL, DIRECT: Direct LDL: 77 mg/dL

## 2020-02-22 NOTE — Patient Instructions (Signed)
Please check blood pressures at home and let me know if regularly running at/over 135/85.

## 2020-02-22 NOTE — Progress Notes (Signed)
[name] [name] DOB: [birth year] Encounter date: 02/22/2020  This is a 75 y.o. female who presents with Chief Complaint  Patient presents with  . Medication Refill    History of present illness: Last visit with me was 02/2019: at that time she discussed concerns with stomach pain and arthritis. 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 She states that all joints hurt.   Last bloodwork 02/2019.   She doesn't have specific concerns today. Has had some episodes where she just walks short distance and feels all energy leaves her. She states this has been going on for awhile. She said she feels like heart is tired and just needs a minute to catch up. Doesn't happen every time is active; just sometimes. Lasts just seconds and then she feels better. Doesn't do anything to make her feel better. Not related to eating.   She hasn't had vertigo again. Sometimes spins too quick and then will have brief sensation of vertigo.   She is in lipid research and seeing them twice yearly to monitor.   HTN: lisinopril 10mg  daily, metoprpolol 50mg  daily  Hx of TIA s/p bilat carotid endarterectomy - plavix, asa  Impaired fasting glucose:she does watch what she eats; watches carbs.  Allergies  Allergen Reactions  . Sulfa Antibiotics Swelling   Current Meds  Medication Sig  . AMBULATORY NON FORMULARY MEDICATION Inject 300 mg into the skin See admin instructions. Inclisiran Sodium - Inject into the skin as directed. Per pt in [place] cardiology research trial Next appt in May 2021  . aspirin EC 81 MG tablet Take 1 tablet (81 mg total) by mouth daily.  Marland Kitchen buPROPion (WELLBUTRIN XL) 300 MG 24 hr tablet TAKE 1 TABLET BY MOUTH ONCE DAILY **  PT  NEEDS  APPOINTMENT  **  . Cholecalciferol (VITAMIN D3) 25 MCG (1000 UT) CAPS Take 1 capsule  by mouth daily.  . clopidogrel (PLAVIX) 75 MG tablet Take 1 tablet by mouth once daily  . Dexlansoprazole (DEXILANT) 30 MG capsule Take 1 capsule (30 mg total) by mouth daily.  Marland Kitchen lisinopril (ZESTRIL) 10 MG tablet TAKE 1 TABLET BY MOUTH ONCE DAILY . APPOINTMENT REQUIRED FOR FUTURE REFILLS  . meclizine (ANTIVERT) 25 MG tablet Take 1 tablet (25 mg total) by mouth 3 (three) times daily as needed for dizziness.  . metoprolol succinate (TOPROL-XL) 50 MG 24 hr tablet TAKE 1 TABLET BY MOUTH ONCE DAILY WITH A MEAL OR  IMMEDIATELY  FOLLOWING  A  MEAL.  (APPOINTMENT  NEEDED)  . pantoprazole (PROTONIX) 40 MG tablet TAKE 1 TABLET BY MOUTH ONCE DAILY **PATIENT  NEEDS  AN  APPOINTMENT**  . traZODone (DESYREL) 50 MG tablet Take 0.5-1 tablets (25-50 mg total) by mouth at bedtime as needed for sleep. TAKE 1/2 TO 1 (ONE-HALF TO ONE) TABLET BY MOUTH AT BEDTIME AS NEEDED  . vitamin B-12 (CYANOCOBALAMIN) 1000 MCG tablet Take 1,000 mcg by mouth daily.    Review of Systems  Constitutional: Negative for chills, fatigue and fever.  Respiratory: Negative for cough, chest tightness, shortness of breath and wheezing.   Cardiovascular: Negative for chest pain, palpitations and leg swelling.    Objective:  Pulse 78   Temp 98.1 F (36.7 C) (Oral)   Ht 5\' 1"  (1.549 m)   Wt 148 lb (67.1 kg)   SpO2 96%   BMI 27.96 kg/m  Weight: 148 lb (67.1 kg)   BP Readings from Last 3 Encounters:  12/14/19 (!) 161/54  06/23/19 140/80  06/13/19 128/64   Wt Readings from Last 3 Encounters:  02/22/20 148 lb (67.1 kg)  12/14/19 147 lb (66.7 kg)  06/23/19 147 lb (66.7 kg)    Physical Exam Constitutional:      General: She is not in acute distress.    Appearance: She is well-developed.  Cardiovascular:     Rate and Rhythm: Normal rate and regular rhythm.     Heart sounds: Normal heart sounds. No murmur heard. No friction rub.  Pulmonary:     Effort: Pulmonary effort is normal. No respiratory distress.     Breath sounds:  Normal breath sounds. No wheezing or rales.  Musculoskeletal:     Right lower leg: No edema.     Left lower leg: No edema.  Neurological:     Mental Status: She is alert and oriented to person, place, and time.  Psychiatric:        Behavior: Behavior normal.     Assessment/Plan  1. Essential hypertension Blood pressure elevated on my recheck but she has had lower readings at other offices. She is going to check at home and report back if numbers are running over 135/85. Continue with lisinopril 10mg , toprol 50mg . Encouraged her to also monitor for lows at home. She was orthostatic on check in office so I wonder if this is contributing to "drained" feeling (even though pressures were high in office). I have asked her to make sure she is staying well hydrated at home and monitor bp esp when she gets that feeling.   - Comprehensive metabolic panel; Future - Comprehensive metabolic panel - CBC with Differential/Platelet; Future - Hemoglobin A1c; Future  2. Gastroesophageal reflux disease, unspecified whether esophagitis present Continue with protonix 40mg  daily  3. Primary osteoarthritis, unspecified site She stays active; generally tries to avoid taking medications.   4. HYPERCHOLESTEROLEMIA Has been in liplid trial; 300mg  inclisiran. Will continue to monitor. Lipids better on this than they have been on other meds in past.  - Lipid panel; Future - TSH; Future - TSH - Lipid panel - CBC with Differential/Platelet; Future - Hemoglobin A1c; Future  5. Prediabetes - CBC with Differential/Platelet; Future - Hemoglobin A1c; Future  6. Hyperkalemia - Basic metabolic panel; Future    Return in about 6 months (around 08/21/2020) for physical exam.     Micheline Rough, MD

## 2020-02-27 ENCOUNTER — Other Ambulatory Visit: Payer: Self-pay

## 2020-02-27 ENCOUNTER — Other Ambulatory Visit (INDEPENDENT_AMBULATORY_CARE_PROVIDER_SITE_OTHER): Payer: Medicare PPO

## 2020-02-27 DIAGNOSIS — R7303 Prediabetes: Secondary | ICD-10-CM | POA: Diagnosis not present

## 2020-02-27 DIAGNOSIS — I10 Essential (primary) hypertension: Secondary | ICD-10-CM

## 2020-02-27 DIAGNOSIS — E7800 Pure hypercholesterolemia, unspecified: Secondary | ICD-10-CM

## 2020-02-27 LAB — CBC WITH DIFFERENTIAL/PLATELET
Basophils Absolute: 0 10*3/uL (ref 0.0–0.1)
Basophils Relative: 1.1 % (ref 0.0–3.0)
Eosinophils Absolute: 0.1 10*3/uL (ref 0.0–0.7)
Eosinophils Relative: 1.7 % (ref 0.0–5.0)
HCT: 39 % (ref 36.0–46.0)
Hemoglobin: 13.4 g/dL (ref 12.0–15.0)
Lymphocytes Relative: 26.1 % (ref 12.0–46.0)
Lymphs Abs: 1.2 10*3/uL (ref 0.7–4.0)
MCHC: 34.3 g/dL (ref 30.0–36.0)
MCV: 88.6 fl (ref 78.0–100.0)
Monocytes Absolute: 0.4 10*3/uL (ref 0.1–1.0)
Monocytes Relative: 7.9 % (ref 3.0–12.0)
Neutro Abs: 2.9 10*3/uL (ref 1.4–7.7)
Neutrophils Relative %: 63.2 % (ref 43.0–77.0)
Platelets: 239 10*3/uL (ref 150.0–400.0)
RBC: 4.41 Mil/uL (ref 3.87–5.11)
RDW: 13.1 % (ref 11.5–15.5)
WBC: 4.5 10*3/uL (ref 4.0–10.5)

## 2020-02-27 LAB — HEMOGLOBIN A1C: Hgb A1c MFr Bld: 6.4 % (ref 4.6–6.5)

## 2020-03-01 ENCOUNTER — Other Ambulatory Visit: Payer: Self-pay | Admitting: Family Medicine

## 2020-03-01 DIAGNOSIS — E875 Hyperkalemia: Secondary | ICD-10-CM

## 2020-03-01 DIAGNOSIS — I10 Essential (primary) hypertension: Secondary | ICD-10-CM

## 2020-03-06 ENCOUNTER — Other Ambulatory Visit (INDEPENDENT_AMBULATORY_CARE_PROVIDER_SITE_OTHER): Payer: Medicare PPO

## 2020-03-06 ENCOUNTER — Ambulatory Visit (INDEPENDENT_AMBULATORY_CARE_PROVIDER_SITE_OTHER): Payer: Medicare PPO

## 2020-03-06 ENCOUNTER — Other Ambulatory Visit: Payer: Self-pay

## 2020-03-06 ENCOUNTER — Encounter: Payer: Self-pay | Admitting: *Deleted

## 2020-03-06 ENCOUNTER — Encounter: Payer: Medicare PPO | Admitting: *Deleted

## 2020-03-06 VITALS — BP 144/82 | HR 80 | Temp 98.2°F | Ht 61.0 in | Wt 148.2 lb

## 2020-03-06 DIAGNOSIS — E875 Hyperkalemia: Secondary | ICD-10-CM

## 2020-03-06 DIAGNOSIS — Z0000 Encounter for general adult medical examination without abnormal findings: Secondary | ICD-10-CM | POA: Diagnosis not present

## 2020-03-06 DIAGNOSIS — I10 Essential (primary) hypertension: Secondary | ICD-10-CM | POA: Diagnosis not present

## 2020-03-06 DIAGNOSIS — Z006 Encounter for examination for normal comparison and control in clinical research program: Secondary | ICD-10-CM

## 2020-03-06 LAB — BASIC METABOLIC PANEL
BUN: 19 mg/dL (ref 6–23)
CO2: 27 mEq/L (ref 19–32)
Calcium: 9.4 mg/dL (ref 8.4–10.5)
Chloride: 103 mEq/L (ref 96–112)
Creatinine, Ser: 1.05 mg/dL (ref 0.40–1.20)
GFR: 51.81 mL/min — ABNORMAL LOW (ref >60.00)
Glucose, Bld: 103 mg/dL — ABNORMAL HIGH (ref 70–99)
Potassium: 4.2 mEq/L (ref 3.5–5.1)
Sodium: 138 mEq/L (ref 135–145)

## 2020-03-06 NOTE — Progress Notes (Signed)
Subjective:   [name] [name] is a 75 y.o. female who presents for Medicare Annual (Subsequent) preventive examination.  Review of Systems    N/A  Cardiac Risk Factors include: advanced age (>18men, >12 women);hypertension;dyslipidemia     Objective:    Today's Vitals   03/06/20 1121 03/06/20 1131  BP: (!) 144/82   Pulse: 80   Temp: 98.2 F (36.8 C)   TempSrc: Oral   SpO2: 98%   Weight: 148 lb 3 oz (67.2 kg)   Height: 5\' 1"  (1.549 m)   PainSc:  8    Body mass index is 28 kg/m.  Advanced Directives 03/06/2020 03/25/2019 03/06/2019 05/07/2017  Does Patient Have a Medical Advance Directive? Yes Yes Yes Yes  Type of Paramedic of [place];Living will [place];Living will [place];Living will [place];Living will  Does patient want to make changes to medical advance directive? No - Patient declined - No - Patient declined No - Patient declined  Copy of [place] in Chart? No - copy requested No - copy requested No - copy requested No - copy requested    Current Medications (verified) Outpatient Encounter Medications as of 03/06/2020  Medication Sig  . AMBULATORY NON FORMULARY MEDICATION Inject 300 mg into the skin See admin instructions. Inclisiran Sodium - Inject into the skin as directed. Per pt in [place] cardiology research trial Next appt in May 2021  . aspirin EC 81 MG tablet Take 1 tablet (81 mg total) by mouth daily.  Marland Kitchen buPROPion (WELLBUTRIN XL) 300 MG 24 hr tablet TAKE 1 TABLET BY MOUTH ONCE DAILY **  PT  NEEDS  APPOINTMENT  **  . Cholecalciferol (VITAMIN D3) 25 MCG (1000 UT) CAPS Take 1 capsule by mouth daily.  . clopidogrel (PLAVIX) 75 MG tablet Take 1 tablet by mouth once daily  . lisinopril (ZESTRIL) 10 MG tablet TAKE 1 TABLET BY MOUTH ONCE DAILY . APPOINTMENT REQUIRED FOR FUTURE REFILLS  . meclizine (ANTIVERT) 25 MG tablet Take 1 tablet (25 mg total) by mouth 3  (three) times daily as needed for dizziness.  . metoprolol succinate (TOPROL-XL) 50 MG 24 hr tablet TAKE 1 TABLET BY MOUTH ONCE DAILY WITH A MEAL OR  IMMEDIATELY  FOLLOWING  A  MEAL.  (APPOINTMENT  NEEDED)  . pantoprazole (PROTONIX) 40 MG tablet TAKE 1 TABLET BY MOUTH ONCE DAILY **PATIENT  NEEDS  AN  APPOINTMENT**  . traZODone (DESYREL) 50 MG tablet Take 0.5-1 tablets (25-50 mg total) by mouth at bedtime as needed for sleep. TAKE 1/2 TO 1 (ONE-HALF TO ONE) TABLET BY MOUTH AT BEDTIME AS NEEDED  . vitamin B-12 (CYANOCOBALAMIN) 1000 MCG tablet Take 1,000 mcg by mouth daily.   No facility-administered encounter medications on file as of 03/06/2020.    Allergies (verified) Sulfa antibiotics   History: Past Medical History:  Diagnosis Date  . Aorto-iliac atherosclerosis ([place])    last aaa duplex 08-30-2015  >50% stenosis bilateral common iliac arteries and left external iliac artery  . Arrhythmia   . Carotid stenosis, bilateral    cardiologist-- dr Stanford Breed ([place] 08-06-2015)  04-29-2017 followed by pcp/  last duplex 03-08-2017 bilateral ICA 40-59% post endarterectomy,  right ECA occluded  . Cerebral vascular disease   . Chronic gastritis   . Elevated cholesterol   . Esophageal stricture   . Full dentures   . GERD (gastroesophageal reflux disease)   . Hiatal hernia   . History of aortic  valve disorder    per cardiology note dated 08-06-2015 by dr Stanford Breed pt has hx small fibroblastoma aortic valve per echo 2008 /  last echo 08-23-2015 no fibroblastoma noted or stenosis or thickened/ calcification  . History of gastric ulcer 2013  . History of nuclear stress test 06-17-2006   per dr Stanford Breed note dated 08-06-2015 , normal study  . History of transient ischemic attack (TIA) 04/25/2006   severe left ICA stenosis/   04-29-2017 per pt no residual  . HLD (hyperlipidemia)   . HTN (hypertension)   . IBS (irritable bowel syndrome)   . OA (osteoarthritis)   . Pneumonia   . Prolapsed internal  hemorrhoids, grade 3   . PVD (peripheral vascular disease) ([place])   . Rectal bleed   . SYMPTOM, ECCHYMOSES, SPONTANEOUS 06/16/2006   Qualifier: Diagnosis of  By: Leanne Chang MD, [place]    . Vertigo   . Wears contact lenses    Past Surgical History:  Procedure Laterality Date  . CAROTID ENDARTERECTOMY Bilateral left 04-29-2006 and right 08-17-2007 by  dr c. Scot Dock  [facility]  . HEMORRHOID SURGERY N/A 05/07/2017   Procedure: ANAL EXAM UNDER ANESTHESIA, HEMORRHOIDECTOMY;  Surgeon: Leighton Ruff, MD;  Location: [facility];  Service: General;  Laterality: N/A;  . NASAL SINUS SURGERY  age 45  . TONSILLECTOMY  age 51  . TRANSTHORACIC ECHOCARDIOGRAM  08/23/2015   ef 99-24%, grade 1 diastolic dysfunction/ mild AR and MR  . VASCULAR SURGERY Bilateral    carotids  . WRIST GANGLION EXCISION Right yrs ago   Family History  Problem Relation Age of Onset  . Stroke Father   . Diabetes Mother   . Asthma Mother   . Heart disease Brother   . Diabetes Other   . Breast cancer Other   . Rheum arthritis Sister   . Colon cancer Neg Hx    Social History   Socioeconomic History  . Marital status: Widowed    Spouse name: Not on file  . Number of children: 0  . Years of education: 21  . Highest education level: Associate degree: occupational, Hotel manager, or vocational program  Occupational History  . Occupation: [employer]    Comment: retired  Tobacco Use  . Smoking status: Former Smoker    Packs/day: 0.50    Years: 30.00    Pack years: 15.00    Types: Cigarettes    Quit date: 04/02/2012    Years since quitting: 7.9  . Smokeless tobacco: Never Used  Vaping Use  . Vaping Use: Former  Substance and Sexual Activity  . Alcohol use: No  . Drug use: No  . Sexual activity: Not on file  Other Topics Concern  . Not on file  Social History Narrative    widow   Worked for [employer].       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[employer], substitute for [place] important part of her life   Has three living siblings, local, good support Psychologist, occupational at nursing facilities with hospice   Social Determinants of Health   Financial Resource Strain: [place]   . Difficulty of Paying Living Expenses: Not hard at all  Food Insecurity: No Food Insecurity  . Worried About Charity fundraiser in the Last Year: Never true  . Ran Out of Food in the Last Year: Never true  Transportation Needs: No Transportation Needs  .  Lack of Transportation (Medical): No  . Lack of Transportation (Non-Medical): No  Physical Activity: Insufficiently Active  . Days of Exercise per Week: 3 days  . Minutes of Exercise per Session: 30 min  Stress: No Stress Concern Present  . Feeling of Stress : Not at all  Social Connections: Moderately Isolated  . Frequency of Communication with Friends and Family: More than three times a week  . Frequency of Social Gatherings with Friends and Family: More than three times a week  . Attends Religious Services: More than 4 times per year  . Active Member of Clubs or Organizations: No  . Attends Archivist Meetings: Never  . Marital Status: Widowed    Tobacco Counseling Counseling given: Not Answered   Clinical Intake:  Pre-visit preparation completed: Yes  Pain : 0-10 Pain Score: 8  Pain Type: Chronic pain Pain Location: Elbow Pain Orientation: Right Pain Descriptors / Indicators: Aching Pain Onset: More than a month ago Pain Frequency: Intermittent Pain Relieving Factors: aspercream  Pain Relieving Factors: aspercream  Nutritional Risks: None Diabetes: No  How often do you need to have someone help you when you read instructions, pamphlets, or other written materials from your doctor or pharmacy?: 1 - Never What is the last grade level you completed in school?: College  Diabetic?No   Interpreter Needed?: No  Information  entered by :: [place] of Daily Living In your present state of health, do you have any difficulty performing the following activities: 03/06/2020  Hearing? Y  Comment has some issues with hearing aids  Vision? N  Difficulty concentrating or making decisions? N  Walking or climbing stairs? N  Dressing or bathing? N  Doing errands, shopping? N  Preparing Food and eating ? N  Using the Toilet? N  In the past six months, have you accidently leaked urine? N  Do you have problems with loss of bowel control? N  Managing your Medications? N  Managing your Finances? N  Housekeeping or managing your Housekeeping? N  Some recent data might be hidden    Patient Care Team: Caren Macadam, MD as PCP - General (Family Medicine) Stanford Breed Denice Bors, MD as Consulting Physician (Cardiology) Belva Bertin (Ophthalmology)  Indicate any recent Medical Services you may have received from other than [facility] providers in the past year (date may be approximate).     Assessment:   This is a routine wellness examination for [place].  Hearing/Vision screen  Hearing Screening   125Hz  250Hz  500Hz  1000Hz  2000Hz  3000Hz  4000Hz  6000Hz  8000Hz   Right ear:           Left ear:           Vision Screening Comments: Patient states gets eyes examined every 2 years    Dietary issues and exercise activities discussed: Current Exercise Habits: Home exercise routine, Type of exercise: walking;strength training/weights, Time (Minutes): 30, Frequency (Times/Week): 3, Weekly Exercise (Minutes/Week): 90, Intensity: Mild  Goals    . Patient Stated     Improvement in GI problems    . Patient Stated     I will continue to exercise 3x per week by lifting light weights and walk 30 minutes.      Depression Screen PHQ 2/9 Scores 03/06/2020 03/06/2019 03/02/2018 03/02/2018  PHQ - 2 Score 0 0 0 0  PHQ- 9 Score - - 0 5    Fall Risk Fall Risk  03/06/2020 03/06/2019 03/02/2018  Falls in the past year? 1 0 0  Number  falls in past yr: 0 - -  Injury with Fall? 0 - -  Risk for fall due to : No Fall Risks - -  Follow up Falls evaluation completed;Falls prevention discussed Falls evaluation completed;Education provided;Falls prevention discussed -    FALL RISK PREVENTION PERTAINING TO THE HOME:  Any stairs in or around the home? No  If so, are there any without handrails? No  Home free of loose throw rugs in walkways, pet beds, electrical cords, etc? Yes  Adequate lighting in your home to reduce risk of falls? Yes   ASSISTIVE DEVICES UTILIZED TO PREVENT FALLS:  Life alert? No  Use of a cane, walker or w/c? No  Grab bars in the bathroom? No  Shower chair or bench in shower? No  Elevated toilet seat or a handicapped toilet? No   TIMED UP AND GO:  Was the test performed? Yes .  Length of time to ambulate 10 feet: 4 sec.   Gait steady and fast without use of assistive device  Cognitive Function: Normal cognitive status assessed by direct observation by this Nurse Health Advisor. No abnormalities found.       6CIT Screen 03/06/2019  What Year? 0 points  What month? 0 points  What time? 0 points  Count back from 20 0 points  Months in reverse 0 points  Repeat phrase 0 points  Total Score 0    Immunizations Immunization History  Administered Date(s) Administered  . Influenza-Unspecified 01/26/2018, 12/27/2019  . PFIZER(Purple Top)SARS-COV-2 Vaccination 01/04/2020, 01/25/2020  . Pneumococcal Conjugate-13 12/11/2013, 03/02/2018  . Pneumococcal Polysaccharide-23 01/24/2015  . Tdap 01/26/2009  . Zoster Recombinat (Shingrix) 03/02/2018, 09/02/2018    TDAP status: Due, Education has been provided regarding the importance of this vaccine. Advised may receive this vaccine at local pharmacy or Health Dept. Aware to provide a copy of the vaccination record if obtained from local pharmacy or Health Dept. Verbalized acceptance and understanding.  Flu Vaccine status: Up to date  Pneumococcal  vaccine status: Up to date  Covid-19 vaccine status: Completed vaccines  Qualifies for Shingles Vaccine? Yes   Zostavax completed No   Shingrix Completed?: Yes  Screening Tests Health Maintenance  Topic Date Due  . TETANUS/TDAP  01/27/2019  . COVID-19 Vaccine (3 - Booster for Pfizer series) 07/25/2020  . COLONOSCOPY (Pts 45-51yrs Insurance coverage will need to be confirmed)  06/18/2021  . INFLUENZA VACCINE  Completed  . DEXA SCAN  Completed  . Hepatitis C Screening  Completed  . PNA vac Low Risk Adult  Completed  . MAMMOGRAM  Discontinued    Health Maintenance  Health Maintenance Due  Topic Date Due  . TETANUS/TDAP  01/27/2019    Colorectal cancer screening: Type of screening: Colonoscopy. Completed 06/19/2011. Repeat every 10 years  Mammogram status: No longer required due to patient preference .  Bone Density status: Completed 03/20/2019. Results reflect: Bone density results: OSTEOPENIA. Repeat every 2 years.  Lung Cancer Screening: (Low Dose CT Chest recommended if Age 110-80 years, 30 pack-year currently smoking OR have quit w/in 15years.) does not qualify.   Lung Cancer Screening Referral: N/A   Additional Screening:  Hepatitis C Screening: does qualify; Completed 03/09/2018   Vision Screening: Recommended annual ophthalmology exams for early detection of glaucoma and other disorders of the eye. Is the patient up to date with their annual eye exam?  Yes  Who is the provider or what is the name of the office in which the patient attends annual eye exams?  Dr. Sabra Heck If pt is not established with a provider, would they like to be referred to a provider to establish care? No .   Dental Screening: Recommended annual dental exams for proper oral hygiene  Community Resource Referral / Chronic Care Management: CRR required this visit?  No   CCM required this visit?  No      Plan:     I have personally reviewed and noted the following in the patient's chart:    . Medical and social history . Use of alcohol, tobacco or illicit drugs  . Current medications and supplements . Functional ability and status . Nutritional status . Physical activity . Advanced directives . List of other physicians . Hospitalizations, surgeries, and ER visits in previous 12 months . Vitals . Screenings to include cognitive, depression, and falls . Referrals and appointments  In addition, I have reviewed and discussed with patient certain preventive protocols, quality metrics, and best practice recommendations. A written personalized care plan for preventive services as well as general preventive health recommendations were provided to patient.     Ofilia Neas, LPN   03/03/3333   Nurse Notes: None

## 2020-03-06 NOTE — Research (Signed)
Opened in error

## 2020-03-06 NOTE — Research (Signed)
Subject Name: [name] [name]  Subject met inclusion and exclusion criteria.  The informed consent form, study requirements and expectations were reviewed with the subject and questions and concerns were addressed prior to the signing of the consent form.  The subject verbalized understanding of the trial requirements.  The subject agreed to participate in the [place] 8 trial and signed the informed consent at 1305 on 03/06/20  The informed consent was obtained prior to performance of any protocol-specific procedures for the subject.  A copy of the signed informed consent was given to the subject and a copy was placed in the subject's medical record.   [name]  Re-Consent to [place] 8 Version 2 16 Mar 2019

## 2020-03-06 NOTE — Patient Instructions (Signed)
[name] [name] , Thank you for taking time to come for your Medicare Wellness Visit. I appreciate your ongoing commitment to your health goals. Please review the following plan we discussed and let me know if I can assist you in the future.   Screening recommendations/referrals: Colonoscopy: Up to date, next due 06/18/2021 Mammogram: No longer required  Bone Density: Up to date, next due 03/19/2021 Recommended yearly ophthalmology/optometry visit for glaucoma screening and checkup Recommended yearly dental visit for hygiene and checkup  Vaccinations: Influenza vaccine: Up to date, next due fall 2022  Pneumococcal vaccine: Completed series Tdap vaccine: Currently due, you may await and injury to receive as it will be covered by your insurance at that time Shingles vaccine: Completed series    Advanced directives: Advance directive discussed with you today. Even though you declined this today please call our office should you change your mind and we can give you the proper paperwork for you to fill out.   Conditions/risks identified: None   Next appointment: None    Preventive Care 65 Years and Older, Female Preventive care refers to lifestyle choices and visits with your health care provider that can promote health and wellness. What does preventive care include?  A yearly physical exam. This is also called an annual well check.  Dental exams once or twice a year.  Routine eye exams. Ask your health care provider how often you should have your eyes checked.  Personal lifestyle choices, including:  Daily care of your teeth and gums.  Regular physical activity.  Eating a healthy diet.  Avoiding tobacco and drug use.  Limiting alcohol use.  Practicing safe sex.  Taking low-dose aspirin every day.  Taking vitamin and mineral supplements as recommended by your health care provider. What happens during an annual well check? The services and screenings done by your health care  provider during your annual well check will depend on your age, overall health, lifestyle risk factors, and family history of disease. Counseling  Your health care provider may ask you questions about your:  Alcohol use.  Tobacco use.  Drug use.  Emotional well-being.  Home and relationship well-being.  Sexual activity.  Eating habits.  History of falls.  Memory and ability to understand (cognition).  Work and work Statistician.  Reproductive health. Screening  You may have the following tests or measurements:  Height, weight, and BMI.  Blood pressure.  Lipid and cholesterol levels. These may be checked every 5 years, or more frequently if you are over 53 years old.  Skin check.  Lung cancer screening. You may have this screening every year starting at age 54 if you have a 30-pack-year history of smoking and currently smoke or have quit within the past 15 years.  Fecal occult blood test (FOBT) of the stool. You may have this test every year starting at age 56.  Flexible sigmoidoscopy or colonoscopy. You may have a sigmoidoscopy every 5 years or a colonoscopy every 10 years starting at age 75.  Hepatitis C blood test.  Hepatitis B blood test.  Sexually transmitted disease (STD) testing.  Diabetes screening. This is done by checking your blood sugar (glucose) after you have not eaten for a while (fasting). You may have this done every 1-3 years.  Bone density scan. This is done to screen for osteoporosis. You may have this done starting at age 25.  Mammogram. This may be done every 1-2 years. Talk to your health care provider about how often you should have  regular mammograms. Talk with your health care provider about your test results, treatment options, and if necessary, the need for more tests. Vaccines  Your health care provider may recommend certain vaccines, such as:  Influenza vaccine. This is recommended every year.  Tetanus, diphtheria, and acellular  pertussis (Tdap, Td) vaccine. You may need a Td booster every 10 years.  Zoster vaccine. You may need this after age 38.  Pneumococcal 13-valent conjugate (PCV13) vaccine. One dose is recommended after age 78.  Pneumococcal polysaccharide (PPSV23) vaccine. One dose is recommended after age 36. Talk to your health care provider about which screenings and vaccines you need and how often you need them. This information is not intended to replace advice given to you by your health care provider. Make sure you discuss any questions you have with your health care provider. Document Released: 02/08/2015 Document Revised: 10/02/2015 Document Reviewed: 11/13/2014 Elsevier Interactive Patient Education  2017 [place] Prevention in the Home Falls can cause injuries. They can happen to people of all ages. There are many things you can do to make your home safe and to help prevent falls. What can I do on the outside of my home?  Regularly fix the edges of walkways and driveways and fix any cracks.  Remove anything that might make you trip as you walk through a door, such as a raised step or threshold.  Trim any bushes or trees on the path to your home.  Use bright outdoor lighting.  Clear any walking paths of anything that might make someone trip, such as rocks or tools.  Regularly check to see if handrails are loose or broken. Make sure that both sides of any steps have handrails.  Any raised decks and porches should have guardrails on the edges.  Have any leaves, snow, or ice cleared regularly.  Use sand or salt on walking paths during winter.  Clean up any spills in your garage right away. This includes oil or grease spills. What can I do in the bathroom?  Use night lights.  Install grab bars by the toilet and in the tub and shower. Do not use towel bars as grab bars.  Use non-skid mats or decals in the tub or shower.  If you need to sit down in the shower, use a plastic,  non-slip stool.  Keep the floor dry. Clean up any water that spills on the floor as soon as it happens.  Remove soap buildup in the tub or shower regularly.  Attach bath mats securely with double-sided non-slip rug tape.  Do not have throw rugs and other things on the floor that can make you trip. What can I do in the bedroom?  Use night lights.  Make sure that you have a light by your bed that is easy to reach.  Do not use any sheets or blankets that are too big for your bed. They should not hang down onto the floor.  Have a firm chair that has side arms. You can use this for support while you get dressed.  Do not have throw rugs and other things on the floor that can make you trip. What can I do in the kitchen?  Clean up any spills right away.  Avoid walking on wet floors.  Keep items that you use a lot in easy-to-reach places.  If you need to reach something above you, use a strong step stool that has a grab bar.  Keep electrical cords out of the  way.  Do not use floor polish or wax that makes floors slippery. If you must use wax, use non-skid floor wax.  Do not have throw rugs and other things on the floor that can make you trip. What can I do with my stairs?  Do not leave any items on the stairs.  Make sure that there are handrails on both sides of the stairs and use them. Fix handrails that are broken or loose. Make sure that handrails are as long as the stairways.  Check any carpeting to make sure that it is firmly attached to the stairs. Fix any carpet that is loose or worn.  Avoid having throw rugs at the top or bottom of the stairs. If you do have throw rugs, attach them to the floor with carpet tape.  Make sure that you have a light switch at the top of the stairs and the bottom of the stairs. If you do not have them, ask someone to add them for you. What else can I do to help prevent falls?  Wear shoes that:  Do not have high heels.  Have rubber  bottoms.  Are comfortable and fit you well.  Are closed at the toe. Do not wear sandals.  If you use a stepladder:  Make sure that it is fully opened. Do not climb a closed stepladder.  Make sure that both sides of the stepladder are locked into place.  Ask someone to hold it for you, if possible.  Clearly mark and make sure that you can see:  Any grab bars or handrails.  First and last steps.  Where the edge of each step is.  Use tools that help you move around (mobility aids) if they are needed. These include:  Canes.  Walkers.  Scooters.  Crutches.  Turn on the lights when you go into a dark area. Replace any light bulbs as soon as they burn out.  Set up your furniture so you have a clear path. Avoid moving your furniture around.  If any of your floors are uneven, fix them.  If there are any pets around you, be aware of where they are.  Review your medicines with your doctor. Some medicines can make you feel dizzy. This can increase your chance of falling. Ask your doctor what other things that you can do to help prevent falls. This information is not intended to replace advice given to you by your health care provider. Make sure you discuss any questions you have with your health care provider. Document Released: 11/08/2008 Document Revised: 06/20/2015 Document Reviewed: 02/16/2014 Elsevier Interactive Patient Education  2017 [employer].

## 2020-03-07 ENCOUNTER — Other Ambulatory Visit: Payer: Medicare PPO

## 2020-03-18 ENCOUNTER — Other Ambulatory Visit: Payer: Self-pay | Admitting: Family Medicine

## 2020-03-18 DIAGNOSIS — I10 Essential (primary) hypertension: Secondary | ICD-10-CM

## 2020-03-21 ENCOUNTER — Other Ambulatory Visit (HOSPITAL_COMMUNITY): Payer: Self-pay | Admitting: Cardiology

## 2020-03-21 ENCOUNTER — Other Ambulatory Visit: Payer: Self-pay

## 2020-03-21 ENCOUNTER — Ambulatory Visit (HOSPITAL_COMMUNITY)
Admission: RE | Admit: 2020-03-21 | Discharge: 2020-03-21 | Disposition: A | Discharge status: Home / Self Care | Payer: Medicare PPO | Source: Ambulatory Visit | Attending: Cardiovascular Disease | Admitting: Cardiovascular Disease

## 2020-03-21 DIAGNOSIS — I6523 Occlusion and stenosis of bilateral carotid arteries: Secondary | ICD-10-CM

## 2020-03-21 DIAGNOSIS — I679 Cerebrovascular disease, unspecified: Secondary | ICD-10-CM | POA: Diagnosis not present

## 2020-03-22 ENCOUNTER — Encounter: Payer: Self-pay | Admitting: *Deleted

## 2020-04-11 ENCOUNTER — Telehealth: Payer: Self-pay | Admitting: Family Medicine

## 2020-04-11 ENCOUNTER — Other Ambulatory Visit: Payer: Self-pay

## 2020-04-11 NOTE — Telephone Encounter (Signed)
Pt is calling in stating that she would like to speak with Dr. Ethlyn Gallery about something that they had spoke about.  Pt is stating that she thinks that she had a gall bladder attack on Monday 04/08/2020 and want to talk about something that happened during that time.  Pt has made an appointment to go over this on 04/12/2020 at 9:30.

## 2020-04-12 ENCOUNTER — Encounter: Payer: Self-pay | Admitting: Family Medicine

## 2020-04-12 ENCOUNTER — Ambulatory Visit: Payer: Medicare PPO | Admitting: Family Medicine

## 2020-04-12 VITALS — BP 122/80 | HR 70 | Temp 98.2°F | Ht 61.0 in | Wt 144.8 lb

## 2020-04-12 DIAGNOSIS — R1111 Vomiting without nausea: Secondary | ICD-10-CM | POA: Diagnosis not present

## 2020-04-12 DIAGNOSIS — R1084 Generalized abdominal pain: Secondary | ICD-10-CM

## 2020-04-12 LAB — COMPREHENSIVE METABOLIC PANEL
ALT: 17 U/L (ref 0–35)
AST: 21 U/L (ref 0–37)
Albumin: 4.4 g/dL (ref 3.5–5.2)
Alkaline Phosphatase: 100 U/L (ref 39–117)
BUN: 15 mg/dL (ref 6–23)
CO2: 28 mEq/L (ref 19–32)
Calcium: 9.7 mg/dL (ref 8.4–10.5)
Chloride: 106 mEq/L (ref 96–112)
Creatinine, Ser: 1.06 mg/dL (ref 0.40–1.20)
GFR: 51.19 mL/min — ABNORMAL LOW (ref >60.00)
Glucose, Bld: 95 mg/dL (ref 70–99)
Potassium: 4.8 mEq/L (ref 3.5–5.1)
Sodium: 143 mEq/L (ref 135–145)
Total Bilirubin: 0.3 mg/dL (ref 0.2–1.2)
Total Protein: 6.9 g/dL (ref 6.0–8.3)

## 2020-04-12 LAB — CBC WITH DIFFERENTIAL/PLATELET
Basophils Absolute: 0.1 10*3/uL (ref 0.0–0.1)
Basophils Relative: 1.1 % (ref 0.0–3.0)
Eosinophils Absolute: 0.1 10*3/uL (ref 0.0–0.7)
Eosinophils Relative: 1.8 % (ref 0.0–5.0)
HCT: 39.8 % (ref 36.0–46.0)
Hemoglobin: 13.5 g/dL (ref 12.0–15.0)
Lymphocytes Relative: 29.1 % (ref 12.0–46.0)
Lymphs Abs: 1.3 10*3/uL (ref 0.7–4.0)
MCHC: 33.9 g/dL (ref 30.0–36.0)
MCV: 88 fl (ref 78.0–100.0)
Monocytes Absolute: 0.4 10*3/uL (ref 0.1–1.0)
Monocytes Relative: 9.2 % (ref 3.0–12.0)
Neutro Abs: 2.6 10*3/uL (ref 1.4–7.7)
Neutrophils Relative %: 58.8 % (ref 43.0–77.0)
Platelets: 238 10*3/uL (ref 150.0–400.0)
RBC: 4.52 Mil/uL (ref 3.87–5.11)
RDW: 13.5 % (ref 11.5–15.5)
WBC: 4.5 10*3/uL (ref 4.0–10.5)

## 2020-04-12 LAB — LIPASE: Lipase: 14 U/L (ref 11.0–59.0)

## 2020-04-12 LAB — AMYLASE: Amylase: 31 U/L (ref 27–131)

## 2020-04-12 NOTE — Telephone Encounter (Signed)
Patient seen today in office

## 2020-04-12 NOTE — Progress Notes (Signed)
[name] [name] DOB: 01-Mar-1945 Encounter date: 04/12/2020  This is a 75 y.o. female who presents with abdominal pain   History of present illness: Here today with concerns for gall bladder. Had shoulder pain, chest pain, light urine, back pain, heartburn, gassy, nausea and vomiting, diarrhea.   Pain woke her up on Monday morning. Couldn't get comfortable; couldn't walk. Used to have air pockets and could get those to go away. Didn't feel like acid reflux. Even took extra dose of acid reflux and took dose of nexium without relief. Tried to walk and thought it would improve, but it didn't. Had to pull pajama bottom down because waist pressing on upper belly hurt her. Felt pain from upper belly down to lower. Stomach got so big that top got tight. Hurt just to touch. Monday and Tuesday all day long; but then weds after lunch started to ease up. Couldn't eat; just ate apple sauce, potatoes; but would hurt after small amount (just spoons) of food. Nauseated, vomit looked like chunks of meat. Stool looked the same. No color to urine now - was yellow Monday/tues. Belly still hurts. Monday was 10/10; today states 6. Last vomiting was Tuesday evening. Stools light brown, small, flaky. Did have diarrhea all day Monday/tuesday. No one around her sick; no unusual foods/drinks outside her norm. No restaurants within days of this episode. (just milkshake)  No fevers, no chills, shaking. No new breathing issues/no shortness of breath acutely.   Allergies  Allergen Reactions  . Sulfa Antibiotics Swelling   Current Meds  Medication Sig  . AMBULATORY NON FORMULARY MEDICATION Inject 300 mg into the skin See admin instructions. Inclisiran Sodium - Inject into the skin as directed. Per pt in [place] cardiology research trial Next appt in May 2021  . aspirin EC 81 MG tablet Take 1 tablet (81 mg total) by mouth daily.  Marland Kitchen buPROPion (WELLBUTRIN XL) 300 MG 24 hr tablet TAKE 1 TABLET BY MOUTH ONCE DAILY **  PT   NEEDS  APPOINTMENT  **  . Cholecalciferol (VITAMIN D3) 25 MCG (1000 UT) CAPS Take 1 capsule by mouth daily.  . clopidogrel (PLAVIX) 75 MG tablet Take 1 tablet by mouth once daily  . lisinopril (ZESTRIL) 10 MG tablet Take 1 tablet (10 mg total) by mouth daily.  . meclizine (ANTIVERT) 25 MG tablet Take 1 tablet (25 mg total) by mouth 3 (three) times daily as needed for dizziness.  . metoprolol succinate (TOPROL-XL) 50 MG 24 hr tablet TAKE 1 TABLET BY MOUTH ONCE DAILY WITH A MEAL OR  IMMEDIATELY  FOLLOWING  A  MEAL.  (APPOINTMENT  NEEDED)  . pantoprazole (PROTONIX) 40 MG tablet TAKE 1 TABLET BY MOUTH ONCE DAILY **PATIENT  NEEDS  AN  APPOINTMENT**  . traZODone (DESYREL) 50 MG tablet Take 0.5-1 tablets (25-50 mg total) by mouth at bedtime as needed for sleep. TAKE 1/2 TO 1 (ONE-HALF TO ONE) TABLET BY MOUTH AT BEDTIME AS NEEDED  . vitamin B-12 (CYANOCOBALAMIN) 1000 MCG tablet Take 1,000 mcg by mouth daily.    Review of Systems  Constitutional: Negative for chills, fatigue and fever.  Respiratory: Negative for cough, chest tightness, shortness of breath and wheezing.   Cardiovascular: Negative for chest pain, palpitations and leg swelling.  Gastrointestinal: Positive for abdominal distention, abdominal pain, diarrhea, nausea and vomiting. Negative for blood in stool and constipation.    Objective:  BP 122/80 (BP Location: Left Arm, Patient Position: Sitting, Cuff Size: Normal)   Pulse 70   Temp 98.2  F (36.8 C) (Oral)   Ht 5\' 1"  (1.549 m)   Wt 144 lb 12.8 oz (65.7 kg)   SpO2 97%   BMI 27.36 kg/m   Weight: 144 lb 12.8 oz (65.7 kg)   BP Readings from Last 3 Encounters:  04/12/20 122/80  03/06/20 (!) 144/82  12/14/19 (!) 161/54   Wt Readings from Last 3 Encounters:  04/12/20 144 lb 12.8 oz (65.7 kg)  03/06/20 148 lb 3 oz (67.2 kg)  02/22/20 148 lb (67.1 kg)    Physical Exam Constitutional:      General: She is not in acute distress.    Appearance: She is well-developed.   Cardiovascular:     Rate and Rhythm: Normal rate and regular rhythm.     Heart sounds: Normal heart sounds. No murmur heard. No friction rub.  Pulmonary:     Effort: Pulmonary effort is normal. No respiratory distress.     Breath sounds: Normal breath sounds. No wheezing or rales.  Abdominal:     General: Bowel sounds are normal.     Tenderness: There is generalized abdominal tenderness. There is guarding (diffuse, mild). There is no right CVA tenderness, left CVA tenderness or rebound. Negative signs include Murphy's sign and McBurney's sign.     Hernia: No hernia is present.  Musculoskeletal:     Right lower leg: No edema.     Left lower leg: No edema.  Neurological:     Mental Status: She is alert and oriented to person, place, and time.  Psychiatric:        Behavior: Behavior normal.     Assessment/Plan  1. Generalized abdominal pain Start with bloodwork; follow up eval pending her progress and these results. Would get CT if any worsening of symptoms or if not improving. I suspect acute viral infection due to rapid onset of symptoms and improvement at this point. Of note, her husband died from ruptured gallbladder/sepsis so she keeps that thought on her mind. - CBC with Differential/Platelet; Future - Comprehensive metabolic panel; Future - Lipase; Future - Amylase; Future - Hepatitis Acute Panel; Future - Urinalysis with Culture Reflex  2. Intractable vomiting without nausea, unspecified vomiting type Vomiting has stopped; she was cautioned to go to ER if any worsening for further evaluation.    Return for pending lab results/symptoms.    Micheline Rough, MD

## 2020-04-17 NOTE — Progress Notes (Signed)
Noted  

## 2020-04-18 LAB — HEPATITIS PANEL, ACUTE
Hep A IgM: NONREACTIVE
Hep B C IgM: NONREACTIVE
Hepatitis B Surface Ag: NONREACTIVE
Hepatitis C Ab: NONREACTIVE
SIGNAL TO CUT-OFF: 0.01 (ref <1.00)

## 2020-04-18 LAB — SARS-COV-2 SEMI-QUANTITATIVE TOTAL ANTIBODY, SPIKE: SARS COV2 AB, Total Spike Semi QN: 895 U/mL — ABNORMAL HIGH (ref <0.8)

## 2020-04-19 LAB — URINALYSIS W MICROSCOPIC + REFLEX CULTURE
Bacteria, UA: NONE SEEN /HPF
Bilirubin Urine: NEGATIVE
Glucose, UA: NEGATIVE
Hgb urine dipstick: NEGATIVE
Hyaline Cast: NONE SEEN /LPF
Nitrites, Initial: NEGATIVE
Protein, ur: NEGATIVE
RBC / HPF: NONE SEEN /HPF (ref 0–2)
Specific Gravity, Urine: 1.025 (ref 1.001–1.03)
Squamous Epithelial / HPF: NONE SEEN /HPF (ref <=5)
WBC, UA: NONE SEEN /HPF (ref 0–5)
pH: <=5 (ref 5.0–8.0)

## 2020-04-19 LAB — URINE CULTURE
MICRO NUMBER:: 11667190
Result:: NO GROWTH
SPECIMEN QUALITY:: ADEQUATE

## 2020-04-19 LAB — CULTURE INDICATED

## 2020-04-19 LAB — HEPATITIS PANEL, ACUTE

## 2020-04-29 ENCOUNTER — Other Ambulatory Visit: Payer: Self-pay | Admitting: Family Medicine

## 2020-05-01 ENCOUNTER — Other Ambulatory Visit: Payer: Self-pay | Admitting: Family Medicine

## 2020-05-01 DIAGNOSIS — K219 Gastro-esophageal reflux disease without esophagitis: Secondary | ICD-10-CM

## 2020-05-03 NOTE — Telephone Encounter (Signed)
If she is taking protonix still, then she doesn't need this as well? Please clarify with patient. Not sure if it got switched due to insurance issue. thanks

## 2020-05-07 NOTE — Telephone Encounter (Signed)
Spoke with the pt and she stated she no longer takes the medication and a denial was sent to the pharmacy.

## 2020-05-13 ENCOUNTER — Other Ambulatory Visit: Payer: Self-pay | Admitting: Family Medicine

## 2020-05-27 ENCOUNTER — Other Ambulatory Visit: Payer: Self-pay | Admitting: Family Medicine

## 2020-05-27 DIAGNOSIS — K219 Gastro-esophageal reflux disease without esophagitis: Secondary | ICD-10-CM

## 2020-06-24 ENCOUNTER — Other Ambulatory Visit: Payer: Self-pay | Admitting: Family Medicine

## 2020-06-25 ENCOUNTER — Encounter: Payer: Medicare PPO | Admitting: *Deleted

## 2020-06-25 ENCOUNTER — Other Ambulatory Visit: Payer: Self-pay

## 2020-06-25 VITALS — BP 154/70 | HR 72 | Temp 98.6°F | Resp 18 | Wt 147.0 lb

## 2020-06-25 DIAGNOSIS — Z006 Encounter for examination for normal comparison and control in clinical research program: Secondary | ICD-10-CM

## 2020-06-25 NOTE — Research (Signed)
Subject came into the research clinic today for their Day 990 Visit in the [facility]. All concomitant medications have been reviewed and updated if applicable. There are no new AE's or SAE's to report to sponsor at this time. Subject received their IP injection subcutaneously into their right abdomen at 0941 and remained in research clinic until 1011, subject had no complaints. Subject's next appointment scheduled for Tuesday, October 18th, 2022 @ 0900am.

## 2020-08-08 ENCOUNTER — Other Ambulatory Visit: Payer: Self-pay | Admitting: Family Medicine

## 2020-09-06 ENCOUNTER — Other Ambulatory Visit: Payer: Self-pay | Admitting: Family Medicine

## 2020-09-06 DIAGNOSIS — I10 Essential (primary) hypertension: Secondary | ICD-10-CM

## 2020-09-06 DIAGNOSIS — K219 Gastro-esophageal reflux disease without esophagitis: Secondary | ICD-10-CM

## 2020-09-09 IMAGING — CT CT ANGIO NECK
1 of 8 series · 6 of 33 positions shown · IV contrast (omnipaque)
Comparison: MRI head 04/26/2006

CLINICAL DATA: Dizziness nausea



[Series 11: ax thin · axial · 0.56mm/px · z∈[+1420,+1645]mm · 6 of 315 slices shown]
[im 45/315  soft-tissue]
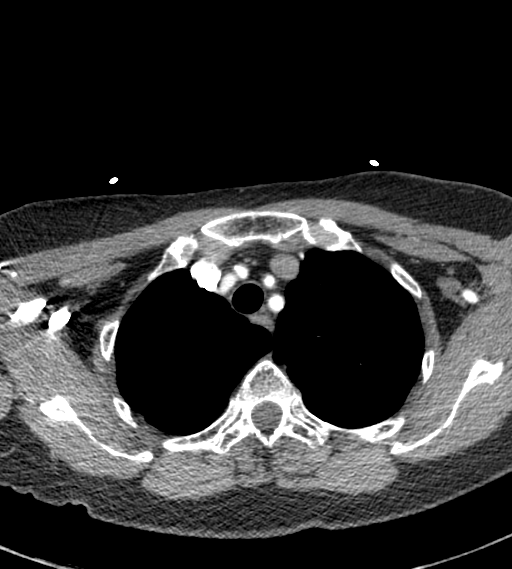
[im 90/315  bone]
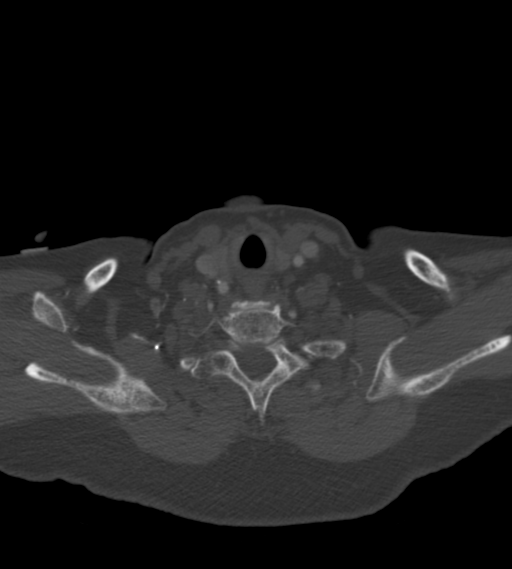
[im 135/315  soft-tissue]
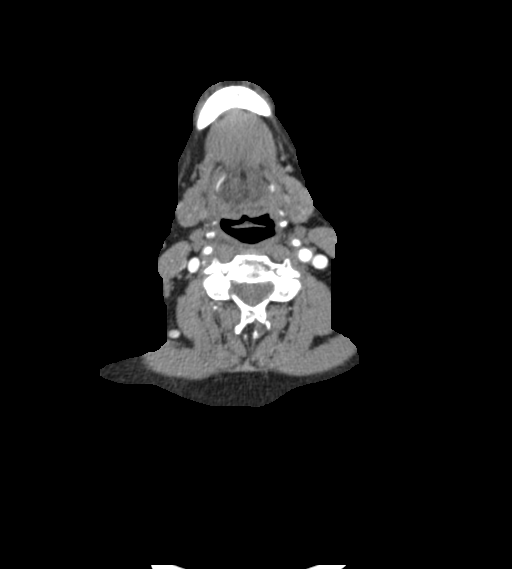
[im 180/315  bone]
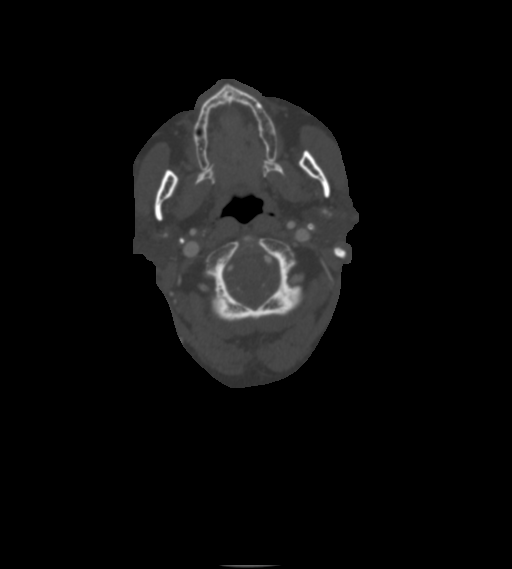
[im 225/315  soft-tissue]
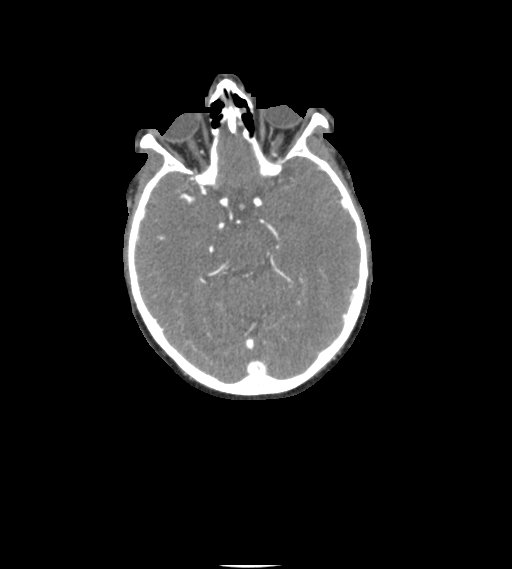
[im 270/315  bone]
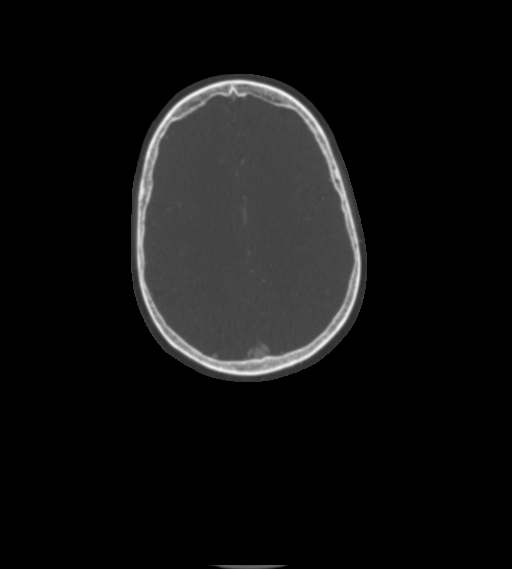

[6 of 33 positions shown; findings below may reference images not displayed]

FINDINGS: CT HEAD FINDINGS

Brain: Ventricle size normal. Hypodensity right frontal white matter
compatible with infarct of indeterminate age. Not present
previously. Scattered small white matter hypodensities bilaterally
have progressed. Hypodensity in the left basal ganglia compatible
with chronic infarct.

Negative for hemorrhage or mass. Ventricle size normal.

Vascular: Negative for hyperdense vessel

Skull: Negative

Sinuses: Negative

Orbits: Negative

Review of the MIP images confirms the above findings

CTA NECK FINDINGS

Aortic arch: Atherosclerotic aortic arch without aneurysm. Proximal
great vessels patent with diffuse atherosclerotic calcification

Right carotid system: Atherosclerotic calcification right common
carotid artery. Focal moderate stenosis right mid common carotid
artery. Atherosclerotic disease right carotid bifurcation with
severe stenosis at the origin of the right external carotid artery.
Mild stenosis proximal right internal carotid artery. Mild to
moderate stenosis distal right internal carotid artery.

Left carotid system: Mild atherosclerotic calcification left carotid
bifurcation. Mild stenosis proximal left internal carotid artery
with scattered calcification in the mid and distal left internal
carotid artery. Mild stenosis at the origin of the left external
carotid artery.

Vertebral arteries: Both vertebral arteries patent to the basilar
without stenosis

Skeleton: Cervical spondylosis. No acute skeletal abnormality.

Other neck: Negative for mass or adenopathy in the neck.

Upper chest: Mild apical emphysema. No acute abnormality. Lung
apices clear bilaterally.

Review of the MIP images confirms the above findings

CTA HEAD FINDINGS

Anterior circulation: Atherosclerotic calcification in the cavernous
carotid bilaterally with mild stenosis bilaterally. Anterior and
middle cerebral arteries patent bilaterally without stenosis or
large vessel occlusion.

Posterior circulation: Both vertebral arteries patent to the
basilar. PICA patent bilaterally. Basilar widely patent. AICA,
superior cerebellar, posterior cerebral arteries patent bilaterally.
Patent posterior communicating artery on the right.

Venous sinuses: Normal venous enhancement

Anatomic variants: None

Review of the MIP images confirms the above findings
IMPRESSION: 1. Hypodensity right frontal white matter compatible with acute
infarct of indeterminate age. Chronic infarct left basal ganglia.
2. Diffuse atherosclerotic disease in the carotid bifurcation
bilaterally. Mild stenosis proximal internal carotid artery
bilaterally. Severe stenosis origin of right external carotid
artery. Mild to moderate stenosis distal right internal carotid
artery.
3. Mild atherosclerotic calcification and stenosis proximal left
internal carotid artery.
4. Mild stenosis cavernous carotid bilaterally. No significant
intracranial stenosis.
5. Negative for intracranial large vessel occlusion.

Aortic Atherosclerosis ([id number].[id number]).

## 2020-11-08 ENCOUNTER — Other Ambulatory Visit: Payer: Self-pay | Admitting: Family Medicine

## 2020-11-11 ENCOUNTER — Other Ambulatory Visit (HOSPITAL_BASED_OUTPATIENT_CLINIC_OR_DEPARTMENT_OTHER): Payer: Self-pay | Admitting: Cardiology

## 2020-11-11 DIAGNOSIS — I6523 Occlusion and stenosis of bilateral carotid arteries: Secondary | ICD-10-CM

## 2020-11-14 ENCOUNTER — Other Ambulatory Visit: Payer: Self-pay

## 2020-11-14 DIAGNOSIS — Z006 Encounter for examination for normal comparison and control in clinical research program: Secondary | ICD-10-CM

## 2020-11-14 NOTE — Research (Signed)
Patient was seen and examined, and chart reviewed.  EOS-[facility]ion 8 PE form completed.  Not any other therapy, and has evidence of vascular disease with bilateral carotid endarterectomies.  Asked her to make an appointment to see Dr. Stanford Breed, and will send him a notice.   [name]. Lia Foyer, MD, [facility], [place] Director, [facility]

## 2020-11-14 NOTE — Research (Signed)
Subject came into research clinic today for their Day 1080 in the [facility] [employer]. All concomitant medications were reviewed and updated if applicable. Vitals, labs drawn. Subject came in with no complaints at this time.  Subject was examined by Dr.Stuckey. no additional study visits were made because this is the end of the study.

## 2020-11-18 NOTE — Progress Notes (Signed)
HPI: FU cerebrovascular disease. 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 She does not have CAD and had a normal nuclear study 06/17/2006. Abdominal ultrasound August 2017 showed greater than 50% bilateral common iliac arteries.  No aneurysm. Echocardiogram July 2020 showed normal LV function, grade 1 diastolic dysfunction. Monitor August 2020 showed no significant arrhythmias. Carotid Dopplers February 2022 showed 40 to 59% right status post carotid endarterectomy and 1 to 39% left status post carotid endarterectomy.  Since last seen, she denies dyspnea, chest pain, palpitations or syncope.  Current Outpatient Medications  Medication Sig Dispense Refill   AMBULATORY NON FORMULARY MEDICATION Inject 300 mg into the skin See admin instructions. Inclisiran Sodium - Inject into the skin as directed. Per pt in [place] cardiology research trial Next appt in May 2021     aspirin EC 81 MG tablet Take 1 tablet (81 mg total) by mouth daily. 90 tablet 3   buPROPion (WELLBUTRIN XL) 300 MG 24 hr tablet Take 1 tablet by mouth once daily 90 tablet 0   Cholecalciferol (VITAMIN D3) 25 MCG (1000 UT) CAPS Take 1 capsule by mouth daily.     clopidogrel (PLAVIX) 75 MG tablet Take 1 tablet by mouth once daily 90 tablet 3   lisinopril (ZESTRIL) 10 MG tablet Take 1 tablet by mouth once daily 90 tablet 0   meclizine (ANTIVERT) 25 MG tablet Take 1 tablet (25 mg total) by mouth 3 (three) times daily as needed for dizziness. 30 tablet 2   metoprolol succinate (TOPROL-XL) 50 MG 24 hr tablet TAKE 1 TABLET BY MOUTH ONCE DAILY WITH MEALS OR  IMMEDIATELY  FOLLOWING  A  MEAL 90 tablet 0   pantoprazole (PROTONIX) 40 MG tablet TAKE 1 TABLET BY MOUTH ONCE DAILY . APPOINTMENT REQUIRED FOR FUTURE REFILLS 90 tablet 0   traZODone (DESYREL) 50 MG tablet TAKE 1/2 TO 1 (ONE-HALF TO ONE)  TABLET BY MOUTH AT BEDTIME AS NEEDED 90 tablet 1   vitamin B-12 (CYANOCOBALAMIN) 1000 MCG tablet Take 1,000 mcg by mouth daily.     No current facility-administered medications for this visit.     Past Medical History:  Diagnosis Date   Aorto-iliac atherosclerosis ([place])    last aaa duplex 08-30-2015  >50% stenosis bilateral common iliac arteries and left external iliac artery   Arrhythmia    Carotid stenosis, bilateral    cardiologist-- dr Stanford Breed ([place] 08-06-2015)  04-29-2017 followed by pcp/  last duplex 03-08-2017 bilateral ICA 40-59% post endarterectomy,  right ECA occluded   Cerebral vascular disease    Chronic gastritis    Elevated cholesterol    Esophageal stricture    Full dentures    GERD (gastroesophageal reflux disease)    Hiatal hernia    History of aortic valve disorder    per cardiology note dated 08-06-2015 by dr Stanford Breed pt has hx small fibroblastoma aortic valve per echo 2008 /  last echo 08-23-2015 no fibroblastoma noted or stenosis or thickened/ calcification   History of gastric ulcer 2013   History of nuclear stress test 06-17-2006   per dr Stanford Breed note dated 08-06-2015 , normal study   History of transient ischemic attack (TIA) 04/25/2006   severe left ICA stenosis/   04-29-2017 per pt no residual   HLD (hyperlipidemia)    HTN (hypertension)    IBS (irritable bowel syndrome)    OA (osteoarthritis)  Pneumonia    Prolapsed internal hemorrhoids, grade 3    PVD (peripheral vascular disease) (HCC)    Rectal bleed    SYMPTOM, ECCHYMOSES, SPONTANEOUS 06/16/2006   Qualifier: Diagnosis of  By: Leanne Chang MD, Bruce     Vertigo    Wears contact lenses     Past Surgical History:  Procedure Laterality Date   CAROTID ENDARTERECTOMY Bilateral left 04-29-2006 and right 08-17-2007 by  dr c. Scot Dock  [facility]   HEMORRHOID SURGERY N/A 05/07/2017   Procedure: ANAL EXAM UNDER ANESTHESIA, HEMORRHOIDECTOMY;  Surgeon: Leighton Ruff, MD;  Location: [facility];   Service: General;  Laterality: N/A;   NASAL SINUS SURGERY  age 24   TONSILLECTOMY  age 67   TRANSTHORACIC ECHOCARDIOGRAM  08/23/2015   ef 15-94%, grade 1 diastolic dysfunction/ mild AR and MR   VASCULAR SURGERY Bilateral    carotids   WRIST GANGLION EXCISION Right yrs ago    Social History   Socioeconomic History   Marital status: Widowed    Spouse name: Not on file   Number of children: 0   Years of education: 14   Highest education level: Associate degree: occupational, Hotel manager, or vocational program  Occupational History   Occupation: [employer]    Comment: retired  Tobacco Use   Smoking status: Former    Packs/day: 0.50    Years: 30.00    Pack years: 15.00    Types: Cigarettes    Quit date: 04/02/2012    Years since quitting: 8.6   Smokeless tobacco: Never  Vaping Use   Vaping Use: Former  Substance and Sexual Activity   Alcohol use: No   Drug use: No   Sexual activity: Not on file  Other Topics Concern   Not on file  Social History Narrative    widow   Worked for [employer].       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[employer], substitute for [place] important part of her life   Has three living siblings, local, good support Psychologist, occupational at nursing facilities with hospice   Social Determinants of Health   Financial Resource Strain: Low Risk    Difficulty of Paying Living Expenses: Not hard at all  Food Insecurity: No Food Insecurity   Worried About Charity fundraiser in the Last Year: Never true   Arboriculturist in the Last Year: Never true  Transportation Needs: No Transportation Needs   Lack of Transportation (Medical): No   Lack of Transportation (Non-Medical): No  Physical Activity: Insufficiently Active   Days of Exercise per Week: 3 days   Minutes of Exercise per Session: 30 min  Stress: No Stress Concern Present   Feeling of Stress  : Not at all  Social Connections: Moderately Isolated   Frequency of Communication with Friends and Family: More than three times a week   Frequency of Social Gatherings with Friends and Family: More than three times a week   Attends Religious Services: More than 4 times per year   Active Member of [employer] or Organizations: No   Attends Archivist Meetings: Never   Marital Status: Widowed  Human resources officer Violence: Not At Risk   Fear of Current or Ex-Partner: No   Emotionally Abused: No   Physically Abused: No   Sexually Abused: No    Family History  Problem Relation Age of Onset   Stroke  Father    Diabetes Mother    Asthma Mother    Heart disease Brother    Diabetes Other    Breast cancer Other    Rheum arthritis Sister    Colon cancer Neg Hx     ROS: Diffuse myalgias and arthralgias but no fevers or chills, productive cough, hemoptysis, dysphasia, odynophagia, melena, hematochezia, dysuria, hematuria, rash, seizure activity, orthopnea, PND, pedal edema, claudication. Remaining systems are negative.  Physical Exam: Well-developed well-nourished in no acute distress.  Skin is warm and dry.  HEENT is normal.  Neck is supple.  Bilateral bruits Chest is clear to auscultation with normal expansion.  Cardiovascular exam is regular rate and rhythm.  Abdominal exam nontender or distended. No masses palpated. Extremities show no edema. neuro grossly intact  ECG-normal sinus rhythm at a rate of 74, normal axis, cannot rule out septal infarct.  Personally reviewed  A/P  1 carotid artery disease-continue medical therapy with aspirin and statin.  Plan follow-up carotid Dopplers February 2023.  2 hypertension-patient's blood pressure is controlled.  Continue present medications and follow.  3 hyperlipidemia-patient recently completed participation in [place] [facility].  We will check lipids in 8 weeks.  We will then decide on best management strategy.  Note she has not tolerated  statins previously.  4 history of aortic valve fibroelastoma  5 peripheral vascular disease-no claudication noted based on history.  Continue medical therapy with aspirin and statin.  Kirk Ruths, MD

## 2020-11-25 ENCOUNTER — Other Ambulatory Visit: Payer: Self-pay

## 2020-11-25 ENCOUNTER — Ambulatory Visit: Payer: Medicare PPO | Admitting: Cardiology

## 2020-11-25 ENCOUNTER — Encounter: Payer: Self-pay | Admitting: Cardiology

## 2020-11-25 VITALS — BP 138/84 | HR 74 | Ht 61.0 in | Wt 147.0 lb

## 2020-11-25 DIAGNOSIS — I739 Peripheral vascular disease, unspecified: Secondary | ICD-10-CM

## 2020-11-25 DIAGNOSIS — E7800 Pure hypercholesterolemia, unspecified: Secondary | ICD-10-CM

## 2020-11-25 DIAGNOSIS — I10 Essential (primary) hypertension: Secondary | ICD-10-CM

## 2020-11-25 DIAGNOSIS — I679 Cerebrovascular disease, unspecified: Secondary | ICD-10-CM

## 2020-11-25 NOTE — Patient Instructions (Signed)
  Lab Work:  Your physician recommends that you return for lab work in:[facility]  If you have labs (blood work) drawn today and your tests are completely normal, you will receive your results only by: [employer] (if you have [place]) OR A paper copy in the mail If you have any lab test that is abnormal or we need to change your treatment, we will call you to review the results.   Follow-Up: At [facility], you and your health needs are our priority.  As part of our continuing mission to provide you with exceptional heart care, we have created designated Provider Care Teams.  These Care Teams include your primary Cardiologist (physician) and Advanced Practice Providers (APPs -  Physician Assistants and Nurse Practitioners) who all work together to provide you with the care you need, when you need it.  We recommend signing up for the patient portal called "MyChart".  Sign up information is provided on this After Visit Summary.  MyChart is used to connect with patients for Virtual Visits (Telemedicine).  Patients are able to view lab/test results, encounter notes, upcoming appointments, etc.  Non-urgent messages can be sent to your provider as well.   To learn more about what you can do with MyChart, go to [email or web address].    Your next appointment:   12 month(s)  The format for your next appointment:   In Person  Provider:   Kirk Ruths, MD

## 2020-12-04 ENCOUNTER — Other Ambulatory Visit: Payer: Self-pay | Admitting: Family Medicine

## 2020-12-06 ENCOUNTER — Other Ambulatory Visit: Payer: Self-pay | Admitting: Family Medicine

## 2020-12-06 DIAGNOSIS — K219 Gastro-esophageal reflux disease without esophagitis: Secondary | ICD-10-CM

## 2020-12-07 ENCOUNTER — Other Ambulatory Visit: Payer: Self-pay | Admitting: Family Medicine

## 2020-12-07 DIAGNOSIS — K219 Gastro-esophageal reflux disease without esophagitis: Secondary | ICD-10-CM

## 2020-12-07 DIAGNOSIS — I10 Essential (primary) hypertension: Secondary | ICD-10-CM

## 2020-12-07 NOTE — Telephone Encounter (Signed)
She is overdue for office visit.

## 2020-12-10 NOTE — Telephone Encounter (Signed)
Patient informed of the message below.  Appt scheduled for 12/21.

## 2020-12-12 NOTE — Addendum Note (Signed)
Addended by: Philemon Kingdom D on: 12/12/2020 04:14 PM   Modules accepted: Orders

## 2020-12-12 NOTE — Research (Signed)
Updated med list to d/c ambulatory non formulary medication, for research. Her last dose was 06/25/2020

## 2020-12-27 DIAGNOSIS — M25512 Pain in left shoulder: Secondary | ICD-10-CM | POA: Diagnosis not present

## 2021-01-15 ENCOUNTER — Ambulatory Visit: Payer: Medicare PPO | Admitting: Family Medicine

## 2021-01-15 ENCOUNTER — Encounter: Payer: Self-pay | Admitting: Family Medicine

## 2021-01-15 VITALS — BP 120/70 | HR 89 | Temp 98.0°F | Ht 61.0 in | Wt 144.1 lb

## 2021-01-15 DIAGNOSIS — I10 Essential (primary) hypertension: Secondary | ICD-10-CM

## 2021-01-15 DIAGNOSIS — K219 Gastro-esophageal reflux disease without esophagitis: Secondary | ICD-10-CM

## 2021-01-15 DIAGNOSIS — Z23 Encounter for immunization: Secondary | ICD-10-CM | POA: Diagnosis not present

## 2021-01-15 DIAGNOSIS — E7800 Pure hypercholesterolemia, unspecified: Secondary | ICD-10-CM

## 2021-01-15 DIAGNOSIS — R739 Hyperglycemia, unspecified: Secondary | ICD-10-CM

## 2021-01-15 DIAGNOSIS — M25512 Pain in left shoulder: Secondary | ICD-10-CM

## 2021-01-15 NOTE — Patient Instructions (Addendum)
Check home blood pressures and our goal is 110-135/65-85 regularly. If we see you regularly above this number, I would consider adjusting your medication. Update me in a couple of weeks with numbers.

## 2021-01-15 NOTE — Progress Notes (Signed)
[name] [name] DOB: 1945/02/14 Encounter date: 01/15/2021  This is a 75 y.o. female who presents with Chief Complaint  Patient presents with   Follow-up    History of present illness: from study director - 10/2020: [name] [name] just completed her time in the ORION 8 study.  She participated in [place] 10 (Randomized to [place] or [place]).  After day 510, all patients received active treatment for 3 years.  The trial is ended.  Her last dose was 6 months ago, and while it lasts a bit, she needs an office visit to determine next steps  (any other treatment).  Inclisiran is commercially available.   Per last cardiology note (Dr. Stanford Breed) - statin intolerant. Planned to recheck lipids this month and determine treatment plan pending results.   Last visit with me was 03/2020.  She was having a lot of abdominal discomfort at that time.  Prior to that she was seen in January.  HTN: lisinopril 10mg  daily, metoprpolol 50mg  daily   Hx of TIA s/p bilat carotid endarterectomy - plavix, asa   Impaired fasting glucose:she does watch what she eats; watches carbs. GERD:protonix40mg . Controlled with this.  No concerns except a couple of things that she would like advice on.   In past month has had left shoulder pain that is different than any other pain she has had. Fell directly on right shoulder;both feet slipped right out from under her. But didn't hurt left shoulder with this. Saw doc last week for left shoulder. Had xray and that was ok. Told tendonitis. Had cortisone shot on 12/2. Hard to get dressed. Abduction limited to less than 90 degrees. Hard to do everything- washing hair. Hurts all the time. It is better than it was. Has always had some stiff neck/trap bilat tight. No weakness,numbness, tingling in left hand.     Allergies  Allergen Reactions   Sulfa Antibiotics Swelling   Current Meds  Medication Sig   aspirin EC 81 MG tablet Take 1 tablet (81 mg total) by mouth daily.   buPROPion  (WELLBUTRIN XL) 300 MG 24 hr tablet TAKE 1 TABLET BY MOUTH ONCE DAILY. PATIENT NEEDS TO SCHEDULE PHYSICAL EXAM   Cholecalciferol (VITAMIN D3) 25 MCG (1000 UT) CAPS Take 1 capsule by mouth daily.   clopidogrel (PLAVIX) 75 MG tablet Take 1 tablet by mouth once daily   lisinopril (ZESTRIL) 10 MG tablet TAKE 1 TABLET BY MOUTH ONCE DAILY. PT NEEDS TO SCHEDULE PHYSICAL EXAM   meclizine (ANTIVERT) 25 MG tablet Take 1 tablet (25 mg total) by mouth 3 (three) times daily as needed for dizziness.   metoprolol succinate (TOPROL-XL) 50 MG 24 hr tablet TAKE 1 TABLET BY MOUTH ONCE DAILY WITH MEALS OR  IMMEDIATELY  FOLLOWING  A  MEAL   pantoprazole (PROTONIX) 40 MG tablet TAKE 1 TABLET BY MOUTH ONCE DAILY. APPOINTMENT REQUIRED FOR FUTURE REFILLS.   traZODone (DESYREL) 50 MG tablet TAKE 1/2 TO 1 (ONE-HALF TO ONE) TABLET BY MOUTH AT BEDTIME AS NEEDED   vitamin B-12 (CYANOCOBALAMIN) 1000 MCG tablet Take 1,000 mcg by mouth daily.    Review of Systems  Constitutional:  Negative for chills, fatigue and fever.  Respiratory:  Negative for cough, chest tightness, shortness of breath and wheezing.   Cardiovascular:  Negative for chest pain, palpitations and leg swelling.  Musculoskeletal:  Positive for arthralgias.   Objective:  BP 120/70 Comment: repeated by Rachel--jaf   Pulse 89    Temp 98 F (36.7 C) (Oral)    Ht  5\' 1"  (1.549 m)    Wt 144 lb 1.6 oz (65.4 kg)    SpO2 99%    BMI 27.23 kg/m   Weight: 144 lb 1.6 oz (65.4 kg)   BP Readings from Last 3 Encounters:  01/15/21 120/70  11/25/20 138/84  06/25/20 (!) 154/70   Wt Readings from Last 3 Encounters:  01/15/21 144 lb 1.6 oz (65.4 kg)  11/25/20 147 lb (66.7 kg)  06/25/20 147 lb (66.7 kg)    Physical Exam Constitutional:      General: She is not in acute distress.    Appearance: She is well-developed.  Cardiovascular:     Rate and Rhythm: Normal rate and regular rhythm.     Heart sounds: Normal heart sounds. No murmur heard.   No friction rub.   Pulmonary:     Effort: Pulmonary effort is normal. No respiratory distress.     Breath sounds: Normal breath sounds. No wheezing or rales.  Musculoskeletal:     Right lower leg: No edema.     Left lower leg: No edema.     Comments: She cannot abduct her left shoulder more than 90 degrees. Pain with posterior movement. She has tenderness left trap, para scapular.  Neurological:     Mental Status: She is alert and oriented to person, place, and time.  Psychiatric:        Behavior: Behavior normal.    Assessment/Plan  1. Essential hypertension Continue with lisinopril 10mg , toprol 50mg . Well controlled.  - CBC with Differential/Platelet; Future - Comprehensive metabolic panel; Future  2. Gastroesophageal reflux disease, unspecified whether esophagitis present Continue with Protonix 40 mg.  She had normal upper endoscopy in 2020 and is followed up with gastroenterology.  No longer having abdominal discomfort.  We will consider tapering as tolerated.  3. HYPERCHOLESTEROLEMIA See HPI.  She has just completed her study.  We will determine cholesterol treatment plan pending lipid panel. - Lipid panel; Future  4. Need for immunization against influenza - Flu Vaccine QUAD High Dose(Fluad)  5. Left shoulder pain, unspecified chronicity Encouraged her to follow back up with Ortho.  She has had very minimal improvement with cortisone injection.  I did give her some gentle range of motion exercises to do today until she is able to see them again.  6. Hyperglycemia - Hemoglobin A1c; Future     Return in about 6 months (around 07/16/2021) for physical exam.    Micheline Rough, MD

## 2021-02-01 ENCOUNTER — Other Ambulatory Visit: Payer: Self-pay | Admitting: Family Medicine

## 2021-02-13 ENCOUNTER — Telehealth: Payer: Self-pay | Admitting: Family Medicine

## 2021-02-13 NOTE — Telephone Encounter (Signed)
Left message for patient to call back and schedule Medicare Annual Wellness Visit (AWV) either virtually or in office. Left  my Herbie Drape number [phone]   Last AWV 03/06/20 ; please schedule at anytime with [facility] Nurse Health Advisor 1 or 2  Awv can be scheduled calendar year Mcarthur Rossetti  This should be a 45 minute visit.

## 2021-03-05 ENCOUNTER — Other Ambulatory Visit: Payer: Self-pay | Admitting: Family Medicine

## 2021-03-05 DIAGNOSIS — I10 Essential (primary) hypertension: Secondary | ICD-10-CM

## 2021-03-07 ENCOUNTER — Other Ambulatory Visit: Payer: Self-pay | Admitting: *Deleted

## 2021-03-07 DIAGNOSIS — K219 Gastro-esophageal reflux disease without esophagitis: Secondary | ICD-10-CM

## 2021-03-07 MED ORDER — PANTOPRAZOLE SODIUM 40 MG PO TBEC: DELAYED_RELEASE_TABLET | ORAL | 1 refills | Status: AC

## 2021-03-08 MED ORDER — CLOPIDOGREL BISULFATE 75 MG PO TABS
75.0000 mg | ORAL_TABLET | Freq: Every day | ORAL | 3 refills | Status: DC
Start: 2021-03-08 — End: 2021-05-02

## 2021-03-18 ENCOUNTER — Telehealth: Payer: Self-pay | Admitting: Family Medicine

## 2021-03-18 NOTE — Telephone Encounter (Signed)
Left message for patient to call back and schedule Medicare Annual Wellness Visit (AWV) either virtually or in office. Left  my Herbie Drape number [phone]   Last AWV 03/06/20  please schedule at anytime with [facility] Nurse Health Advisor 1 or 2   This should be a 45 minute visit.

## 2021-03-21 ENCOUNTER — Other Ambulatory Visit: Payer: Self-pay

## 2021-03-21 ENCOUNTER — Other Ambulatory Visit (HOSPITAL_COMMUNITY): Payer: Self-pay | Admitting: Cardiology

## 2021-03-21 ENCOUNTER — Ambulatory Visit (HOSPITAL_COMMUNITY)
Admission: RE | Admit: 2021-03-21 | Discharge: 2021-03-21 | Disposition: A | Discharge status: Home / Self Care | Payer: Medicare PPO | Source: Ambulatory Visit | Attending: Cardiology | Admitting: Cardiology

## 2021-03-21 DIAGNOSIS — I6523 Occlusion and stenosis of bilateral carotid arteries: Secondary | ICD-10-CM | POA: Diagnosis not present

## 2021-03-21 DIAGNOSIS — Z9889 Other specified postprocedural states: Secondary | ICD-10-CM

## 2021-03-21 NOTE — Telephone Encounter (Signed)
Returned pt call    Pt returned my call 03/20/21

## 2021-03-26 ENCOUNTER — Encounter: Payer: Self-pay | Admitting: *Deleted

## 2021-03-31 ENCOUNTER — Ambulatory Visit (INDEPENDENT_AMBULATORY_CARE_PROVIDER_SITE_OTHER): Payer: Medicare PPO

## 2021-03-31 VITALS — Ht 61.0 in | Wt 145.0 lb

## 2021-03-31 DIAGNOSIS — Z0000 Encounter for general adult medical examination without abnormal findings: Secondary | ICD-10-CM

## 2021-03-31 NOTE — Progress Notes (Signed)
I connected with [name] [name] today by telephone and verified that I am speaking with the correct person using two identifiers. Location patient: home Location provider: work Persons participating in the virtual visit: Sherilyn Windhorst, Glenna Durand LPN.   I discussed the limitations, risks, security and privacy concerns of performing an evaluation and management service by telephone and the availability of in person appointments. I also discussed with the patient that there may be a patient responsible charge related to this service. The patient expressed understanding and verbally consented to this telephonic visit.    Interactive audio and video telecommunications were attempted between this provider and patient, however failed, due to patient having technical difficulties OR patient did not have access to video capability.  We continued and completed visit with audio only.     Vital signs may be patient reported or missing.  Subjective:   [name] [name] is a 76 y.o. female who presents for Medicare Annual (Subsequent) preventive examination.  Review of Systems     Cardiac Risk Factors include: advanced age (>68mn, >>73women);dyslipidemia;hypertension     Objective:    Today's Vitals   03/31/21 1511 03/31/21 1512  Weight: 145 lb (65.8 kg)   Height: '5\' 1"'$  (1.549 m)   PainSc:  9    Body mass index is 27.4 kg/m.  Advanced Directives 03/31/2021 03/06/2020 03/25/2019 03/06/2019 05/07/2017  Does Patient Have a Medical Advance Directive? Yes Yes Yes Yes Yes  Type of AParamedicof AFlower HillLiving will HLindyLiving will HWilming[place]Living will HColonyLiving will HUnion DaleLiving will  Does patient want to make changes to medical advance directive? - No - Patient declined - No - Patient declined No - Patient declined  Copy of [place] Chart? No - copy requested No - copy  requested No - copy requested No - copy requested No - copy requested    Current Medications (verified) Outpatient Encounter Medications as of 03/31/2021  Medication Sig   aspirin EC 81 MG tablet Take 1 tablet (81 mg total) by mouth daily.   buPROPion (WELLBUTRIN XL) 300 MG 24 hr tablet TAKE 1 TABLET BY MOUTH ONCE DAILY . PATIENT NEEDS TO SCHEDULE PHYSICAL EXAM   Cholecalciferol (VITAMIN D3) 25 MCG (1000 UT) CAPS Take 1 capsule by mouth daily.   clopidogrel (PLAVIX) 75 MG tablet Take 1 tablet (75 mg total) by mouth daily.   lisinopril (ZESTRIL) 10 MG tablet TAKE 1 TABLET BY MOUTH ONCE DAILY. PATIENT NEEDS TO SCHEDULE PHYSICAL EXAM   meclizine (ANTIVERT) 25 MG tablet Take 1 tablet (25 mg total) by mouth 3 (three) times daily as needed for dizziness.   metoprolol succinate (TOPROL-XL) 50 MG 24 hr tablet TAKE 1 TABLET BY MOUTH ONCE DAILY WITH  MEALS  OR  IMMEDIATELY  FOLLOWING  A  MEAL.   pantoprazole (PROTONIX) 40 MG tablet TAKE 1 TABLET BY MOUTH ONCE DAILY. APPOINTMENT REQUIRED FOR FUTURE REFILLS.   traZODone (DESYREL) 50 MG tablet TAKE 1/2 TO 1 (ONE-HALF TO ONE) TABLET BY MOUTH AT BEDTIME AS NEEDED   vitamin B-12 (CYANOCOBALAMIN) 1000 MCG tablet Take 1,000 mcg by mouth daily.   No facility-administered encounter medications on file as of 03/31/2021.    Allergies (verified) Sulfa antibiotics   History: Past Medical History:  Diagnosis Date   Aorto-iliac atherosclerosis ([place]    last aaa duplex 08-30-2015  >50% stenosis bilateral common iliac arteries and left external iliac artery  Arrhythmia    Carotid stenosis, bilateral    cardiologist-- dr Stanford Breed ([place] 08-06-2015)  04-29-2017 followed by pcp/  last duplex 03-08-2017 bilateral ICA 40-59% post endarterectomy,  right ECA occluded   Cerebral vascular disease    Chronic gastritis    Elevated cholesterol    Esophageal stricture    Full dentures    GERD (gastroesophageal reflux disease)    Hiatal hernia    History of aortic valve  disorder    per cardiology note dated 08-06-2015 by dr Stanford Breed pt has hx small fibroblastoma aortic valve per echo 2008 /  last echo 08-23-2015 no fibroblastoma noted or stenosis or thickened/ calcification   History of gastric ulcer 2013   History of nuclear stress test 06-17-2006   per dr Stanford Breed note dated 08-06-2015 , normal study   History of transient ischemic attack (TIA) 04/25/2006   severe left ICA stenosis/   04-29-2017 per pt no residual   HLD (hyperlipidemia)    HTN (hypertension)    IBS (irritable bowel syndrome)    OA (osteoarthritis)    Pneumonia    Prolapsed internal hemorrhoids, grade 3    PVD (peripheral vascular disease) ([place])    Rectal bleed    SYMPTOM, ECCHYMOSES, SPONTANEOUS 06/16/2006   Qualifier: Diagnosis of  By: Leanne Chang MD, Bruce     Vertigo    Wears contact lenses    Past Surgical History:  Procedure Laterality Date   CAROTID ENDARTERECTOMY Bilateral left 04-29-2006 and right 08-17-2007 by  dr c. Scot Dock  [facility]   HEMORRHOID SURGERY N/A 05/07/2017   Procedure: ANAL EXAM UNDER ANESTHESIA, HEMORRHOIDECTOMY;  Surgeon: Leighton Ruff, MD;  Location: [place];  Service: General;  Laterality: N/A;   NASAL SINUS SURGERY  age 84   TONSILLECTOMY  age 31   TRANSTHORACIC ECHOCARDIOGRAM  08/23/2015   ef 16-10%, grade 1 diastolic dysfunction/ mild AR and MR   VASCULAR SURGERY Bilateral    carotids   WRIST GANGLION EXCISION Right yrs ago   Family History  Problem Relation Age of Onset   Stroke Father    Diabetes Mother    Asthma Mother    Heart disease Brother    Diabetes Other    Breast cancer Other    Rheum arthritis Sister    Colon cancer Neg Hx    Social History   Socioeconomic History   Marital status: Widowed    Spouse name: Not on file   Number of children: 0   Years of education: 14   Highest education level: Associate degree: occupational, Hotel manager, or vocational program  Occupational History   Occupation: [employer]    Comment: retired  Tobacco Use   Smoking status: Former    Packs/day: 0.50    Years: 30.00    Pack years: 15.00    Types: Cigarettes    Quit date: 04/02/2012    Years since quitting: 9.0   Smokeless tobacco: Never  Vaping Use   Vaping Use: Former  Substance and Sexual Activity   Alcohol use: No   Drug use: No   Sexual activity: Not on file  Other Topics Concern   Not on file  Social History Narrative    widow   Worked for [employer].       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[employer], substitute for [place] important part of her life   Has three living siblings, local, good support  Psychologist, occupational at nursing facilities with hospice   Social Determinants of Health   Financial Resource Strain: Low Risk    Difficulty of Paying Living Expenses: Not hard at all  Food Insecurity: No Food Insecurity   Worried About Charity fundraiser in the Last Year: Never true   Arboriculturist in the Last Year: Never true  Transportation Needs: No Transportation Needs   Lack of Transportation (Medical): No   Lack of Transportation (Non-Medical): No  Physical Activity: Insufficiently Active   Days of Exercise per Week: 3 days   Minutes of Exercise per Session: 30 min  Stress: No Stress Concern Present   Feeling of Stress : Not at all  Social Connections: Not on file    Tobacco Counseling Counseling given: Not Answered   Clinical Intake:  Pre-visit preparation completed: Yes  Pain : 0-10 Pain Score: 9  Pain Type: Chronic pain Pain Location: Neck Pain Descriptors / Indicators: Tightness Pain Onset: More than a month ago Pain Frequency: Constant     Nutritional Status: BMI 25 -29 Overweight Nutritional Risks: None Diabetes: No  How often do you need to have someone help you when you read instructions, pamphlets, or other written materials from your doctor or pharmacy?:  1 - Never What is the last grade level you completed in school?: 42yr college  Diabetic? no  Interpreter Needed?: No  Information entered by :: NAllen LPN   Activities of Daily Living In your present state of health, do you have any difficulty performing the following activities: 03/31/2021  Hearing? N  Comment slight decrease in left ear  Vision? N  Difficulty concentrating or making decisions? N  Walking or climbing stairs? N  Dressing or bathing? N  Doing errands, shopping? N  Preparing Food and eating ? N  Using the Toilet? N  In the past six months, have you accidently leaked urine? N  Do you have problems with loss of bowel control? N  Managing your Medications? N  Managing your Finances? N  Housekeeping or managing your Housekeeping? N  Some recent data might be hidden    Patient Care Team: KCaren Macadam MD as PCP - General (Family Medicine) CStanford BreedBDenice Bors MD as Consulting Physician (Cardiology) MBelva Bertin(Ophthalmology)  Indicate any recent Medical Services you may have received from other than [facility] providers in the past year (date may be approximate).     Assessment:   This is a routine wellness examination for FVeora  Hearing/Vision screen Vision Screening - Comments:: No regular eye exams, VMcClureissues and exercise activities discussed: Current Exercise Habits: Home exercise routine, Type of exercise: walking, Time (Minutes): 30, Frequency (Times/Week): 3, Weekly Exercise (Minutes/Week): 90   Goals Addressed             This Visit's Progress    Patient Stated       03/31/2021, wants to get neck pain under control       Depression Screen PHQ 2/9 Scores 03/31/2021 01/15/2021 03/06/2020 03/06/2019 03/02/2018 03/02/2018  PHQ - 2 Score 0 0 0 0 0 0  PHQ- 9 Score - 0 - - 0 5    Fall Risk Fall Risk  03/31/2021 03/06/2020 03/06/2019 03/02/2018  Falls in the past year? 1 1 0 0  Comment slipped on the wet floor - - -  Number falls in past  yr: 0 0 - -  Injury with Fall? 0 0 - -  Risk  for fall due to : Medication side effect No Fall Risks - -  Follow up Falls evaluation completed;Education provided;Falls prevention discussed Falls evaluation completed;Falls prevention discussed Falls evaluation completed;Education provided;Falls prevention discussed -    FALL RISK PREVENTION PERTAINING TO THE HOME:  Any stairs in or around the home? Yes  If so, are there any without handrails? No  Home free of loose throw rugs in walkways, pet beds, electrical cords, etc? Yes  Adequate lighting in your home to reduce risk of falls? Yes   ASSISTIVE DEVICES UTILIZED TO PREVENT FALLS:  Life alert? No  Use of a cane, walker or w/c? No  Grab bars in the bathroom? No  Shower chair or bench in shower? No  Elevated toilet seat or a handicapped toilet? No   TIMED UP AND GO:  Was the test performed? No .       Cognitive Function:     6CIT Screen 03/31/2021 03/06/2019  What Year? 0 points 0 points  What month? 0 points 0 points  What time? 0 points 0 points  Count back from 20 0 points 0 points  Months in reverse 0 points 0 points  Repeat phrase 2 points 0 points  Total Score 2 0    Immunizations Immunization History  Administered Date(s) Administered   Fluad Quad(high Dose 65+) 01/15/2021   Influenza-Unspecified 01/26/2018, 12/27/2019   PFIZER(Purple Top)SARS-COV-2 Vaccination 01/04/2020, 01/25/2020   Pneumococcal Conjugate-13 12/11/2013, 03/02/2018   Pneumococcal Polysaccharide-23 01/24/2015   Tdap 01/26/2009   Zoster Recombinat (Shingrix) 03/02/2018, 09/02/2018    TDAP status: Due, Education has been provided regarding the importance of this vaccine. Advised may receive this vaccine at local pharmacy or Health Dept. Aware to provide a copy of the vaccination record if obtained from local pharmacy or Health Dept. Verbalized acceptance and understanding.  Flu Vaccine status: Up to date  Pneumococcal vaccine status: Up to  date  Covid-19 vaccine status: Completed vaccines  Qualifies for Shingles Vaccine? Yes   Zostavax completed No   Shingrix Completed?: Yes  Screening Tests Health Maintenance  Topic Date Due   TETANUS/TDAP  04/15/2021 (Originally 01/27/2019)   COVID-19 Vaccine (3 - Booster for Pfizer series) 04/16/2021 (Originally 03/21/2020)   Pneumonia Vaccine 27+ Years old  Completed   INFLUENZA VACCINE  Completed   DEXA SCAN  Completed   Hepatitis C Screening  Completed   Zoster Vaccines- Shingrix  Completed   HPV VACCINES  Aged Out   MAMMOGRAM  Discontinued   COLONOSCOPY (Pts 45-50yr Insurance coverage will need to be confirmed)  Discontinued    Health Maintenance  There are no preventive care reminders to display for this patient.   Colorectal cancer screening: No longer required.   Mammogram status: No longer required due to age.  Bone Density status: Completed 03/20/2019.   Lung Cancer Screening: (Low Dose CT Chest recommended if Age 77-80years, 30 pack-year currently smoking OR have quit w/in 15years.) does not qualify.   Lung Cancer Screening Referral: no  Additional Screening:  Hepatitis C Screening: does qualify; Completed 04/12/2020  Vision Screening: Recommended annual ophthalmology exams for early detection of glaucoma and other disorders of the eye. Is the patient up to date with their annual eye exam?  No  Who is the provider or what is the name of the office in which the patient attends annual eye exams? [place] is not established with a provider, would they like to be referred to a provider to establish care? No .  Dental Screening: Recommended annual dental exams for proper oral hygiene  Community Resource Referral / Chronic Care Management: CRR required this visit?  No   CCM required this visit?  No      Plan:     I have personally reviewed and noted the following in the patients chart:   Medical and social history Use of alcohol, tobacco or  illicit drugs  Current medications and supplements including opioid prescriptions.  Functional ability and status Nutritional status Physical activity Advanced directives List of other physicians Hospitalizations, surgeries, and ER visits in previous 12 months Vitals Screenings to include cognitive, depression, and falls Referrals and appointments  In addition, I have reviewed and discussed with patient certain preventive protocols, quality metrics, and best practice recommendations. A written personalized care plan for preventive services as well as general preventive health recommendations were provided to patient.     Kellie Simmering, LPN   06/28/8464   Nurse Notes: Patient complains of a neck pain that she can't get quite under control. Encouraged patient to make appointment wit PCP to get it checked out. Patient states that she will give it a little more time and will reach out to the PCP if it does not get any better.  Due to this being a virtual visit, the after visit summary with patients personalized plan was offered to patient via mail or my-chart.   patient was mailed a copy of AVS.

## 2021-03-31 NOTE — Patient Instructions (Signed)
[name] [name] , Thank you for taking time to come for your Medicare Wellness Visit. I appreciate your ongoing commitment to your health goals. Please review the following plan we discussed and let me know if I can assist you in the future.   Screening recommendations/referrals: Colonoscopy: not required Mammogram: not required Bone Density: completed 03/20/2019 Recommended yearly ophthalmology/optometry visit for glaucoma screening and checkup Recommended yearly dental visit for hygiene and checkup  Vaccinations: Influenza vaccine: completed 01/15/2021, due next flu season Pneumococcal vaccine: completed 03/02/2018 Tdap vaccine: due Shingles vaccine: completed   Covid-19: 01/04/2020, 01/25/2020  Advanced directives: Please bring a copy of your POA (Power of Attorney) and/or Living Will to your next appointment.   Conditions/risks identified: none  Next appointment: Follow up in one year for your annual wellness visit    Preventive Care 65 Years and Older, Female Preventive care refers to lifestyle choices and visits with your health care provider that can promote health and wellness. What does preventive care include? A yearly physical exam. This is also called an annual well check. Dental exams once or twice a year. Routine eye exams. Ask your health care provider how often you should have your eyes checked. Personal lifestyle choices, including: Daily care of your teeth and gums. Regular physical activity. Eating a healthy diet. Avoiding tobacco and drug use. Limiting alcohol use. Practicing safe sex. Taking low-dose aspirin every day. Taking vitamin and mineral supplements as recommended by your health care provider. What happens during an annual well check? The services and screenings done by your health care provider during your annual well check will depend on your age, overall health, lifestyle risk factors, and family history of disease. Counseling  Your health care  provider may ask you questions about your: Alcohol use. Tobacco use. Drug use. Emotional well-being. Home and relationship well-being. Sexual activity. Eating habits. History of falls. Memory and ability to understand (cognition). Work and work Statistician. Reproductive health. Screening  You may have the following tests or measurements: Height, weight, and BMI. Blood pressure. Lipid and cholesterol levels. These may be checked every 5 years, or more frequently if you are over 82 years old. Skin check. Lung cancer screening. You may have this screening every year starting at age 82 if you have a 30-pack-year history of smoking and currently smoke or have quit within the past 15 years. Fecal occult blood test (FOBT) of the stool. You may have this test every year starting at age 45. Flexible sigmoidoscopy or colonoscopy. You may have a sigmoidoscopy every 5 years or a colonoscopy every 10 years starting at age 12. Hepatitis C blood test. Hepatitis B blood test. Sexually transmitted disease (STD) testing. Diabetes screening. This is done by checking your blood sugar (glucose) after you have not eaten for a while (fasting). You may have this done every 1-3 years. Bone density scan. This is done to screen for osteoporosis. You may have this done starting at age 50. Mammogram. This may be done every 1-2 years. Talk to your health care provider about how often you should have regular mammograms. Talk with your health care provider about your test results, treatment options, and if necessary, the need for more tests. Vaccines  Your health care provider may recommend certain vaccines, such as: Influenza vaccine. This is recommended every year. Tetanus, diphtheria, and acellular pertussis (Tdap, Td) vaccine. You may need a Td booster every 10 years. Zoster vaccine. You may need this after age 51. Pneumococcal 13-valent conjugate (PCV13) vaccine. One dose  is recommended after age  47. Pneumococcal polysaccharide (PPSV23) vaccine. One dose is recommended after age 104. Talk to your health care provider about which screenings and vaccines you need and how often you need them. This information is not intended to replace advice given to you by your health care provider. Make sure you discuss any questions you have with your health care provider. Document Released: 02/08/2015 Document Revised: 10/02/2015 Document Reviewed: 11/13/2014 Elsevier Interactive Patient Education  2017 [place] Prevention in the Home Falls can cause injuries. They can happen to people of all ages. There are many things you can do to make your home safe and to help prevent falls. What can I do on the outside of my home? Regularly fix the edges of walkways and driveways and fix any cracks. Remove anything that might make you trip as you walk through a door, such as a raised step or threshold. Trim any bushes or trees on the path to your home. Use bright outdoor lighting. Clear any walking paths of anything that might make someone trip, such as rocks or tools. Regularly check to see if handrails are loose or broken. Make sure that both sides of any steps have handrails. Any raised decks and porches should have guardrails on the edges. Have any leaves, snow, or ice cleared regularly. Use sand or salt on walking paths during winter. Clean up any spills in your garage right away. This includes oil or grease spills. What can I do in the bathroom? Use night lights. Install grab bars by the toilet and in the tub and shower. Do not use towel bars as grab bars. Use non-skid mats or decals in the tub or shower. If you need to sit down in the shower, use a plastic, non-slip stool. Keep the floor dry. Clean up any water that spills on the floor as soon as it happens. Remove soap buildup in the tub or shower regularly. Attach bath mats securely with double-sided non-slip rug tape. Do not have throw  rugs and other things on the floor that can make you trip. What can I do in the bedroom? Use night lights. Make sure that you have a light by your bed that is easy to reach. Do not use any sheets or blankets that are too big for your bed. They should not hang down onto the floor. Have a firm chair that has side arms. You can use this for support while you get dressed. Do not have throw rugs and other things on the floor that can make you trip. What can I do in the kitchen? Clean up any spills right away. Avoid walking on wet floors. Keep items that you use a lot in easy-to-reach places. If you need to reach something above you, use a strong step stool that has a grab bar. Keep electrical cords out of the way. Do not use floor polish or wax that makes floors slippery. If you must use wax, use non-skid floor wax. Do not have throw rugs and other things on the floor that can make you trip. What can I do with my stairs? Do not leave any items on the stairs. Make sure that there are handrails on both sides of the stairs and use them. Fix handrails that are broken or loose. Make sure that handrails are as long as the stairways. Check any carpeting to make sure that it is firmly attached to the stairs. Fix any carpet that is loose or worn. Avoid  having throw rugs at the top or bottom of the stairs. If you do have throw rugs, attach them to the floor with carpet tape. Make sure that you have a light switch at the top of the stairs and the bottom of the stairs. If you do not have them, ask someone to add them for you. What else can I do to help prevent falls? Wear shoes that: Do not have high heels. Have rubber bottoms. Are comfortable and fit you well. Are closed at the toe. Do not wear sandals. If you use a stepladder: Make sure that it is fully opened. Do not climb a closed stepladder. Make sure that both sides of the stepladder are locked into place. Ask someone to hold it for you, if  possible. Clearly mark and make sure that you can see: Any grab bars or handrails. First and last steps. Where the edge of each step is. Use tools that help you move around (mobility aids) if they are needed. These include: Canes. Walkers. Scooters. Crutches. Turn on the lights when you go into a dark area. Replace any light bulbs as soon as they burn out. Set up your furniture so you have a clear path. Avoid moving your furniture around. If any of your floors are uneven, fix them. If there are any pets around you, be aware of where they are. Review your medicines with your doctor. Some medicines can make you feel dizzy. This can increase your chance of falling. Ask your doctor what other things that you can do to help prevent falls. This information is not intended to replace advice given to you by your health care provider. Make sure you discuss any questions you have with your health care provider. Document Released: 11/08/2008 Document Revised: 06/20/2015 Document Reviewed: 02/16/2014 Elsevier Interactive Patient Education  2017 [employer].

## 2021-04-25 ENCOUNTER — Ambulatory Visit: Payer: Medicare PPO | Admitting: Family Medicine

## 2021-04-25 ENCOUNTER — Encounter: Payer: Self-pay | Admitting: Family Medicine

## 2021-04-25 VITALS — BP 130/70 | HR 75 | Temp 97.9°F | Ht 61.0 in | Wt 142.1 lb

## 2021-04-25 DIAGNOSIS — R233 Spontaneous ecchymoses: Secondary | ICD-10-CM

## 2021-04-25 DIAGNOSIS — E7800 Pure hypercholesterolemia, unspecified: Secondary | ICD-10-CM

## 2021-04-25 DIAGNOSIS — M25512 Pain in left shoulder: Secondary | ICD-10-CM | POA: Diagnosis not present

## 2021-04-25 DIAGNOSIS — M791 Myalgia, unspecified site: Secondary | ICD-10-CM

## 2021-04-25 DIAGNOSIS — R739 Hyperglycemia, unspecified: Secondary | ICD-10-CM | POA: Diagnosis not present

## 2021-04-25 DIAGNOSIS — M542 Cervicalgia: Secondary | ICD-10-CM

## 2021-04-25 DIAGNOSIS — I10 Essential (primary) hypertension: Secondary | ICD-10-CM

## 2021-04-25 LAB — COMPREHENSIVE METABOLIC PANEL
ALT: 18 U/L (ref 0–35)
AST: 20 U/L (ref 0–37)
Albumin: 4.6 g/dL (ref 3.5–5.2)
Alkaline Phosphatase: 85 U/L (ref 39–117)
BUN: 21 mg/dL (ref 6–23)
CO2: 28 mEq/L (ref 19–32)
Calcium: 10 mg/dL (ref 8.4–10.5)
Chloride: 104 mEq/L (ref 96–112)
Creatinine, Ser: 1.05 mg/dL (ref 0.40–1.20)
GFR: 51.4 mL/min — ABNORMAL LOW (ref >60.00)
Glucose, Bld: 115 mg/dL — ABNORMAL HIGH (ref 70–99)
Potassium: 4.8 mEq/L (ref 3.5–5.1)
Sodium: 138 mEq/L (ref 135–145)
Total Bilirubin: 0.3 mg/dL (ref 0.2–1.2)
Total Protein: 7.3 g/dL (ref 6.0–8.3)

## 2021-04-25 LAB — PROTIME-INR
INR: 1 ratio (ref 0.8–1.0)
Prothrombin Time: 10.9 s (ref 9.6–13.1)

## 2021-04-25 LAB — CBC WITH DIFFERENTIAL/PLATELET
Basophils Absolute: 0.1 10*3/uL (ref 0.0–0.1)
Basophils Relative: 1.4 % (ref 0.0–3.0)
Eosinophils Absolute: 0.1 10*3/uL (ref 0.0–0.7)
Eosinophils Relative: 2.2 % (ref 0.0–5.0)
HCT: 40.3 % (ref 36.0–46.0)
Hemoglobin: 13.8 g/dL (ref 12.0–15.0)
Lymphocytes Relative: 29.6 % (ref 12.0–46.0)
Lymphs Abs: 1.4 10*3/uL (ref 0.7–4.0)
MCHC: 34.2 g/dL (ref 30.0–36.0)
MCV: 92 fl (ref 78.0–100.0)
Monocytes Absolute: 0.4 10*3/uL (ref 0.1–1.0)
Monocytes Relative: 8.6 % (ref 3.0–12.0)
Neutro Abs: 2.8 10*3/uL (ref 1.4–7.7)
Neutrophils Relative %: 58.2 % (ref 43.0–77.0)
Platelets: 254 10*3/uL (ref 150.0–400.0)
RBC: 4.38 Mil/uL (ref 3.87–5.11)
RDW: 12.7 % (ref 11.5–15.5)
WBC: 4.8 10*3/uL (ref 4.0–10.5)

## 2021-04-25 LAB — LIPID PANEL
Cholesterol: 194 mg/dL (ref 0–200)
HDL: 47.8 mg/dL (ref >39.00)
Total CHOL/HDL Ratio: 4
Triglycerides: 436 mg/dL — ABNORMAL HIGH (ref 0.0–149.0)

## 2021-04-25 LAB — SEDIMENTATION RATE: Sed Rate: 19 mm/hr (ref 0–30)

## 2021-04-25 LAB — LDL CHOLESTEROL, DIRECT: Direct LDL: 113 mg/dL

## 2021-04-25 LAB — CK: Total CK: 72 U/L (ref 7–177)

## 2021-04-25 LAB — FERRITIN: Ferritin: 42.8 ng/mL (ref 10.0–291.0)

## 2021-04-25 MED ORDER — BUPROPION HCL ER (XL) 300 MG PO TB24: ORAL_TABLET | ORAL | 1 refills | Status: DC

## 2021-04-25 NOTE — Patient Instructions (Addendum)
*  consider arnica roll on for bruising ?

## 2021-04-25 NOTE — Progress Notes (Signed)
?[name] [name] ?DOB: 29-Nov-1945 ?Encounter date: 04/25/2021 ? ?This is a 76 y.o. female who presents with ?Chief Complaint  ?Patient presents with  ? Follow-up  ? noticed darker skin color right forearm 5 days ago  ? ? ?History of present illness: ?Did limited activity and didn't note arm injury until after when she had complete bruising left forearm>right. Continues to bruise easily. Just wants to make sure this is not an issue or something to be worried about. She is taking plavix and asa '81mg'$  daily.  ? ?She still has difficulty with raising left arm. Thinks coming more from neck than shoulder. Popped other day when turning head to side. Wondering about next step. Not really improvement with home exercises.  ? ?Allergies  ?Allergen Reactions  ? Sulfa Antibiotics Swelling  ? ?Current Meds  ?Medication Sig  ? aspirin EC 81 MG tablet Take 1 tablet (81 mg total) by mouth daily.  ? buPROPion (WELLBUTRIN XL) 300 MG 24 hr tablet TAKE 1 TABLET BY MOUTH ONCE DAILY . PATIENT NEEDS TO SCHEDULE PHYSICAL EXAM  ? Cholecalciferol (VITAMIN D3) 25 MCG (1000 UT) CAPS Take 1 capsule by mouth daily.  ? clopidogrel (PLAVIX) 75 MG tablet Take 1 tablet (75 mg total) by mouth daily.  ? lisinopril (ZESTRIL) 10 MG tablet TAKE 1 TABLET BY MOUTH ONCE DAILY. PATIENT NEEDS TO SCHEDULE PHYSICAL EXAM  ? meclizine (ANTIVERT) 25 MG tablet Take 1 tablet (25 mg total) by mouth 3 (three) times daily as needed for dizziness.  ? metoprolol succinate (TOPROL-XL) 50 MG 24 hr tablet TAKE 1 TABLET BY MOUTH ONCE DAILY WITH  MEALS  OR  IMMEDIATELY  FOLLOWING  A  MEAL.  ? pantoprazole (PROTONIX) 40 MG tablet TAKE 1 TABLET BY MOUTH ONCE DAILY. APPOINTMENT REQUIRED FOR FUTURE REFILLS.  ? traZODone (DESYREL) 50 MG tablet TAKE 1/2 TO 1 (ONE-HALF TO ONE) TABLET BY MOUTH AT BEDTIME AS NEEDED  ? vitamin B-12 (CYANOCOBALAMIN) 1000 MCG tablet Take 1,000 mcg by mouth daily.  ? ? ?Review of Systems  ?Constitutional:  Negative for chills, fatigue and fever.  ?Respiratory:   Negative for cough, chest tightness, shortness of breath and wheezing.   ?Cardiovascular:  Negative for chest pain, palpitations and leg swelling.  ?Musculoskeletal:  Positive for arthralgias, neck pain and neck stiffness.  ?Skin:   ?     Has had somewhat easy bruising on back of hands for years, but now seems more excessive. Without known injury or pressure on forearms, is getting extensive bruising up forearms. She has even been more conscious of activities and still getting full arm bruising.  ? ?Objective: ? ?BP 130/70 (BP Location: Left Arm, Patient Position: Sitting, Cuff Size: Normal)   Pulse 75   Temp 97.9 ?F (36.6 ?C) (Oral)   Ht '5\' 1"'$  (1.549 m)   Wt 142 lb 1.6 oz (64.5 kg)   SpO2 98%   BMI 26.85 kg/m?   Weight: 142 lb 1.6 oz (64.5 kg)  ? ?BP Readings from Last 3 Encounters:  ?04/25/21 130/70  ?01/15/21 120/70  ?11/25/20 138/84  ? ?Wt Readings from Last 3 Encounters:  ?04/25/21 142 lb 1.6 oz (64.5 kg)  ?03/31/21 145 lb (65.8 kg)  ?01/15/21 144 lb 1.6 oz (65.4 kg)  ? ? ?Physical Exam ?Constitutional:   ?   General: She is not in acute distress. ?   Appearance: She is well-developed.  ?Neck:  ?   Comments: Decreased left turn, slightly limited flexion, pain with right tilt. Bilat trap tenderness;  paracervical tenderness. Negative spurlings  ?Cardiovascular:  ?   Rate and Rhythm: Normal rate and regular rhythm.  ?   Heart sounds: Normal heart sounds. No murmur heard. ?  No friction rub.  ?Pulmonary:  ?   Effort: Pulmonary effort is normal. No respiratory distress.  ?   Breath sounds: Normal breath sounds. No wheezing or rales.  ?Musculoskeletal:  ?   Right lower leg: No edema.  ?   Left lower leg: No edema.  ?Neurological:  ?   Mental Status: She is alert and oriented to person, place, and time.  ?Psychiatric:     ?   Behavior: Behavior normal.  ? ? ?Assessment/Plan ? ?1. Easy bruising ?Start with bloodwork. Will reach out to cardiology to see if she could change dosing asa to help decrease bruising. ?-  CBC with Differential/Platelet; Future ?- Protime-INR ?- Ferritin; Future ?- Ferritin ?- CBC with Differential/Platelet ? ?2. Essential hypertension ?Stable. Continue current meds. ?- CBC with Differential/Platelet; Future ?- Comprehensive metabolic panel; Future ?- Comprehensive metabolic panel ?- CBC with Differential/Platelet ? ?3. HYPERCHOLESTEROLEMIA ?Recheck today. She is now about 6 mo out from [place] 8 study. Was on inclisiran. Has had statin intolerance in past.  ?- Lipid panel; Future ?- Lipid panel ? ?4. Left shoulder pain, unspecified chronicity ?- Ambulatory referral to Physical Therapy ? ?5. Neck pain ?Let me know if not improving with therapy in 4 weeks time.  ?- Ambulatory referral to Physical Therapy ? ?6. Muscle pain ?At end of visit complains of muscle pains - legs, arms. Will check some bloodwork and consider further eval pending results. ?- Sedimentation rate; Future ?- CK ?- Sedimentation rate ? ?7. Hyperglycemia ?- Hemoglobin A1c; Future ? ? ?Return for pending lab or imaging results. ? ? ? ? ?Micheline Rough, MD ?

## 2021-04-28 ENCOUNTER — Other Ambulatory Visit: Payer: Medicare PPO

## 2021-04-28 DIAGNOSIS — R739 Hyperglycemia, unspecified: Secondary | ICD-10-CM

## 2021-04-28 LAB — HEMOGLOBIN A1C: Hgb A1c MFr Bld: 6.3 % (ref 4.6–6.5)

## 2021-05-02 ENCOUNTER — Other Ambulatory Visit: Payer: Self-pay | Admitting: Family Medicine

## 2021-05-02 DIAGNOSIS — E7800 Pure hypercholesterolemia, unspecified: Secondary | ICD-10-CM

## 2021-05-02 MED ORDER — INCLISIRAN SODIUM 284 MG/1.5ML ~~LOC~~ SOSY: PREFILLED_SYRINGE | SUBCUTANEOUS | 2 refills | Status: DC

## 2021-05-07 ENCOUNTER — Ambulatory Visit: Payer: Medicare PPO

## 2021-05-07 NOTE — Therapy (Signed)
?OUTPATIENT PHYSICAL THERAPY CERVICAL EVALUATION ? ? ?Patient Name: [name] [name] ?MRN: [id number] ?DOB:02/27/1945, 76 y.o., female ?Today's Date: 05/08/2021 ? ? PT End of Session - 05/08/21 1359   ? ? Visit Number 1   ? Date for PT Re-Evaluation 07/03/21   ? Authorization Type [employer] will submit   ? PT Start Time 1400   ? PT Stop Time 1440   ? PT Time Calculation (min) 40 min   ? Activity Tolerance Patient tolerated treatment well   ? ?  ?  ? ?  ? ? ?Past Medical History:  ?Diagnosis Date  ? Aorto-iliac atherosclerosis ([place])   ? last aaa duplex 08-30-2015  >50% stenosis bilateral common iliac arteries and left external iliac artery  ? Arrhythmia   ? Carotid stenosis, bilateral   ? cardiologist-- dr Stanford Breed ([place] 08-06-2015)  04-29-2017 followed by pcp/  last duplex 03-08-2017 bilateral ICA 40-59% post endarterectomy,  right ECA occluded  ? Cerebral vascular disease   ? Chronic gastritis   ? Elevated cholesterol   ? Esophageal stricture   ? Full dentures   ? GERD (gastroesophageal reflux disease)   ? Hiatal hernia   ? History of aortic valve disorder   ? per cardiology note dated 08-06-2015 by dr Stanford Breed pt has hx small fibroblastoma aortic valve per echo 2008 /  last echo 08-23-2015 no fibroblastoma noted or stenosis or thickened/ calcification  ? History of gastric ulcer 2013  ? History of nuclear stress test 06-17-2006   per dr Stanford Breed note dated 08-06-2015 , normal study  ? History of transient ischemic attack (TIA) 04/25/2006  ? severe left ICA stenosis/   04-29-2017 per pt no residual  ? HLD (hyperlipidemia)   ? HTN (hypertension)   ? IBS (irritable bowel syndrome)   ? OA (osteoarthritis)   ? Pneumonia   ? Prolapsed internal hemorrhoids, grade 3   ? PVD (peripheral vascular disease) ([place])   ? Rectal bleed   ? SYMPTOM, ECCHYMOSES, SPONTANEOUS 06/16/2006  ? Qualifier: Diagnosis of  By: Leanne Chang MD, Bruce    ? Vertigo   ? Wears contact lenses   ? ?Past Surgical History:  ?Procedure Laterality Date  ? CAROTID  ENDARTERECTOMY Bilateral left 04-29-2006 and right 08-17-2007 by  dr c. Scot Dock  [facility]  ? HEMORRHOID SURGERY N/A 05/07/2017  ? Procedure: ANAL EXAM UNDER ANESTHESIA, HEMORRHOIDECTOMY;  Surgeon: Leighton Ruff, MD;  Location: [facility];  Service: General;  Laterality: N/A;  ? NASAL SINUS SURGERY  age 15  ? TONSILLECTOMY  age 24  ? TRANSTHORACIC ECHOCARDIOGRAM  08/23/2015  ? ef 63-87%, grade 1 diastolic dysfunction/ mild AR and MR  ? VASCULAR SURGERY Bilateral   ? carotids  ? WRIST GANGLION EXCISION Right yrs ago  ? ?Patient Active Problem List  ? Diagnosis Date Noted  ? CAROTID BRUIT 11/21/2008  ? HYPERCHOLESTEROLEMIA 11/16/2008  ? Osteoarthritis 11/16/2008  ? Hyperlipemia 06/16/2006  ? Essential hypertension 06/16/2006  ? Cerebrovascular disease 06/16/2006  ? GERD 06/16/2006  ? ? ?PCP: Caren Macadam, MD ? ?REFERRING PROVIDER: Caren Macadam, MD ? ?REFERRING DIAG:  ?M25.512 (ICD-10-CM) - Left shoulder pain, unspecified chronicity  ?M54.2 (ICD-10-CM) - Neck pain  ? ? ?THERAPY DIAG:  ?Left shoulder pain, neck pain ? ?ONSET DATE: 01/26/21 ? ?SUBJECTIVE:                                                                                                                                                                                                        ? ?  SUBJECTIVE STATEMENT: ?4-5 months of left shoulder pain.  Had a cortisone shot in left shoulder for tendonitis and helped go back a little further but can't get it behind the head. "I think it's coming from my neck."    ? ?PERTINENT HISTORY:  ?Goes by [name] [name]; mild strokes (takes blood thinner)  ? ?PAIN:  ?PAIN:  ?Are you having pain? Yes ?NPRS scale: 9/10 ?Pain location: across shoulders (upper traps) ?  ?Aggravating factors: overhead; leaning on left elbow to get out of bed; can't go behind the back; turning head to the right or left; when neck gets tired  ?Relieving factors: hold close in to side and pulling forward   ? ?PRECAUTIONS: None ? ?WEIGHT  BEARING RESTRICTIONS No ? ?FALLS:  ?Has patient fallen in last 6 months? No ? ?LIVING ENVIRONMENT: ?Lives with: lives with their spouse ?OCCUPATION: retired; Warden/ranger 1x/week;  volunteers at [employer]  ? ?PLOF: Independent ? ?PATIENT GOALS ease up my neck popping at church to speak to somebody ? ?OBJECTIVE:  ? ?DIAGNOSTIC FINDINGS:  ?"It's tendonitis"  ? ?PATIENT SURVEYS:  ?FOTO 46% ? ? ?COGNITION: ?Overall cognitive status: Within functional limits for tasks assessed ? ? ?SENSATION: ?WFL ? ?POSTURE:  ?WFLs ? ?PALPATION: ?Tender points in bil upper traps and bil cervical paraspinals  ? ?CERVICAL ROM:  ? ?Active ROM A/PROM (deg) ?05/08/2021  ?Flexion 35  ?Extension 25  ?Right lateral flexion 18  ?Left lateral flexion 15  ?Right rotation 30  ?Left rotation 18  ? (Blank rows = not tested) ? ?UE ROM:  in supine shoulder flexion to 120 degrees limited by pain ? ?Active ROM Right ?4/13 Left ?4/13  ?Shoulder flexion 147 108  ?Shoulder extension    ?Shoulder abduction 168 75  ?Shoulder adduction    ?Shoulder extension    ?Shoulder internal rotation T10 Left iliac crest  ?Shoulder external rotation C7 Left top of shoulder   ?Elbow flexion    ?Elbow extension    ?Wrist flexion    ?Wrist extension    ?Wrist ulnar deviation    ?Wrist radial deviation    ?Wrist pronation    ?Wrist supination    ? (Blank rows = not tested) ? ?UE MMT: ? ?MMT Right ?4/13 Left ?4/13  ?Shoulder flexion 5 3  ?Shoulder extension 5   ?Shoulder abduction 5 3  ?Shoulder adduction    ?Shoulder extension 5   ?Shoulder internal rotation 5 3  ?Shoulder external rotation 5 3  ?Middle trapezius    ?Lower trapezius    ?Elbow flexion    ?Elbow extension    ?Wrist flexion    ?Wrist extension    ?Wrist ulnar deviation    ?Wrist radial deviation    ?Wrist pronation    ?Wrist supination    ?Grip strength    ? (Blank rows = not tested) ? ?CERVICAL SPECIAL TESTS:  ?Distraction test: Positive ? ? ? ? ?PATIENT SURVEYS:  ?FOTO 46% ? ?TODAY'S TREATMENT:   ?Discussion of treatment plan and benefits of DN ? ? ?PATIENT EDUCATION:  ?Education details: DN basic info ?Person educated: Patient ?Education method: Explanation and Handouts ?Education comprehension: verbalized understanding ? ? ?HOME EXERCISE PROGRAM: ?Will start next visit ? ?ASSESSMENT: ? ?CLINICAL IMPRESSION: ?Patient is a 76 y.o. female who was seen today for physical therapy evaluation and treatment for left shoulder and neck pain.  Her symptoms started about 4-5 months ago for no apparent reason.  She reports the pain is across both shoulders (upper trap region) and  her neck.  The pain is aggravated with turning her head to the right or left, pushing with her elbows to get out of bed and also with reaching overhead.  She complains of crepitus in her neck with turning her head.  Her cervical ROM is limited in all planes of motion.  Left shoulder ROM limited as well in all planes overhead and behind the back.  Left shoulder strength grossly 3/5.  +cervical distraction test.  Multiple tender points and spasm in left > right upper trap.  She would benefit from PT to address these deficits ? ? ?OBJECTIVE IMPAIRMENTS decreased ROM, decreased strength, hypomobility, impaired perceived functional ability, impaired UE functional use, and pain.  ? ?ACTIVITY LIMITATIONS cleaning, driving, meal prep, occupation, and laundry.  ? ?PERSONAL FACTORS Time since onset of injury/illness/exacerbation are also affecting patient's functional outcome.  ? ? ?REHAB POTENTIAL: Good ? ?CLINICAL DECISION MAKING: Stable/uncomplicated ? ?EVALUATION COMPLEXITY: Low ? ? ?GOALS: ?Goals reviewed with patient? Yes ? ?SHORT TERM GOALS: Target date: 06/05/2021 ? ?The patient will demonstrate knowledge of basic self care strategies and exercises to promote healing   ?Baseline:  ?Goal status: INITIAL ? ?2.  The patient will have improved shoulder elevation ROM to at least 120 degrees needed for grooming/dressing purposes as well as reaching  high shelves  ?Baseline:  ?Goal status: INITIAL ? ?3.  Cervical rotation and sidebending ROM improved to 30 degrees needed for driving ?Baseline:  ?Goal status: INITIAL ? ?4.  The patient will report a 40% improvement

## 2021-05-08 ENCOUNTER — Encounter: Payer: Self-pay | Admitting: Physical Therapy

## 2021-05-08 ENCOUNTER — Telehealth: Payer: Self-pay | Admitting: *Deleted

## 2021-05-08 ENCOUNTER — Ambulatory Visit: Payer: Medicare PPO | Attending: Family Medicine | Admitting: Physical Therapy

## 2021-05-08 DIAGNOSIS — M6281 Muscle weakness (generalized): Secondary | ICD-10-CM | POA: Insufficient documentation

## 2021-05-08 DIAGNOSIS — M542 Cervicalgia: Secondary | ICD-10-CM | POA: Diagnosis not present

## 2021-05-08 DIAGNOSIS — M25512 Pain in left shoulder: Secondary | ICD-10-CM | POA: Diagnosis not present

## 2021-05-08 NOTE — Telephone Encounter (Signed)
Prior auth for Leqvio '284mg'$ /1.36m syringes was sent to [email or web address]-Key: [id number]. ?

## 2021-05-09 ENCOUNTER — Other Ambulatory Visit: Payer: Self-pay | Admitting: Family Medicine

## 2021-05-12 NOTE — Therapy (Signed)
?OUTPATIENT PHYSICAL THERAPY TREATMENT NOTE ? ? ?Patient Name: [name] [name] ?MRN: [id number] ?DOB:[birth year], 76 y.o., female ?Today's Date: 05/13/2021 ? ?PCP: Caren Macadam, MD ?REFERRING PROVIDER: Caren Macadam, MD ? ?END OF SESSION:  ? PT End of Session - 05/13/21 1315   ? ? Visit Number 2   ? Date for PT Re-Evaluation 07/03/21   ? Authorization Type Cohere: 12 visits approved 4/13-5/26   ? Authorization - Visit Number 2   ? Authorization - Number of Visits 12   ? Progress Note Due on Visit 20   ? PT Start Time 1231   ? PT Stop Time 1316   ? PT Time Calculation (min) 45 min   ? Activity Tolerance Patient tolerated treatment well   ? Behavior During Therapy [facility] for tasks assessed/performed   ? ?  ?  ? ?  ? ? ?Past Medical History:  ?Diagnosis Date  ? Aorto-iliac atherosclerosis ([place])   ? last aaa duplex 08-30-2015  >50% stenosis bilateral common iliac arteries and left external iliac artery  ? Arrhythmia   ? Carotid stenosis, bilateral   ? cardiologist-- dr Stanford Breed ([place] 08-06-2015)  04-29-2017 followed by pcp/  last duplex 03-08-2017 bilateral ICA 40-59% post endarterectomy,  right ECA occluded  ? Cerebral vascular disease   ? Chronic gastritis   ? Elevated cholesterol   ? Esophageal stricture   ? Full dentures   ? GERD (gastroesophageal reflux disease)   ? Hiatal hernia   ? History of aortic valve disorder   ? per cardiology note dated 08-06-2015 by dr Stanford Breed pt has hx small fibroblastoma aortic valve per echo 2008 /  last echo 08-23-2015 no fibroblastoma noted or stenosis or thickened/ calcification  ? History of gastric ulcer 2013  ? History of nuclear stress test 06-17-2006   per dr Stanford Breed note dated 08-06-2015 , normal study  ? History of transient ischemic attack (TIA) 04/25/2006  ? severe left ICA stenosis/   04-29-2017 per pt no residual  ? HLD (hyperlipidemia)   ? HTN (hypertension)   ? IBS (irritable bowel syndrome)   ? OA (osteoarthritis)   ? Pneumonia   ? Prolapsed internal hemorrhoids,  grade 3   ? PVD (peripheral vascular disease) ([place])   ? Rectal bleed   ? SYMPTOM, ECCHYMOSES, SPONTANEOUS 06/16/2006  ? Qualifier: Diagnosis of  By: Leanne Chang MD, Bruce    ? Vertigo   ? Wears contact lenses   ? ?Past Surgical History:  ?Procedure Laterality Date  ? CAROTID ENDARTERECTOMY Bilateral left 04-29-2006 and right 08-17-2007 by  dr c. Scot Dock  [facility]  ? HEMORRHOID SURGERY N/A 05/07/2017  ? Procedure: ANAL EXAM UNDER ANESTHESIA, HEMORRHOIDECTOMY;  Surgeon: Leighton Ruff, MD;  Location: [facility];  Service: General;  Laterality: N/A;  ? NASAL SINUS SURGERY  age 24  ? TONSILLECTOMY  age 37  ? TRANSTHORACIC ECHOCARDIOGRAM  08/23/2015  ? ef 67-89%, grade 1 diastolic dysfunction/ mild AR and MR  ? VASCULAR SURGERY Bilateral   ? carotids  ? WRIST GANGLION EXCISION Right yrs ago  ? ?Patient Active Problem List  ? Diagnosis Date Noted  ? CAROTID BRUIT 11/21/2008  ? HYPERCHOLESTEROLEMIA 11/16/2008  ? Osteoarthritis 11/16/2008  ? Hyperlipemia 06/16/2006  ? Essential hypertension 06/16/2006  ? Cerebrovascular disease 06/16/2006  ? GERD 06/16/2006  ? ? ?REFERRING DIAG:  ?M25.512 (ICD-10-CM) - Left shoulder pain, unspecified chronicity  ?M54.2 (ICD-10-CM) - Neck pain  ? ? ?THERAPY DIAG:  ?Left shoulder pain, unspecified chronicity ? ?Cervicalgia ? ?  Muscle weakness (generalized) ? ?PERTINENT HISTORY: Goes by [name]; mild strokes (takes blood thinner)  ? ?PRECAUTIONS: none ? ?SUBJECTIVE: I'm doing OK.  I am having pain though.  ? ?PAIN:  ?Are you having pain? Yes: NPRS scale: 8-9/10 ?Pain location: bil shoulders and neck ?Pain description: aching, constant  ?Aggravating factors: it always hurts, movment ?Relieving factors: nothing helps  ? ? ?OBJECTIVE: from evaluation on 05/08/21 ?  ?DIAGNOSTIC FINDINGS:  ?"It's tendonitis"  ?  ?PATIENT SURVEYS:  ?FOTO 46% ? ?COGNITION: ?Overall cognitive status: Within functional limits for tasks assessed ?  ?SENSATION: ?WFL ?  ?POSTURE:  ?WFLs ?  ?PALPATION: ?Tender points in  bil upper traps and bil cervical paraspinals     ?  ?CERVICAL ROM:  ?  ?Active ROM A/PROM (deg) ?05/08/2021  ?Flexion 35  ?Extension 25  ?Right lateral flexion 18  ?Left lateral flexion 15  ?Right rotation 30  ?Left rotation 18  ? (Blank rows = not tested) ?  ?UE ROM:  in supine shoulder flexion to 120 degrees limited by pain ?  ?Active ROM Right ?4/13 Left ?4/13  ?Shoulder flexion 147 108  ?Shoulder extension      ?Shoulder abduction 168 75  ?Shoulder adduction      ?Shoulder extension      ?Shoulder internal rotation T10 Left iliac crest  ?Shoulder external rotation C7 Left top of shoulder   ?Elbow flexion      ?Elbow extension      ?Wrist flexion      ?Wrist extension      ?Wrist ulnar deviation      ?Wrist radial deviation      ?Wrist pronation      ?Wrist supination      ? (Blank rows = not tested) ?  ?UE MMT: ?  ?MMT Right ?4/13 Left ?4/13  ?Shoulder flexion 5 3  ?Shoulder extension 5    ?Shoulder abduction 5 3  ?Shoulder adduction      ?Shoulder extension 5    ?Shoulder internal rotation 5 3  ?Shoulder external rotation 5 3  ?Middle trapezius      ?Lower trapezius      ?Elbow flexion      ?Elbow extension      ?Wrist flexion      ?Wrist extension      ?Wrist ulnar deviation      ?Wrist radial deviation      ?Wrist pronation      ?Wrist supination      ?Grip strength      ? (Blank rows = not tested) ?  ?CERVICAL SPECIAL TESTS:  ?Distraction test: Positive ?  ?PATIENT SURVEYS:  ?FOTO 46% ?  ?TODAY'S TREATMENT:  ?Treatment on date:  ?Cervical A/ROM 3 ways: gentle in sitting and 1 set in supine (rotation and sidebending) ?Shoulder rolls x10 ?Trigger Point Dry-Needling  ?Treatment instructions: Expect mild to moderate muscle soreness. S/S of pneumothorax if dry needled over a lung field, and to seek immediate medical attention should they occur. Patient verbalized understanding of these instructions and education. ? ?Patient Consent Given: Yes ?Education handout provided: Previously provided ?Muscles treated:  cervical multifidi, upper traps ?Treatment response/outcome: twitch response and improved tissue mobility  ?Elongation and trigger point release to neck after needling.  ?Skilled palpation and monitoring by PT during dry needling  ?Treatment 05/08/21 ?Discussion of treatment plan and benefits of DN ?  ?  ?PATIENT EDUCATION:  ?Education details:[email or web address] ?Person educated: Patient ?Education method: Explanation and Handouts ?Education comprehension: verbalized understanding ?  ?  ?  HOME EXERCISE PROGRAM: ?Access Code: [email or web address] ?URL: [email or web address] ?Date: 05/13/2021 ?Prepared by: Claiborne Billings ? ?Exercises ?- Seated Cervical Flexion AROM  - 3 x daily - 7 x weekly - 1 sets - 3 reps - 20 hold ?- Seated Cervical Sidebending AROM  - 3 x daily - 7 x weekly - 1 sets - 3 reps - 20 hold ?- Seated Cervical Rotation AROM  - 3 x daily - 7 x weekly - 1 sets - 3 reps - 20 hold ?- Seated Correct Posture  - 1 x daily - 7 x weekly - 3 sets - 10 reps ?- Seated Shoulder Rolls  - 5 x daily - 7 x weekly - 1 sets - 10 reps ?  ?ASSESSMENT: ?  ?CLINICAL IMPRESSION: ?First time follow-up after evaluation.  PT initiated HEP for gentle cervical and UE flexibility.  Session focused on manual therapy and dry needling to the neck and upper traps to address significant tension and limited cervical A/ROM. Pt with good twitch response to DN with twitch and improved tissue mobility.   Pt will try cervical A/ROM in supine and seated.  Patient will benefit from skilled PT to address the below impairments and improve overall function.  ?  ?  ?OBJECTIVE IMPAIRMENTS decreased ROM, decreased strength, hypomobility, impaired perceived functional ability, impaired UE functional use, and pain.  ?  ?ACTIVITY LIMITATIONS cleaning, driving, meal prep, occupation, and laundry.  ?  ?PERSONAL FACTORS Time since onset of injury/illness/exacerbation are also affecting patient's functional outcome.  ?  ?  ?REHAB POTENTIAL: Good ?  ?CLINICAL DECISION MAKING:  Stable/uncomplicated ?  ?EVALUATION COMPLEXITY: Low ?  ?  ?GOALS: ?Goals reviewed with patient? Yes ?  ?SHORT TERM GOALS: Target date: 06/05/2021 ?  ?The patient will demonstrate knowledge of basic self care

## 2021-05-13 ENCOUNTER — Ambulatory Visit: Payer: Medicare PPO

## 2021-05-13 DIAGNOSIS — M542 Cervicalgia: Secondary | ICD-10-CM

## 2021-05-13 DIAGNOSIS — M6281 Muscle weakness (generalized): Secondary | ICD-10-CM

## 2021-05-13 DIAGNOSIS — M25512 Pain in left shoulder: Secondary | ICD-10-CM | POA: Diagnosis not present

## 2021-05-13 NOTE — Telephone Encounter (Signed)
Fax received from [facility] stating the request was approved until 10/10/22023 and sent to be scanned.  I spoke with Delana Meyer at [facility], informed her of this and she stated the pharmacy will contact the patient. ?

## 2021-05-15 ENCOUNTER — Ambulatory Visit: Payer: Medicare PPO

## 2021-05-15 DIAGNOSIS — M542 Cervicalgia: Secondary | ICD-10-CM

## 2021-05-15 DIAGNOSIS — M25512 Pain in left shoulder: Secondary | ICD-10-CM

## 2021-05-15 DIAGNOSIS — M6281 Muscle weakness (generalized): Secondary | ICD-10-CM

## 2021-05-15 NOTE — Therapy (Signed)
?OUTPATIENT PHYSICAL THERAPY TREATMENT NOTE ? ? ?Patient Name: [name] [name] ?MRN: [id number] ?DOB:1945/05/11, 76 y.o., female ?Today's Date: 05/15/2021 ? ?PCP: Caren Macadam, MD ?REFERRING PROVIDER: Caren Macadam, MD ? ?END OF SESSION:  ? PT End of Session - 05/15/21 1258   ? ? Visit Number 3   ? Date for PT Re-Evaluation 07/03/21   ? Authorization Type Cohere: 12 visits approved 4/13-5/26   ? Authorization - Visit Number 3   ? Authorization - Number of Visits 12   ? Progress Note Due on Visit 20   ? PT Start Time 1225   ? PT Stop Time 1259   ? PT Time Calculation (min) 34 min   ? Activity Tolerance Patient tolerated treatment well   ? Behavior During Therapy [facility] for tasks assessed/performed   ? ?  ?  ? ?  ? ? ? ?Past Medical History:  ?Diagnosis Date  ? Aorto-iliac atherosclerosis ([place])   ? last aaa duplex 08-30-2015  >50% stenosis bilateral common iliac arteries and left external iliac artery  ? Arrhythmia   ? Carotid stenosis, bilateral   ? cardiologist-- dr Stanford Breed ([place] 08-06-2015)  04-29-2017 followed by pcp/  last duplex 03-08-2017 bilateral ICA 40-59% post endarterectomy,  right ECA occluded  ? Cerebral vascular disease   ? Chronic gastritis   ? Elevated cholesterol   ? Esophageal stricture   ? Full dentures   ? GERD (gastroesophageal reflux disease)   ? Hiatal hernia   ? History of aortic valve disorder   ? per cardiology note dated 08-06-2015 by dr Stanford Breed pt has hx small fibroblastoma aortic valve per echo 2008 /  last echo 08-23-2015 no fibroblastoma noted or stenosis or thickened/ calcification  ? History of gastric ulcer 2013  ? History of nuclear stress test 06-17-2006   per dr Stanford Breed note dated 08-06-2015 , normal study  ? History of transient ischemic attack (TIA) 04/25/2006  ? severe left ICA stenosis/   04-29-2017 per pt no residual  ? HLD (hyperlipidemia)   ? HTN (hypertension)   ? IBS (irritable bowel syndrome)   ? OA (osteoarthritis)   ? Pneumonia   ? Prolapsed internal  hemorrhoids, grade 3   ? PVD (peripheral vascular disease) ([place])   ? Rectal bleed   ? SYMPTOM, ECCHYMOSES, SPONTANEOUS 06/16/2006  ? Qualifier: Diagnosis of  By: Leanne Chang MD, Bruce    ? Vertigo   ? Wears contact lenses   ? ?Past Surgical History:  ?Procedure Laterality Date  ? CAROTID ENDARTERECTOMY Bilateral left 04-29-2006 and right 08-17-2007 by  dr c. Scot Dock  [facility]  ? HEMORRHOID SURGERY N/A 05/07/2017  ? Procedure: ANAL EXAM UNDER ANESTHESIA, HEMORRHOIDECTOMY;  Surgeon: Leighton Ruff, MD;  Location: [place];  Service: General;  Laterality: N/A;  ? NASAL SINUS SURGERY  age 6  ? TONSILLECTOMY  age 54  ? TRANSTHORACIC ECHOCARDIOGRAM  08/23/2015  ? ef 44-96%, grade 1 diastolic dysfunction/ mild AR and MR  ? VASCULAR SURGERY Bilateral   ? carotids  ? WRIST GANGLION EXCISION Right yrs ago  ? ?Patient Active Problem List  ? Diagnosis Date Noted  ? CAROTID BRUIT 11/21/2008  ? HYPERCHOLESTEROLEMIA 11/16/2008  ? Osteoarthritis 11/16/2008  ? Hyperlipemia 06/16/2006  ? Essential hypertension 06/16/2006  ? Cerebrovascular disease 06/16/2006  ? GERD 06/16/2006  ? ? ?REFERRING DIAG:  ?M25.512 (ICD-10-CM) - Left shoulder pain, unspecified chronicity  ?M54.2 (ICD-10-CM) - Neck pain  ? ? ?THERAPY DIAG:  ?Left shoulder pain, unspecified chronicity ? ?  Cervicalgia ? ?Muscle weakness (generalized) ? ?PERTINENT HISTORY: Goes by [name] [name]; mild strokes (takes blood thinner)  ? ?PRECAUTIONS: none ? ?SUBJECTIVE: I can turn my neck farther now.  I was very sore in my upper traps the night of the needling.   ? ?PAIN:  ?Are you having pain? Yes: NPRS scale: 3/10 ?Pain location: bil shoulders and neck ?Pain description: aching, constant  ?Aggravating factors: it always hurts, movment ?Relieving factors: nothing helps  ? ? ?OBJECTIVE: from evaluation on 05/08/21 ?  ?DIAGNOSTIC FINDINGS:  ?"It's tendonitis"  ?  ?PATIENT SURVEYS:  ?FOTO 46% ? ?COGNITION: ?Overall cognitive status: Within functional limits for tasks assessed ?   ?SENSATION: ?WFL ?  ?POSTURE:  ?WFLs ?  ?PALPATION: ?Tender points in bil upper traps and bil cervical paraspinals     ?  ?CERVICAL ROM:  ?  ?Active ROM A/PROM (deg) ?05/08/2021 A/PROM (deg) ?05/15/21  ?Flexion 35 60  ?Extension 25   ?Right lateral flexion 18 25  ?Left lateral flexion 15 35  ?Right rotation 30 40  ?Left rotation 18 50  ? (Blank rows = not tested) ?  ?UE ROM:  in supine shoulder flexion to 120 degrees limited by pain ?  ?Active ROM Right ?4/13 Left ?4/13  ?Shoulder flexion 147 108  ?Shoulder extension      ?Shoulder abduction 168 75  ?Shoulder adduction      ?Shoulder extension      ?Shoulder internal rotation T10 Left iliac crest  ?Shoulder external rotation C7 Left top of shoulder   ?Elbow flexion      ?Elbow extension      ?Wrist flexion      ?Wrist extension      ?Wrist ulnar deviation      ?Wrist radial deviation      ?Wrist pronation      ?Wrist supination      ? (Blank rows = not tested) ?  ?UE MMT: ?  ?MMT Right ?4/13 Left ?4/13  ?Shoulder flexion 5 3  ?Shoulder extension 5    ?Shoulder abduction 5 3  ?Shoulder adduction      ?Shoulder extension 5    ?Shoulder internal rotation 5 3  ?Shoulder external rotation 5 3  ?Middle trapezius      ?Lower trapezius      ?Elbow flexion      ?Elbow extension      ?Wrist flexion      ?Wrist extension      ?Wrist ulnar deviation      ?Wrist radial deviation      ?Wrist pronation      ?Wrist supination      ?Grip strength      ? (Blank rows = not tested) ?  ?CERVICAL SPECIAL TESTS:  ?Distraction test: Positive ?  ?PATIENT SURVEYS:  ?FOTO 46% ?  ?TODAY'S TREATMENT:  ?Treatment on date: 05/15/21: ?Cervical A/ROM 3 ways: gentle in sitting and 1 set in supine (rotation and sidebending) ?Shoulder rolls x10 ?  Manual: elontation to bil neck and upper traps, cervical mobs, PA, sideglide and rotational mobs with movement.   ?Treatment on date: 05/13/21 ?Cervical A/ROM 3 ways: gentle in sitting and 1 set in supine (rotation and sidebending) ?Shoulder rolls x10 ?Trigger  Point Dry-Needling  ?Treatment instructions: Expect mild to moderate muscle soreness. S/S of pneumothorax if dry needled over a lung field, and to seek immediate medical attention should they occur. Patient verbalized understanding of these instructions and education. ? ?Patient Consent Given: Yes ?Education handout provided: Previously  provided ?Muscles treated: cervical multifidi, upper traps ?Treatment response/outcome: twitch response and improved tissue mobility  ?Elongation and trigger point release to neck after needling.  ?Skilled palpation and monitoring by PT during dry needling  ?Treatment 05/08/21 ?Discussion of treatment plan and benefits of DN ?  ?  ?PATIENT EDUCATION:  ?Education details:[email or web address] ?Person educated: Patient ?Education method: Explanation and Handouts ?Education comprehension: verbalized understanding ?  ?  ?HOME EXERCISE PROGRAM: ?Access Code: [email or web address] ?URL: [email or web address] ?Date: 05/13/2021 ?Prepared by: Claiborne Billings ? ?Exercises ?- Seated Cervical Flexion AROM  - 3 x daily - 7 x weekly - 1 sets - 3 reps - 20 hold ?- Seated Cervical Sidebending AROM  - 3 x daily - 7 x weekly - 1 sets - 3 reps - 20 hold ?- Seated Cervical Rotation AROM  - 3 x daily - 7 x weekly - 1 sets - 3 reps - 20 hold ?- Seated Correct Posture  - 1 x daily - 7 x weekly - 3 sets - 10 reps ?- Seated Shoulder Rolls  - 5 x daily - 7 x weekly - 1 sets - 10 reps ?  ?ASSESSMENT: ?  ?CLINICAL IMPRESSION: ?PT arrived with 3/10 pain, significant decrease from last session.  Pt reports that she was very sore after DN last session and feels much better now.  Cervical A/ROM is improved overall today significantly.  Session focused on manual therapy and review of gentle exercises to address significant tension and limited cervical A/ROM.  ?Patient will benefit from skilled PT to address the below impairments and improve overall function.  ?  ?  ?OBJECTIVE IMPAIRMENTS decreased ROM, decreased strength, hypomobility,  impaired perceived functional ability, impaired UE functional use, and pain.  ?  ?ACTIVITY LIMITATIONS cleaning, driving, meal prep, occupation, and laundry.  ?  ?PERSONAL FACTORS Time since onset of injury/illness/exac

## 2021-05-20 ENCOUNTER — Telehealth: Payer: Self-pay | Admitting: Family Medicine

## 2021-05-20 ENCOUNTER — Ambulatory Visit: Payer: Medicare PPO

## 2021-05-20 DIAGNOSIS — M25512 Pain in left shoulder: Secondary | ICD-10-CM

## 2021-05-20 DIAGNOSIS — M542 Cervicalgia: Secondary | ICD-10-CM | POA: Diagnosis not present

## 2021-05-20 DIAGNOSIS — M6281 Muscle weakness (generalized): Secondary | ICD-10-CM

## 2021-05-20 NOTE — Therapy (Signed)
?OUTPATIENT PHYSICAL THERAPY TREATMENT NOTE ? ? ?Patient Name: [name] [name] ?MRN: [id number] ?DOB:May 19, 1945, 76 y.o., female ?Today's Date: 05/20/2021 ? ?PCP: Caren Macadam, MD ?REFERRING PROVIDER: Caren Macadam, MD ? ?END OF SESSION:  ? PT End of Session - 05/20/21 1228   ? ? Visit Number 4   ? Date for PT Re-Evaluation 07/03/21   ? Authorization Type Cohere: 12 visits approved 4/13-5/26   ? Authorization - Visit Number 4   ? Authorization - Number of Visits 12   ? Progress Note Due on Visit 20   ? PT Start Time 1147   ? PT Stop Time 1228   ? PT Time Calculation (min) 41 min   ? Activity Tolerance Patient tolerated treatment well   ? Behavior During Therapy [facility] for tasks assessed/performed   ? ?  ?  ? ?  ? ? ? ? ?Past Medical History:  ?Diagnosis Date  ? Aorto-iliac atherosclerosis ([place])   ? last aaa duplex 08-30-2015  >50% stenosis bilateral common iliac arteries and left external iliac artery  ? Arrhythmia   ? Carotid stenosis, bilateral   ? cardiologist-- dr Stanford Breed ([place] 08-06-2015)  04-29-2017 followed by pcp/  last duplex 03-08-2017 bilateral ICA 40-59% post endarterectomy,  right ECA occluded  ? Cerebral vascular disease   ? Chronic gastritis   ? Elevated cholesterol   ? Esophageal stricture   ? Full dentures   ? GERD (gastroesophageal reflux disease)   ? Hiatal hernia   ? History of aortic valve disorder   ? per cardiology note dated 08-06-2015 by dr Stanford Breed pt has hx small fibroblastoma aortic valve per echo 2008 /  last echo 08-23-2015 no fibroblastoma noted or stenosis or thickened/ calcification  ? History of gastric ulcer 2013  ? History of nuclear stress test 06-17-2006   per dr Stanford Breed note dated 08-06-2015 , normal study  ? History of transient ischemic attack (TIA) 04/25/2006  ? severe left ICA stenosis/   04-29-2017 per pt no residual  ? HLD (hyperlipidemia)   ? HTN (hypertension)   ? IBS (irritable bowel syndrome)   ? OA (osteoarthritis)   ? Pneumonia   ? Prolapsed internal  hemorrhoids, grade 3   ? PVD (peripheral vascular disease) ([place])   ? Rectal bleed   ? SYMPTOM, ECCHYMOSES, SPONTANEOUS 06/16/2006  ? Qualifier: Diagnosis of  By: Leanne Chang MD, Bruce    ? Vertigo   ? Wears contact lenses   ? ?Past Surgical History:  ?Procedure Laterality Date  ? CAROTID ENDARTERECTOMY Bilateral left 04-29-2006 and right 08-17-2007 by  dr c. Scot Dock  [facility]  ? HEMORRHOID SURGERY N/A 05/07/2017  ? Procedure: ANAL EXAM UNDER ANESTHESIA, HEMORRHOIDECTOMY;  Surgeon: Leighton Ruff, MD;  Location: [place];  Service: General;  Laterality: N/A;  ? NASAL SINUS SURGERY  age 62  ? TONSILLECTOMY  age 39  ? TRANSTHORACIC ECHOCARDIOGRAM  08/23/2015  ? ef 93-23%, grade 1 diastolic dysfunction/ mild AR and MR  ? VASCULAR SURGERY Bilateral   ? carotids  ? WRIST GANGLION EXCISION Right yrs ago  ? ?Patient Active Problem List  ? Diagnosis Date Noted  ? CAROTID BRUIT 11/21/2008  ? HYPERCHOLESTEROLEMIA 11/16/2008  ? Osteoarthritis 11/16/2008  ? Hyperlipemia 06/16/2006  ? Essential hypertension 06/16/2006  ? Cerebrovascular disease 06/16/2006  ? GERD 06/16/2006  ? ? ?REFERRING DIAG:  ?M25.512 (ICD-10-CM) - Left shoulder pain, unspecified chronicity  ?M54.2 (ICD-10-CM) - Neck pain  ? ? ?THERAPY DIAG:  ?Left shoulder pain, unspecified chronicity ? ?  Cervicalgia ? ?Muscle weakness (generalized) ? ?PERTINENT HISTORY: Goes by [name] Median; mild strokes (takes blood thinner)  ? ?PRECAUTIONS: none ? ?SUBJECTIVE: I am better overall.  I'm just frustrated that I am still hurting.   ? ?PAIN:  ?Are you having pain? Yes: NPRS scale: 6/10 ?Pain location: bil shoulders and neck ?Pain description: aching, constant  ?Aggravating factors: it always hurts, movment ?Relieving factors: nothing helps  ? ? ?OBJECTIVE: from evaluation on 05/08/21 ?  ?DIAGNOSTIC FINDINGS:  ?"It's tendonitis"  ?  ?PATIENT SURVEYS:  ?FOTO 46% ? ?COGNITION: ?Overall cognitive status: Within functional limits for tasks assessed ?  ?SENSATION: ?WFL ?  ?POSTURE:   ?WFLs ?  ?PALPATION: ?Tender points in bil upper traps and bil cervical paraspinals     ?  ?CERVICAL ROM:  ?  ?Active ROM A/PROM (deg) ?05/08/2021 A/PROM (deg) ?05/15/21  ?Flexion 35 60  ?Extension 25   ?Right lateral flexion 18 25  ?Left lateral flexion 15 35  ?Right rotation 30 40  ?Left rotation 18 50  ? (Blank rows = not tested) ?  ?UE ROM:  in supine shoulder flexion to 120 degrees limited by pain ?  ?Active ROM Right ?4/13 Left ?4/13 Left  ?05/20/21  ?Shoulder flexion 147 108 130  ?Shoulder extension       ?Shoulder abduction 168 75 94  ?Shoulder adduction       ?Shoulder extension       ?Shoulder internal rotation T10 Left iliac crest   ?Shoulder external rotation C7 Left top of shoulder    ?Elbow flexion       ?Elbow extension       ?Wrist flexion       ?Wrist extension       ?Wrist ulnar deviation       ?Wrist radial deviation       ?Wrist pronation       ?Wrist supination       ? (Blank rows = not tested) ?  ?UE MMT: ?  ?MMT Right ?4/13 Left ?4/13  ?Shoulder flexion 5 3  ?Shoulder extension 5    ?Shoulder abduction 5 3  ?Shoulder adduction      ?Shoulder extension 5    ?Shoulder internal rotation 5 3  ?Shoulder external rotation 5 3  ?Middle trapezius      ?Lower trapezius      ?Elbow flexion      ?Elbow extension      ?Wrist flexion      ?Wrist extension      ?Wrist ulnar deviation      ?Wrist radial deviation      ?Wrist pronation      ?Wrist supination      ?Grip strength      ? (Blank rows = not tested) ?  ?CERVICAL SPECIAL TESTS:  ?Distraction test: Positive ?  ?PATIENT SURVEYS:  ?FOTO 46% ?  ?TODAY'S TREATMENT:  ?Treatment on date: 05/20/21 ?Cervical A/ROM 3 ways: gentle in sitting  ?Arm bike: level 1x 6 min (3/3)- PT present to discuss progress ?Biceps curls: 2# 2x10 ?  ?Trigger Point Dry-Needling  ?Treatment instructions: Expect mild to moderate muscle soreness. S/S of pneumothorax if dry needled over a lung field, and to seek immediate medical attention should they occur. Patient verbalized  understanding of these instructions and education. ? ?Patient Consent Given: Yes ?Education handout provided: Previously provided ?Muscles treated: cervical multifidi, upper traps ?Treatment response/outcome: twitch response and improved tissue mobility  ?Elongation and trigger point release to neck after needling.  ?  Skilled palpation and monitoring by PT during dry needling  ?Treatment on date: 05/15/21: ?Cervical A/ROM 3 ways: gentle in sitting and 1 set in supine (rotation and sidebending) ?Shoulder rolls x10 ?  Manual: elontation to bil neck and upper traps, cervical mobs, PA, sideglide and rotational mobs with movement.   ?Treatment on date: 05/13/21 ?Cervical A/ROM 3 ways: gentle in sitting and 1 set in supine (rotation and sidebending) ?Shoulder rolls x10 ?Trigger Point Dry-Needling  ?Treatment instructions: Expect mild to moderate muscle soreness. S/S of pneumothorax if dry needled over a lung field, and to seek immediate medical attention should they occur. Patient verbalized understanding of these instructions and education. ? ?Patient Consent Given: Yes ?Education handout provided: Previously provided ?Muscles treated: cervical multifidi, upper traps ?Treatment response/outcome: twitch response and improved tissue mobility  ?Elongation and trigger point release to neck after needling.  ?Skilled palpation and monitoring by PT during dry needling  ?Treatment 05/08/21 ?Discussion of treatment plan and benefits of DN ?  ?  ?PATIENT EDUCATION:  ?Education details:[id number] ?Person educated: Patient ?Education method: Explanation and Handouts ?Education comprehension: verbalized understanding ?  ?  ?HOME EXERCISE PROGRAM: ?Access Code: [email or web address] ?URL: [email or web address] ?Date: 05/13/2021 ?Prepared by: Claiborne Billings ? ?Exercises ?- Seated Cervical Flexion AROM  - 3 x daily - 7 x weekly - 1 sets - 3 reps - 20 hold ?- Seated Cervical Sidebending AROM  - 3 x daily - 7 x weekly - 1 sets - 3 reps - 20 hold ?-  Seated Cervical Rotation AROM  - 3 x daily - 7 x weekly - 1 sets - 3 reps - 20 hold ?- Seated Correct Posture  - 1 x daily - 7 x weekly - 3 sets - 10 reps ?- Seated Shoulder Rolls  - 5 x daily - 7 x weekly - 1 sets - 10 rep

## 2021-05-20 NOTE — Telephone Encounter (Signed)
Patient is requesting a call back (from Dr. Ethlyn Gallery).  She stated that Dr. Ethlyn Gallery wanted her to call her to let her know how it went with her new dr that Dr. Ethlyn Gallery referred her to. ?

## 2021-05-22 ENCOUNTER — Encounter: Payer: Medicare PPO | Admitting: Physical Therapy

## 2021-05-22 NOTE — Telephone Encounter (Signed)
I called both home and cell and didn't connect with her. If you can get a hold of her and get update/concerns please do so. Let me know if she needs anything. Happy to try again in the future if she wants to talk with me. ?

## 2021-05-26 ENCOUNTER — Telehealth: Payer: Self-pay | Admitting: Family Medicine

## 2021-05-26 NOTE — Telephone Encounter (Signed)
Pt call and stated she is sorry she miss dr.Koberlein call and would like for her to gave her a call back on her home ph ?

## 2021-05-27 ENCOUNTER — Ambulatory Visit: Payer: Medicare PPO | Attending: Family Medicine | Admitting: Physical Therapy

## 2021-05-27 DIAGNOSIS — M6281 Muscle weakness (generalized): Secondary | ICD-10-CM | POA: Insufficient documentation

## 2021-05-27 DIAGNOSIS — M542 Cervicalgia: Secondary | ICD-10-CM | POA: Insufficient documentation

## 2021-05-27 DIAGNOSIS — M25512 Pain in left shoulder: Secondary | ICD-10-CM | POA: Diagnosis not present

## 2021-05-27 NOTE — Therapy (Signed)
?OUTPATIENT PHYSICAL THERAPY TREATMENT NOTE ? ? ?Patient Name: [name] [name] ?MRN: [id number] ?DOB:04-05-1945, 76 y.o., female ?Today's Date: 05/27/2021 ? ?PCP: Caren Macadam, MD ?REFERRING PROVIDER: Caren Macadam, MD ? ?END OF SESSION:  ? PT End of Session - 05/27/21 1143   ? ? Visit Number 5   ? Number of Visits 12   ? Date for PT Re-Evaluation 07/03/21   ? Authorization Type Cohere: 12 visits approved 4/13-5/26   ? Authorization - Visit Number 5   ? Authorization - Number of Visits 12   ? PT Start Time 1145   ? PT Stop Time 1230   ? PT Time Calculation (min) 45 min   ? Activity Tolerance Patient tolerated treatment well   ? ?  ?  ? ?  ? ? ? ? ?Past Medical History:  ?Diagnosis Date  ? Aorto-iliac atherosclerosis ([place])   ? last aaa duplex 08-30-2015  >50% stenosis bilateral common iliac arteries and left external iliac artery  ? Arrhythmia   ? Carotid stenosis, bilateral   ? cardiologist-- dr Stanford Breed ([place] 08-06-2015)  04-29-2017 followed by pcp/  last duplex 03-08-2017 bilateral ICA 40-59% post endarterectomy,  right ECA occluded  ? Cerebral vascular disease   ? Chronic gastritis   ? Elevated cholesterol   ? Esophageal stricture   ? Full dentures   ? GERD (gastroesophageal reflux disease)   ? Hiatal hernia   ? History of aortic valve disorder   ? per cardiology note dated 08-06-2015 by dr Stanford Breed pt has hx small fibroblastoma aortic valve per echo 2008 /  last echo 08-23-2015 no fibroblastoma noted or stenosis or thickened/ calcification  ? History of gastric ulcer 2013  ? History of nuclear stress test 06-17-2006   per dr Stanford Breed note dated 08-06-2015 , normal study  ? History of transient ischemic attack (TIA) 04/25/2006  ? severe left ICA stenosis/   04-29-2017 per pt no residual  ? HLD (hyperlipidemia)   ? HTN (hypertension)   ? IBS (irritable bowel syndrome)   ? OA (osteoarthritis)   ? Pneumonia   ? Prolapsed internal hemorrhoids, grade 3   ? PVD (peripheral vascular disease) ([place])   ? Rectal  bleed   ? SYMPTOM, ECCHYMOSES, SPONTANEOUS 06/16/2006  ? Qualifier: Diagnosis of  By: Leanne Chang MD, Bruce    ? Vertigo   ? Wears contact lenses   ? ?Past Surgical History:  ?Procedure Laterality Date  ? CAROTID ENDARTERECTOMY Bilateral left 04-29-2006 and right 08-17-2007 by  dr c. Scot Dock  [facility]  ? HEMORRHOID SURGERY N/A 05/07/2017  ? Procedure: ANAL EXAM UNDER ANESTHESIA, HEMORRHOIDECTOMY;  Surgeon: Leighton Ruff, MD;  Location: [place];  Service: General;  Laterality: N/A;  ? NASAL SINUS SURGERY  age 72  ? TONSILLECTOMY  age 64  ? TRANSTHORACIC ECHOCARDIOGRAM  08/23/2015  ? ef 26-83%, grade 1 diastolic dysfunction/ mild AR and MR  ? VASCULAR SURGERY Bilateral   ? carotids  ? WRIST GANGLION EXCISION Right yrs ago  ? ?Patient Active Problem List  ? Diagnosis Date Noted  ? CAROTID BRUIT 11/21/2008  ? HYPERCHOLESTEROLEMIA 11/16/2008  ? Osteoarthritis 11/16/2008  ? Hyperlipemia 06/16/2006  ? Essential hypertension 06/16/2006  ? Cerebrovascular disease 06/16/2006  ? GERD 06/16/2006  ? ? ?REFERRING DIAG:  ?M25.512 (ICD-10-CM) - Left shoulder pain, unspecified chronicity  ?M54.2 (ICD-10-CM) - Neck pain  ? ? ?THERAPY DIAG:  ?Left shoulder pain, unspecified chronicity ? ?Cervicalgia ? ?Muscle weakness (generalized) ? ?PERTINENT HISTORY: Goes by [name] Median;  mild strokes (takes blood thinner)  ? ?PRECAUTIONS: none ? ?SUBJECTIVE:   I'm discouraged b/c I think it should get better 24 hours.  I can get my hands to touch the back of the neck.  Turning my head to talk to people still bothers me.  I've used my 1.5# weights and I think that helps.   ?PAIN:  ?Are you having pain? Yes: NPRS scale:  /10 ?Pain location: bil shoulders and neck ?Pain description: aching, constant  ?Aggravating factors: it always hurts, movment ?Relieving factors: nothing helps  ? ? ?OBJECTIVE: from evaluation on 05/08/21 ?  ?DIAGNOSTIC FINDINGS:  ?"It's tendonitis"  ?  ?PATIENT SURVEYS:  ?FOTO 46% ? ?COGNITION: ?Overall cognitive status: Within  functional limits for tasks assessed ?  ?SENSATION: ?WFL ?  ?POSTURE:  ?WFLs ?  ?PALPATION: ?Tender points in bil upper traps and bil cervical paraspinals     ?  ?CERVICAL ROM:  ?  ?Active ROM A/PROM (deg) ?05/08/2021 A/PROM (deg) ?05/15/21  ?Flexion 35 60  ?Extension 25   ?Right lateral flexion 18 25  ?Left lateral flexion 15 35  ?Right rotation 30 40  ?Left rotation 18 50  ? (Blank rows = not tested) ?  ?UE ROM:  in supine shoulder flexion to 120 degrees limited by pain ?  ?Active ROM Right ?4/13 Left ?4/13 Left  ?05/20/21  ?Shoulder flexion 147 108 130  ?Shoulder extension       ?Shoulder abduction 168 75 94  ?Shoulder adduction       ?Shoulder extension       ?Shoulder internal rotation T10 Left iliac crest   ?Shoulder external rotation C7 Left top of shoulder    ?Elbow flexion       ?Elbow extension       ?Wrist flexion       ?Wrist extension       ?Wrist ulnar deviation       ?Wrist radial deviation       ?Wrist pronation       ?Wrist supination       ? (Blank rows = not tested) ?  ?UE MMT: ?  ?MMT Right ?4/13 Left ?4/13  ?Shoulder flexion 5 3  ?Shoulder extension 5    ?Shoulder abduction 5 3  ?Shoulder adduction      ?Shoulder extension 5    ?Shoulder internal rotation 5 3  ?Shoulder external rotation 5 3  ?Middle trapezius      ?Lower trapezius      ?Elbow flexion      ?Elbow extension      ?Wrist flexion      ?Wrist extension      ?Wrist ulnar deviation      ?Wrist radial deviation      ?Wrist pronation      ?Wrist supination      ?Grip strength      ? (Blank rows = not tested) ?  ?CERVICAL SPECIAL TESTS:  ?Distraction test: Positive ?  ?PATIENT SURVEYS:  ?FOTO 46% ?  ?TODAY'S TREATMENT:  ?05/27/21: ?Cervical rotation with towel self mob with movt 10x right/left ?Seated thoracic extension with ball 10x; ?Seated 1.5# biceps curls 10x ?Standing 1.5# bil shrugs 10x ?Standing snatch and press overhead 1.5# bil 10x right/left  ?Discussion on results vs. Time progress ? ? ? ?Treatment on date: 05/20/21 ?Cervical A/ROM  3 ways: gentle in sitting  ?Arm bike: level 1x 6 min (3/3)- PT present to discuss progress ?Biceps curls: 2# 2x10 ?  ?Trigger Point Dry-Needling  ?  Treatment instructions: Expect mild to moderate muscle soreness. S/S of pneumothorax if dry needled over a lung field, and to seek immediate medical attention should they occur. Patient verbalized understanding of these instructions and education. ? ?Patient Consent Given: Yes ?Education handout provided: Previously provided ?Muscles treated: cervical multifidi, upper traps ?Treatment response/outcome: twitch response and improved tissue mobility  ?Elongation and trigger point release to neck after needling.  ?Skilled palpation and monitoring by PT during dry needling  ?Treatment on date: 05/15/21: ?Cervical A/ROM 3 ways: gentle in sitting and 1 set in supine (rotation and sidebending) ?Shoulder rolls x10 ?  Manual: elontation to bil neck and upper traps, cervical mobs, PA, sideglide and rotational mobs with movement.   ?Treatment on date: 05/13/21 ?Cervical A/ROM 3 ways: gentle in sitting and 1 set in supine (rotation and sidebending) ?Shoulder rolls x10 ?Trigger Point Dry-Needling  ?Treatment instructions: Expect mild to moderate muscle soreness. S/S of pneumothorax if dry needled over a lung field, and to seek immediate medical attention should they occur. Patient verbalized understanding of these instructions and education. ? ?Patient Consent Given: Yes ?Education handout provided: Previously provided ?Muscles treated: cervical multifidi, upper traps ?Treatment response/outcome: twitch response and improved tissue mobility  ?Elongation and trigger point release to neck after needling.  ?Skilled palpation and monitoring by PT during dry needling  ?Treatment 05/08/21 ?Discussion of treatment plan and benefits of DN ?  ?  ?PATIENT EDUCATION:  ?Education details:[id number] ?Person educated: Patient ?Education method: Explanation and Handouts ?Education comprehension:  verbalized understanding ?  ?  ?HOME EXERCISE PROGRAM: ?Access Code: [email or web address] ?URL: [email or web address] ?Date: 05/27/2021 ?Prepared by: Ruben Im ? ?Exercises ?- Seated Cervical Flexion AROM  - 3

## 2021-05-28 ENCOUNTER — Other Ambulatory Visit: Payer: Self-pay | Admitting: Family Medicine

## 2021-05-28 DIAGNOSIS — I10 Essential (primary) hypertension: Secondary | ICD-10-CM

## 2021-05-29 ENCOUNTER — Ambulatory Visit: Payer: Medicare PPO | Admitting: Physical Therapy

## 2021-05-29 DIAGNOSIS — M25512 Pain in left shoulder: Secondary | ICD-10-CM | POA: Diagnosis not present

## 2021-05-29 DIAGNOSIS — M542 Cervicalgia: Secondary | ICD-10-CM | POA: Diagnosis not present

## 2021-05-29 DIAGNOSIS — M6281 Muscle weakness (generalized): Secondary | ICD-10-CM

## 2021-05-29 NOTE — Therapy (Signed)
?OUTPATIENT PHYSICAL THERAPY TREATMENT NOTE ? ? ?Patient Name: [name] [name] ?MRN: [id number] ?DOB:June 28, 1945, 76 y.o., female ?Today's Date: 05/29/2021 ? ?PCP: Caren Macadam, MD ?REFERRING PROVIDER: Caren Macadam, MD ? ?END OF SESSION:  ? PT End of Session - 05/29/21 1236   ? ? Visit Number 6   ? Number of Visits 12   ? Date for PT Re-Evaluation 07/03/21   ? Authorization Type Cohere: 12 visits approved 4/13-5/26   ? Authorization - Visit Number 6   ? Authorization - Number of Visits 12   ? Progress Note Due on Visit 20   ? PT Start Time 1230   ? PT Stop Time 1308   ? PT Time Calculation (min) 38 min   ? Activity Tolerance Patient tolerated treatment well   ? ?  ?  ? ?  ? ? ? ? ?Past Medical History:  ?Diagnosis Date  ? Aorto-iliac atherosclerosis ([place])   ? last aaa duplex 08-30-2015  >50% stenosis bilateral common iliac arteries and left external iliac artery  ? Arrhythmia   ? Carotid stenosis, bilateral   ? cardiologist-- dr Stanford Breed ([place] 08-06-2015)  04-29-2017 followed by pcp/  last duplex 03-08-2017 bilateral ICA 40-59% post endarterectomy,  right ECA occluded  ? Cerebral vascular disease   ? Chronic gastritis   ? Elevated cholesterol   ? Esophageal stricture   ? Full dentures   ? GERD (gastroesophageal reflux disease)   ? Hiatal hernia   ? History of aortic valve disorder   ? per cardiology note dated 08-06-2015 by dr Stanford Breed pt has hx small fibroblastoma aortic valve per echo 2008 /  last echo 08-23-2015 no fibroblastoma noted or stenosis or thickened/ calcification  ? History of gastric ulcer 2013  ? History of nuclear stress test 06-17-2006   per dr Stanford Breed note dated 08-06-2015 , normal study  ? History of transient ischemic attack (TIA) 04/25/2006  ? severe left ICA stenosis/   04-29-2017 per pt no residual  ? HLD (hyperlipidemia)   ? HTN (hypertension)   ? IBS (irritable bowel syndrome)   ? OA (osteoarthritis)   ? Pneumonia   ? Prolapsed internal hemorrhoids, grade 3   ? PVD (peripheral  vascular disease) ([place])   ? Rectal bleed   ? SYMPTOM, ECCHYMOSES, SPONTANEOUS 06/16/2006  ? Qualifier: Diagnosis of  By: Leanne Chang MD, Bruce    ? Vertigo   ? Wears contact lenses   ? ?Past Surgical History:  ?Procedure Laterality Date  ? CAROTID ENDARTERECTOMY Bilateral left 04-29-2006 and right 08-17-2007 by  dr c. Scot Dock  [facility]  ? HEMORRHOID SURGERY N/A 05/07/2017  ? Procedure: ANAL EXAM UNDER ANESTHESIA, HEMORRHOIDECTOMY;  Surgeon: Leighton Ruff, MD;  Location: [facility];  Service: General;  Laterality: N/A;  ? NASAL SINUS SURGERY  age 20  ? TONSILLECTOMY  age 6  ? TRANSTHORACIC ECHOCARDIOGRAM  08/23/2015  ? ef 40-81%, grade 1 diastolic dysfunction/ mild AR and MR  ? VASCULAR SURGERY Bilateral   ? carotids  ? WRIST GANGLION EXCISION Right yrs ago  ? ?Patient Active Problem List  ? Diagnosis Date Noted  ? CAROTID BRUIT 11/21/2008  ? HYPERCHOLESTEROLEMIA 11/16/2008  ? Osteoarthritis 11/16/2008  ? Hyperlipemia 06/16/2006  ? Essential hypertension 06/16/2006  ? Cerebrovascular disease 06/16/2006  ? GERD 06/16/2006  ? ? ?REFERRING DIAG:  ?M25.512 (ICD-10-CM) - Left shoulder pain, unspecified chronicity  ?M54.2 (ICD-10-CM) - Neck pain  ? ? ?THERAPY DIAG:  ?Left shoulder pain, unspecified chronicity ? ?Cervicalgia ? ?  Muscle weakness (generalized) ? ?PERTINENT HISTORY: Goes by [name] Median; mild strokes (takes blood thinner)  ? ?PRECAUTIONS: none ? ?SUBJECTIVE:   I'm really sore from exercises.  Really just my upper arm that hurts.  My sister is under hospice care so I'd like to go sit with her.   ?PAIN:  ?Are you having pain? Yes: NPRS scale: 2/10 ?Pain location: bil shoulders and neck ?Pain description: aching, constant  ?Aggravating factors: it always hurts, movment ?Relieving factors: nothing helps  ? ? ?OBJECTIVE: from evaluation on 05/08/21 ?  ?DIAGNOSTIC FINDINGS:  ?"It's tendonitis"  ?  ?PATIENT SURVEYS:  ?FOTO 46% ? ?COGNITION: ?Overall cognitive status: Within functional limits for tasks assessed ?   ?SENSATION: ?WFL ?  ?POSTURE:  ?WFLs ?  ?PALPATION: ?Tender points in bil upper traps and bil cervical paraspinals     ?  ?CERVICAL ROM:  ?  ?Active ROM A/PROM (deg) ?05/08/2021 A/PROM (deg) ?05/15/21  ?Flexion 35 60  ?Extension 25   ?Right lateral flexion 18 25  ?Left lateral flexion 15 35  ?Right rotation 30 40  ?Left rotation 18 50  ? (Blank rows = not tested) ?  ?UE ROM:  in supine shoulder flexion to 120 degrees limited by pain ?  ?Active ROM Right ?4/13 Left ?4/13 Left  ?05/20/21  ?Shoulder flexion 147 108 130  ?Shoulder extension       ?Shoulder abduction 168 75 94  ?Shoulder adduction       ?Shoulder extension       ?Shoulder internal rotation T10 Left iliac crest   ?Shoulder external rotation C7 Left top of shoulder    ?Elbow flexion       ?Elbow extension       ?Wrist flexion       ?Wrist extension       ?Wrist ulnar deviation       ?Wrist radial deviation       ?Wrist pronation       ?Wrist supination       ? (Blank rows = not tested) ?  ?UE MMT: ?  ?MMT Right ?4/13 Left ?4/13  ?Shoulder flexion 5 3  ?Shoulder extension 5    ?Shoulder abduction 5 3  ?Shoulder adduction      ?Shoulder extension 5    ?Shoulder internal rotation 5 3  ?Shoulder external rotation 5 3  ?Middle trapezius      ?Lower trapezius      ?Elbow flexion      ?Elbow extension      ?Wrist flexion      ?Wrist extension      ?Wrist ulnar deviation      ?Wrist radial deviation      ?Wrist pronation      ?Wrist supination      ?Grip strength      ? (Blank rows = not tested) ?  ?CERVICAL SPECIAL TESTS:  ?Distraction test: Positive ?  ?PATIENT SURVEYS:  ?FOTO 46% ?  ?TODAY'S TREATMENT:  ?05/29/21: ?Arm bike L1 4 min forward and back ?Seated thoracic extension with ball 10x; ?Cervical rotation with towel self mob with movt 10x right/left ?Pink ball to wall isometric 8 sec hold 5x ?Seated pink ball roll rotation 8x on wall ?Red band rows 15x ?Red band bil shoulder extensions 15x ? ? ? ? ? ?05/27/21: ?Cervical rotation with towel self mob with movt 10x  right/left ?Seated thoracic extension with ball 10x; ?Seated 1.5# biceps curls 10x ?Standing 1.5# bil shrugs 10x ?Standing snatch and press overhead  1.5# bil 10x right/left  ?Discussion on results vs. Time progress ? ? ? ?Treatment on date: 05/20/21 ?Cervical A/ROM 3 ways: gentle in sitting  ?Arm bike: level 1x 6 min (3/3)- PT present to discuss progress ?Biceps curls: 2# 2x10 ?  ?Trigger Point Dry-Needling  ?Treatment instructions: Expect mild to moderate muscle soreness. S/S of pneumothorax if dry needled over a lung field, and to seek immediate medical attention should they occur. Patient verbalized understanding of these instructions and education. ? ?Patient Consent Given: Yes ?Education handout provided: Previously provided ?Muscles treated: cervical multifidi, upper traps ?Treatment response/outcome: twitch response and improved tissue mobility  ?Elongation and trigger point release to neck after needling.  ?Skilled palpation and monitoring by PT during dry needling  ?Treatment on date: 05/15/21: ?Cervical A/ROM 3 ways: gentle in sitting and 1 set in supine (rotation and sidebending) ?Shoulder rolls x10 ?  Manual: elontation to bil neck and upper traps, cervical mobs, PA, sideglide and rotational mobs with movement.   ?Treatment on date: 05/13/21 ?Cervical A/ROM 3 ways: gentle in sitting and 1 set in supine (rotation and sidebending) ?Shoulder rolls x10 ?Trigger Point Dry-Needling  ?Treatment instructions: Expect mild to moderate muscle soreness. S/S of pneumothorax if dry needled over a lung field, and to seek immediate medical attention should they occur. Patient verbalized understanding of these instructions and education. ? ?Patient Consent Given: Yes ?Education handout provided: Previously provided ?Muscles treated: cervical multifidi, upper traps ?Treatment response/outcome: twitch response and improved tissue mobility  ?Elongation and trigger point release to neck after needling.  ?Skilled palpation and  monitoring by PT during dry needling  ?Treatment 05/08/21 ?Discussion of treatment plan and benefits of DN ?  ?  ?PATIENT EDUCATION:  ?Education details:[id number] ?Person educated: Patient ?Education method: [employer]ana

## 2021-05-30 NOTE — Telephone Encounter (Signed)
Pt calling back stating she would like to speak with D Koberlein directly regarding her leaving. [phone] or her cell.  ?

## 2021-05-30 NOTE — Telephone Encounter (Signed)
Spoke with the patient for more information as PCP is seeing patients at this time.  Patient stated she has been to a number of visits with the physical therapist Dr Ethlyn Gallery referred her to and she does not feel any better.  Complains of recurrent pain, popping in the neck and inability at times to turn her head to the right.  States she feels OK during the actual PT visit and worse 2 hours later.  Questioned what she should do at this point as [employer] was mentioned during the last visit?  Message sent to PCP. ?

## 2021-05-30 NOTE — Telephone Encounter (Signed)
See prior phone note. 

## 2021-06-03 ENCOUNTER — Ambulatory Visit: Payer: Medicare PPO | Admitting: Physical Therapy

## 2021-06-03 DIAGNOSIS — M6281 Muscle weakness (generalized): Secondary | ICD-10-CM | POA: Diagnosis not present

## 2021-06-03 DIAGNOSIS — M25512 Pain in left shoulder: Secondary | ICD-10-CM

## 2021-06-03 DIAGNOSIS — M542 Cervicalgia: Secondary | ICD-10-CM

## 2021-06-03 NOTE — Therapy (Signed)
?OUTPATIENT PHYSICAL THERAPY TREATMENT NOTE ? ? ?Patient Name: [name] [name] ?MRN: [id number] ?DOB:Nov 24, 1945, 76 y.o., female ?Today's Date: 06/03/2021 ? ?PCP: Caren Macadam, MD ?REFERRING PROVIDER: Caren Macadam, MD ? ?END OF SESSION:  ? PT End of Session - 06/03/21 1346   ? ? Visit Number 7   ? Number of Visits 12   ? Date for PT Re-Evaluation 07/03/21   ? Authorization Type Cohere: 12 visits approved 4/13-5/26   ? Authorization - Visit Number 7   ? Authorization - Number of Visits 12   ? Progress Note Due on Visit 10   ? PT Start Time 1400   ? PT Stop Time 1440   ? PT Time Calculation (min) 40 min   ? Activity Tolerance Patient tolerated treatment well   ? ?  ?  ? ?  ? ? ? ? ?Past Medical History:  ?Diagnosis Date  ? Aorto-iliac atherosclerosis ([place])   ? last aaa duplex 08-30-2015  >50% stenosis bilateral common iliac arteries and left external iliac artery  ? Arrhythmia   ? Carotid stenosis, bilateral   ? cardiologist-- dr Stanford Breed ([place] 08-06-2015)  04-29-2017 followed by pcp/  last duplex 03-08-2017 bilateral ICA 40-59% post endarterectomy,  right ECA occluded  ? Cerebral vascular disease   ? Chronic gastritis   ? Elevated cholesterol   ? Esophageal stricture   ? Full dentures   ? GERD (gastroesophageal reflux disease)   ? Hiatal hernia   ? History of aortic valve disorder   ? per cardiology note dated 08-06-2015 by dr Stanford Breed pt has hx small fibroblastoma aortic valve per echo 2008 /  last echo 08-23-2015 no fibroblastoma noted or stenosis or thickened/ calcification  ? History of gastric ulcer 2013  ? History of nuclear stress test 06-17-2006   per dr Stanford Breed note dated 08-06-2015 , normal study  ? History of transient ischemic attack (TIA) 04/25/2006  ? severe left ICA stenosis/   04-29-2017 per pt no residual  ? HLD (hyperlipidemia)   ? HTN (hypertension)   ? IBS (irritable bowel syndrome)   ? OA (osteoarthritis)   ? Pneumonia   ? Prolapsed internal hemorrhoids, grade 3   ? PVD (peripheral  vascular disease) ([place])   ? Rectal bleed   ? SYMPTOM, ECCHYMOSES, SPONTANEOUS 06/16/2006  ? Qualifier: Diagnosis of  By: Leanne Chang MD, Bruce    ? Vertigo   ? Wears contact lenses   ? ?Past Surgical History:  ?Procedure Laterality Date  ? CAROTID ENDARTERECTOMY Bilateral left 04-29-2006 and right 08-17-2007 by  dr c. Scot Dock  [facility]  ? HEMORRHOID SURGERY N/A 05/07/2017  ? Procedure: ANAL EXAM UNDER ANESTHESIA, HEMORRHOIDECTOMY;  Surgeon: Leighton Ruff, MD;  Location: [facility];  Service: General;  Laterality: N/A;  ? NASAL SINUS SURGERY  age 65  ? TONSILLECTOMY  age 65  ? TRANSTHORACIC ECHOCARDIOGRAM  08/23/2015  ? ef 96-28%, grade 1 diastolic dysfunction/ mild AR and MR  ? VASCULAR SURGERY Bilateral   ? carotids  ? WRIST GANGLION EXCISION Right yrs ago  ? ?Patient Active Problem List  ? Diagnosis Date Noted  ? CAROTID BRUIT 11/21/2008  ? HYPERCHOLESTEROLEMIA 11/16/2008  ? Osteoarthritis 11/16/2008  ? Hyperlipemia 06/16/2006  ? Essential hypertension 06/16/2006  ? Cerebrovascular disease 06/16/2006  ? GERD 06/16/2006  ? ? ?REFERRING DIAG:  ?M25.512 (ICD-10-CM) - Left shoulder pain, unspecified chronicity  ?M54.2 (ICD-10-CM) - Neck pain  ? ? ?THERAPY DIAG:  ?Left shoulder pain, unspecified chronicity ? ?Cervicalgia ? ?  Muscle weakness (generalized) ? ?PERTINENT HISTORY: Goes by [name] Median; mild strokes (takes blood thinner)  ? ?PRECAUTIONS: none ? ?SUBJECTIVE:  What really gets me is looking down to read for a while.   I do the exercises better when you're with me.   ?PAIN:  ?Are you having pain? Yes: NPRS scale: 4/10 ?Pain location: bil shoulders and neck ?Pain description: aching, constant  ?Aggravating factors: it always hurts, movment ?Relieving factors: nothing helps  ? ? ?OBJECTIVE: from evaluation on 05/08/21 ?  ?DIAGNOSTIC FINDINGS:  ?"It's tendonitis"  ?  ?PATIENT SURVEYS:  ?FOTO 46% ? ?COGNITION: ?Overall cognitive status: Within functional limits for tasks assessed ?  ?SENSATION: ?WFL ?  ?POSTURE:   ?WFLs ?  ?PALPATION: ?Tender points in bil upper traps and bil cervical paraspinals     ?  ?CERVICAL ROM:  ?  ?Active ROM A/PROM (deg) ?05/08/2021 A/PROM (deg) ?05/15/21 5/9  ?Flexion 35 60 60  ?Extension 25  30  ?Right lateral flexion '18 25 21  '$ ?Left lateral flexion 15 35 22  ?Right rotation 30 40 42  ?Left rotation 18 50 22  ? (Blank rows = not tested) ?  ?UE ROM:  in supine shoulder flexion to 120 degrees limited by pain ?  ?Active ROM Right ?4/13 Left ?4/13 Left  ?05/20/21 5/9  ?Shoulder flexion 147 108 130 140  ?Shoulder extension        ?Shoulder abduction 168 75 94 152  ?Shoulder adduction        ?Shoulder extension        ?Shoulder internal rotation T10 Left iliac crest    ?Shoulder external rotation C7 Left top of shoulder     ?Elbow flexion        ?Elbow extension        ?Wrist flexion        ?Wrist extension        ?Wrist ulnar deviation        ?Wrist radial deviation        ?Wrist pronation        ?Wrist supination        ? (Blank rows = not tested) ?  ?UE MMT: ?  ?MMT Right ?4/13 Left ?4/13 Left 5/9  ?Shoulder flexion 5 3 3+  ?Shoulder extension 5     ?Shoulder abduction 5 3 3+  ?Shoulder adduction       ?Shoulder extension 5     ?Shoulder internal rotation 5 3 3+  ?Shoulder external rotation 5 3 3+  ?Middle trapezius       ?Lower trapezius       ?Elbow flexion       ?Elbow extension       ?Wrist flexion       ?Wrist extension       ?Wrist ulnar deviation       ?Wrist radial deviation       ?Wrist pronation       ?Wrist supination       ?Grip strength       ? (Blank rows = not tested) ?  ?CERVICAL SPECIAL TESTS:  ?Distraction test: Positive ?  ?PATIENT SURVEYS:  ?FOTO 46% ?  ?TODAY'S TREATMENT:  ?06/03/21: ?Arm bike L1 5 min forward ?Seated thoracic extension with ball 10x; ?Cervical rotation with towel self mob with movt 10x right/left ?Cervical extension with towel support 10x; ? ?Manual:manual cervical distraction; elontation to bil neck and upper traps, cervical mobs, PA, sideglide and rotational mobs  with movement.  ?Discussion of  over the door traction device for home ? ?05/29/21: ?Arm bike L1 4 min forward and back ?Seated thoracic extension with ball 10x; ?Cervical rotation with towel self mob with movt 10x right/left ?Pink ball to wall isometric 8 sec hold 5x ?Seated pink ball roll rotation 8x on wall ?Red band rows 15x ?Red band bil shoulder extensions 15x ? ? ? ? ? ?05/27/21: ?Cervical rotation with towel self mob with movt 10x right/left ?Seated thoracic extension with ball 10x; ?Seated 1.5# biceps curls 10x ?Standing 1.5# bil shrugs 10x ?Standing snatch and press overhead 1.5# bil 10x right/left  ?Discussion on results vs. Time progress ? ? ? ?Treatment on date: 05/20/21 ?Cervical A/ROM 3 ways: gentle in sitting  ?Arm bike: level 1x 6 min (3/3)- PT present to discuss progress ?Biceps curls: 2# 2x10 ?  ?Trigger Point Dry-Needling  ?Treatment instructions: Expect mild to moderate muscle soreness. S/S of pneumothorax if dry needled over a lung field, and to seek immediate medical attention should they occur. Patient verbalized understanding of these instructions and education. ? ?Patient Consent Given: Yes ?Education handout provided: Previously provided ?Muscles treated: cervical multifidi, upper traps ?Treatment response/outcome: twitch response and improved tissue mobility  ?Elongation and trigger point release to neck after needling.  ?Skilled palpation and monitoring by PT during dry needling  ?Treatment on date: 05/15/21: ?Cervical A/ROM 3 ways: gentle in sitting and 1 set in supine (rotation and sidebending) ?Shoulder rolls x10 ?  Manual: elontation to bil neck and upper traps, cervical mobs, PA, sideglide and rotational mobs with movement.   ?Treatment on date: 05/13/21 ?Cervical A/ROM 3 ways: gentle in sitting and 1 set in supine (rotation and sidebending) ?Shoulder rolls x10 ?Trigger Point Dry-Needling  ?Treatment instructions: Expect mild to moderate muscle soreness. S/S of pneumothorax if dry needled  over a lung field, and to seek immediate medical attention should they occur. Patient verbalized understanding of these instructions and education. ? ?Patient Consent Given: Yes ?Education handout provided: [employer]

## 2021-06-04 ENCOUNTER — Other Ambulatory Visit: Payer: Self-pay | Admitting: Family Medicine

## 2021-06-04 DIAGNOSIS — M542 Cervicalgia: Secondary | ICD-10-CM

## 2021-06-04 MED ORDER — TIZANIDINE HCL 4 MG PO TABS: 2.0000 mg | ORAL_TABLET | Freq: Two times a day (BID) | ORAL | 2 refills | Status: DC | PRN

## 2021-06-04 NOTE — Telephone Encounter (Signed)
I was able to speak with patient today. All concerns/questions answered.  ?

## 2021-06-05 ENCOUNTER — Encounter: Payer: Medicare PPO | Admitting: Physical Therapy

## 2021-06-06 ENCOUNTER — Other Ambulatory Visit: Payer: Self-pay | Admitting: Family Medicine

## 2021-06-06 DIAGNOSIS — K219 Gastro-esophageal reflux disease without esophagitis: Secondary | ICD-10-CM

## 2021-06-10 ENCOUNTER — Ambulatory Visit: Payer: Medicare PPO | Admitting: Physical Therapy

## 2021-06-10 DIAGNOSIS — M25512 Pain in left shoulder: Secondary | ICD-10-CM | POA: Diagnosis not present

## 2021-06-10 DIAGNOSIS — M542 Cervicalgia: Secondary | ICD-10-CM

## 2021-06-10 DIAGNOSIS — M6281 Muscle weakness (generalized): Secondary | ICD-10-CM | POA: Diagnosis not present

## 2021-06-10 NOTE — Therapy (Signed)
?OUTPATIENT PHYSICAL THERAPY TREATMENT NOTE ? ? ?Patient Name: [name] [name] ?MRN: [id number] ?DOB:Apr 04, 1945, 76 y.o., female ?Today's Date: 06/10/2021 ? ?PCP: Caren Macadam, MD ?REFERRING PROVIDER: Caren Macadam, MD ? ?END OF SESSION:  ? PT End of Session - 06/10/21 1234   ? ? Visit Number 8   ? Number of Visits 12   ? Date for PT Re-Evaluation 07/03/21   ? Authorization Type Cohere: 12 visits approved 4/13-5/26   ? Authorization - Visit Number 8   ? Authorization - Number of Visits 12   ? PT Start Time 1230   ? PT Stop Time 1300   pt requests shorter session  ? PT Time Calculation (min) 30 min   ? Activity Tolerance Patient tolerated treatment well   ? ?  ?  ? ?  ? ? ? ? ?Past Medical History:  ?Diagnosis Date  ? Aorto-iliac atherosclerosis ([place])   ? last aaa duplex 08-30-2015  >50% stenosis bilateral common iliac arteries and left external iliac artery  ? Arrhythmia   ? Carotid stenosis, bilateral   ? cardiologist-- dr Stanford Breed ( 08-06-2015)  04-29-2017 followed by pcp/  last duplex 03-08-2017 bilateral ICA 40-59% post endarterectomy,  right ECA occluded  ? Cerebral vascular disease   ? Chronic gastritis   ? Elevated cholesterol   ? Esophageal stricture   ? Full dentures   ? GERD (gastroesophageal reflux disease)   ? Hiatal hernia   ? History of aortic valve disorder   ? per cardiology note dated 08-06-2015 by dr Stanford Breed pt has hx small fibroblastoma aortic valve per echo 2008 /  last echo 08-23-2015 no fibroblastoma noted or stenosis or thickened/ calcification  ? History of gastric ulcer 2013  ? History of nuclear stress test 06-17-2006   per dr Stanford Breed note dated 08-06-2015 , normal study  ? History of transient ischemic attack (TIA) 04/25/2006  ? severe left ICA stenosis/   04-29-2017 per pt no residual  ? HLD (hyperlipidemia)   ? HTN (hypertension)   ? IBS (irritable bowel syndrome)   ? OA (osteoarthritis)   ? Pneumonia   ? Prolapsed internal hemorrhoids, grade 3   ? PVD (peripheral vascular  disease) ([place])   ? Rectal bleed   ? SYMPTOM, ECCHYMOSES, SPONTANEOUS 06/16/2006  ? Qualifier: Diagnosis of  By: Leanne Chang MD, Bruce    ? Vertigo   ? Wears contact lenses   ? ?Past Surgical History:  ?Procedure Laterality Date  ? CAROTID ENDARTERECTOMY Bilateral left 04-29-2006 and right 08-17-2007 by  dr c. Scot Dock  [facility]  ? HEMORRHOID SURGERY N/A 05/07/2017  ? Procedure: ANAL EXAM UNDER ANESTHESIA, HEMORRHOIDECTOMY;  Surgeon: Leighton Ruff, MD;  Location: [facility];  Service: General;  Laterality: N/A;  ? NASAL SINUS SURGERY  age 6  ? TONSILLECTOMY  age 74  ? TRANSTHORACIC ECHOCARDIOGRAM  08/23/2015  ? ef 45-62%, grade 1 diastolic dysfunction/ mild AR and MR  ? VASCULAR SURGERY Bilateral   ? carotids  ? WRIST GANGLION EXCISION Right yrs ago  ? ?Patient Active Problem List  ? Diagnosis Date Noted  ? CAROTID BRUIT 11/21/2008  ? HYPERCHOLESTEROLEMIA 11/16/2008  ? Osteoarthritis 11/16/2008  ? Hyperlipemia 06/16/2006  ? Essential hypertension 06/16/2006  ? Cerebrovascular disease 06/16/2006  ? GERD 06/16/2006  ? ? ?REFERRING DIAG:  ?M25.512 (ICD-10-CM) - Left shoulder pain, unspecified chronicity  ?M54.2 (ICD-10-CM) - Neck pain  ? ? ?THERAPY DIAG:  ?Left shoulder pain, unspecified chronicity ? ?Cervicalgia ? ?Muscle weakness (generalized) ? ?  PERTINENT HISTORY: Goes by [name] Median; mild strokes (takes blood thinner)  ? ?PRECAUTIONS: none ? ?SUBJECTIVE:  I've got a headache on the back of the head.  I don't really feel up to being her today.  I think the weights help just as much as the manual treatment.   ?Are you having pain? Yes posterior head 6/10 ? ? ?OBJECTIVE: from evaluation on 05/08/21 ?  ?DIAGNOSTIC FINDINGS:  ?"It's tendonitis"  ?  ?PATIENT SURVEYS:  ?FOTO 46% ? ?COGNITION: ?Overall cognitive status: Within functional limits for tasks assessed ?  ?SENSATION: ?WFL ?  ?POSTURE:  ?WFLs ?  ?PALPATION: ?Tender points in bil upper traps and bil cervical paraspinals     ?  ?CERVICAL ROM:  ?  ?Active ROM A/PROM  (deg) ?05/08/2021 A/PROM (deg) ?05/15/21 5/9  ?Flexion 35 60 60  ?Extension 25  30  ?Right lateral flexion '18 25 21  '$ ?Left lateral flexion 15 35 22  ?Right rotation 30 40 42  ?Left rotation 18 50 22  ? (Blank rows = not tested) ?  ?UE ROM:  in supine shoulder flexion to 120 degrees limited by pain ?  ?Active ROM Right ?4/13 Left ?4/13 Left  ?05/20/21 5/9  ?Shoulder flexion 147 108 130 140  ?Shoulder extension        ?Shoulder abduction 168 75 94 152  ?Shoulder adduction        ?Shoulder extension        ?Shoulder internal rotation T10 Left iliac crest    ?Shoulder external rotation C7 Left top of shoulder     ?Elbow flexion        ?Elbow extension        ?Wrist flexion        ?Wrist extension        ?Wrist ulnar deviation        ?Wrist radial deviation        ?Wrist pronation        ?Wrist supination        ? (Blank rows = not tested) ?  ?UE MMT: ?  ?MMT Right ?4/13 Left ?4/13 Left 5/9  ?Shoulder flexion 5 3 3+  ?Shoulder extension 5     ?Shoulder abduction 5 3 3+  ?Shoulder adduction       ?Shoulder extension 5     ?Shoulder internal rotation 5 3 3+  ?Shoulder external rotation 5 3 3+  ?Middle trapezius       ?Lower trapezius       ?Elbow flexion       ?Elbow extension       ?Wrist flexion       ?Wrist extension       ?Wrist ulnar deviation       ?Wrist radial deviation       ?Wrist pronation       ?Wrist supination       ?Grip strength       ? (Blank rows = not tested) ?  ?CERVICAL SPECIAL TESTS:  ?Distraction test: Positive ?  ?PATIENT SURVEYS:  ?FOTO 46% ?  ?TODAY'S TREATMENT:  ?5/16: ?Arm bike L1 6 min forward ?Seated thoracic extension with ball 10x; ?2# bicep curls with overhead press 10x; ?2# horizontal abduction 10x ?2# lateral raises 5x ?Green band rows 15x (pt given green band with handles for home) ?Green band bil shoulder extensions 15x ? ? ? ? ? ? ?06/03/21: ?Arm bike L1 5 min forward ?Seated thoracic extension with ball 10x; ?Cervical rotation with towel self mob  with movt 10x right/left ?Cervical  extension with towel support 10x; ? ?Manual:manual cervical distraction; elontation to bil neck and upper traps, cervical mobs, PA, sideglide and rotational mobs with movement.  ?Discussion of over the door traction device for home ? ?05/29/21: ?Arm bike L1 4 min forward and back ?Seated thoracic extension with ball 10x; ?Cervical rotation with towel self mob with movt 10x right/left ?Pink ball to wall isometric 8 sec hold 5x ?Seated pink ball roll rotation 8x on wall ?Red band rows 15x ?Red band bil shoulder extensions 15x ? ? ? ? ? ?05/27/21: ?Cervical rotation with towel self mob with movt 10x right/left ?Seated thoracic extension with ball 10x; ?Seated 1.5# biceps curls 10x ?Standing 1.5# bil shrugs 10x ?Standing snatch and press overhead 1.5# bil 10x right/left  ?Discussion on results vs. Time progress ? ? ? ?Treatment on date: 05/20/21 ?Cervical A/ROM 3 ways: gentle in sitting  ?Arm bike: level 1x 6 min (3/3)- PT present to discuss progress ?Biceps curls: 2# 2x10 ?  ?Trigger Point Dry-Needling  ?Treatment instructions: Expect mild to moderate muscle soreness. S/S of pneumothorax if dry needled over a lung field, and to seek immediate medical attention should they occur. Patient verbalized understanding of these instructions and education. ? ?Patient Consent Given: Yes ?Education handout provided: Previously provided ?Muscles treated: cervical multifidi, upper traps ?Treatment response/outcome: twitch response and improved tissue mobility  ?Elongation and trigger point release to neck after needling.  ?Skilled palpation and monitoring by PT during dry needling  ?Treatment on date: 05/15/21: ?Cervical A/ROM 3 ways: gentle in sitting and 1 set in supine (rotation and sidebending) ?Shoulder rolls x10 ?  Manual: elontation to bil neck and upper traps, cervical mobs, PA, sideglide and rotational mobs with movement.   ?Treatment on date: 05/13/21 ?Cervical A/ROM 3 ways: gentle in sitting and 1 set in supine (rotation and  sidebending) ?Shoulder rolls x10 ?Trigger Point Dry-Needling  ?Treatment instructions: Expect mild to moderate muscle soreness. S/S of pneumothorax if dry needled over a lung field, and to seek immediate medical

## 2021-06-12 ENCOUNTER — Ambulatory Visit: Payer: Medicare PPO | Admitting: Physical Therapy

## 2021-06-12 DIAGNOSIS — M25512 Pain in left shoulder: Secondary | ICD-10-CM

## 2021-06-12 DIAGNOSIS — M6281 Muscle weakness (generalized): Secondary | ICD-10-CM

## 2021-06-12 DIAGNOSIS — M542 Cervicalgia: Secondary | ICD-10-CM | POA: Diagnosis not present

## 2021-06-12 NOTE — Therapy (Signed)
OUTPATIENT PHYSICAL THERAPY TREATMENT NOTE   Patient Name: [name] [name] MRN: [id number] DOB:12-Feb-1945, 76 y.o., female Today's Date: 06/12/2021  PCP: Caren Macadam, MD REFERRING PROVIDER: Caren Macadam, MD  END OF SESSION:   PT End of Session - 06/12/21 1402     Visit Number 9    Number of Visits 12    Date for PT Re-Evaluation 07/03/21    Authorization Type Cohere: 12 visits approved 4/13-5/26    Authorization - Visit Number 9    Authorization - Number of Visits 12    Progress Note Due on Visit 10    PT Start Time 1400    PT Stop Time 1093    PT Time Calculation (min) 38 min    Activity Tolerance Patient tolerated treatment well               Past Medical History:  Diagnosis Date   Aorto-iliac atherosclerosis ([place])    last aaa duplex 08-30-2015  >50% stenosis bilateral common iliac arteries and left external iliac artery   Arrhythmia    Carotid stenosis, bilateral    cardiologist-- dr Stanford Breed ([place] 08-06-2015)  04-29-2017 followed by pcp/  last duplex 03-08-2017 bilateral ICA 40-59% post endarterectomy,  right ECA occluded   Cerebral vascular disease    Chronic gastritis    Elevated cholesterol    Esophageal stricture    Full dentures    GERD (gastroesophageal reflux disease)    Hiatal hernia    History of aortic valve disorder    per cardiology note dated 08-06-2015 by dr Stanford Breed pt has hx small fibroblastoma aortic valve per echo 2008 /  last echo 08-23-2015 no fibroblastoma noted or stenosis or thickened/ calcification   History of gastric ulcer 2013   History of nuclear stress test 06-17-2006   per dr Stanford Breed note dated 08-06-2015 , normal study   History of transient ischemic attack (TIA) 04/25/2006   severe left ICA stenosis/   04-29-2017 per pt no residual   HLD (hyperlipidemia)    HTN (hypertension)    IBS (irritable bowel syndrome)    OA (osteoarthritis)    Pneumonia    Prolapsed internal hemorrhoids, grade 3    PVD (peripheral  vascular disease) ([place])    Rectal bleed    SYMPTOM, ECCHYMOSES, SPONTANEOUS 06/16/2006   Qualifier: Diagnosis of  By: Leanne Chang MD, Bruce     Vertigo    Wears contact lenses    Past Surgical History:  Procedure Laterality Date   CAROTID ENDARTERECTOMY Bilateral left 04-29-2006 and right 08-17-2007 by  dr c. Scot Dock  [facility]   HEMORRHOID SURGERY N/A 05/07/2017   Procedure: ANAL EXAM UNDER ANESTHESIA, HEMORRHOIDECTOMY;  Surgeon: Leighton Ruff, MD;  Location: [facility];  Service: General;  Laterality: N/A;   NASAL SINUS SURGERY  age 41   TONSILLECTOMY  age 6   TRANSTHORACIC ECHOCARDIOGRAM  08/23/2015   ef 23-55%, grade 1 diastolic dysfunction/ mild AR and MR   VASCULAR SURGERY Bilateral    carotids   WRIST GANGLION EXCISION Right yrs ago   Patient Active Problem List   Diagnosis Date Noted   CAROTID BRUIT 11/21/2008   HYPERCHOLESTEROLEMIA 11/16/2008   Osteoarthritis 11/16/2008   Hyperlipemia 06/16/2006   Essential hypertension 06/16/2006   Cerebrovascular disease 06/16/2006   GERD 06/16/2006    REFERRING DIAG:  M25.512 (ICD-10-CM) - Left shoulder pain, unspecified chronicity  M54.2 (ICD-10-CM) - Neck pain    THERAPY DIAG:  Left shoulder pain, unspecified chronicity  Cervicalgia  Muscle weakness (generalized)  PERTINENT HISTORY: Goes by [name] Median; mild strokes (takes blood thinner)   PRECAUTIONS: none  SUBJECTIVE:  I ordered that traction device and got this thing from [place] store to try too.  Did fine after last visit.  No headache today.  I was able to reach back with my left hand to grab the seatbelt.  I haven't done that in a long time.   Are you having pain? Yes 4/10   OBJECTIVE: from evaluation on 05/08/21   DIAGNOSTIC FINDINGS:  "It's tendonitis"    PATIENT SURVEYS:  FOTO 46%  COGNITION: Overall cognitive status: Within functional limits for tasks assessed   SENSATION: WFL   POSTURE:  WFLs   PALPATION: Tender points in bil upper traps and  bil cervical paraspinals       CERVICAL ROM:    Active ROM A/PROM (deg) 05/08/2021 A/PROM (deg) 05/15/21 5/9   Flexion 35 60 60   Extension 25  30   Right lateral flexion $RemoveBeforeD'18 25 21   'OujxrdjozuyyAA$ Left lateral flexion 15 35 22   Right rotation 30 40 42   Left rotation 18 50 22    (Blank rows = not tested)     Active ROM Right 4/13 Left 4/13 Left  05/20/21 5/9 5/18  Shoulder flexion 147 108 130 140 151 left   Shoulder extension         Shoulder abduction 168 75 94 152   Shoulder adduction         Shoulder extension         Shoulder internal rotation T10 Left iliac crest     Shoulder external rotation C7 Left top of shoulder      Elbow flexion         Elbow extension         Wrist flexion         Wrist extension         Wrist ulnar deviation         Wrist radial deviation         Wrist pronation         Wrist supination          (Blank rows = not tested)   UE MMT:   MMT Right 4/13 Left 4/13 Left 5/9 Left 5/18  Shoulder flexion 5 3 3+ 4  Shoulder extension 5      Shoulder abduction 5 3 3+ 4  Shoulder adduction        Shoulder extension 5      Shoulder internal rotation 5 3 3+ 4  Shoulder external rotation 5 3 3+ 4  Middle trapezius        Lower trapezius        Elbow flexion        Elbow extension        Wrist flexion        Wrist extension        Wrist ulnar deviation        Wrist radial deviation        Wrist pronation        Wrist supination        Grip strength         (Blank rows = not tested)   CERVICAL SPECIAL TESTS:  Distraction test: Positive   PATIENT SURVEYS:  FOTO 46%   TODAY'S TREATMENT:  5/18: Arm bike L1 6 min forward Seated thoracic extension with ball 10x; 2# bicep curls with overhead press 10x2; 2#  horizontal abduction 10x 2# shoulder bil flexion/scaption 10x  2# lateral raises 10x Green band rows 15x  Green band bil shoulder extensions 15x; Green band bil forward press 15x; Standing on blue band shrug up and back 15x  Standing blue band  serratus hugs 10x   5/16: Arm bike L1 6 min forward Seated thoracic extension with ball 10x; 2# bicep curls with overhead press 10x; 2# horizontal abduction 10x 2# lateral raises 5x Green band rows 15x (pt given green band with handles for home) Green band bil shoulder extensions 15x       06/03/21: Arm bike L1 5 min forward Seated thoracic extension with ball 10x; Cervical rotation with towel self mob with movt 10x right/left Cervical extension with towel support 10x;  Manual:manual cervical distraction; elontation to bil neck and upper traps, cervical mobs, PA, sideglide and rotational mobs with movement.  Discussion of over the door traction device for home  05/29/21: Arm bike L1 4 min forward and back Seated thoracic extension with ball 10x; Cervical rotation with towel self mob with movt 10x right/left Pink ball to wall isometric 8 sec hold 5x Seated pink ball roll rotation 8x on wall Red band rows 15x Red band bil shoulder extensions 15x      05/27/21: Cervical rotation with towel self mob with movt 10x right/left Seated thoracic extension with ball 10x; Seated 1.5# biceps curls 10x Standing 1.5# bil shrugs 10x Standing snatch and press overhead 1.5# bil 10x right/left  Discussion on results vs. Time progress    Treatment on date: 05/20/21 Cervical A/ROM 3 ways: gentle in sitting  Arm bike: level 1x 6 min (3/3)- PT present to discuss progress Biceps curls: 2# 2x10   Trigger Point Dry-Needling  Treatment instructions: Expect mild to moderate muscle soreness. S/S of pneumothorax if dry needled over a lung field, and to seek immediate medical attention should they occur. Patient verbalized understanding of these instructions and education.  Patient Consent Given: Yes Education handout provided: Previously provided Muscles treated: cervical multifidi, upper traps Treatment response/outcome: twitch response and improved tissue mobility  Elongation and trigger  point release to neck after needling.  Skilled palpation and monitoring by PT during dry needling  Treatment on date: 05/15/21: Cervical A/ROM 3 ways: gentle in sitting and 1 set in supine (rotation and sidebending) Shoulder rolls x10   Manual: elontation to bil neck and upper traps, cervical mobs, PA, sideglide and rotational mobs with movement.   Treatment on date: 05/13/21 Cervical A/ROM 3 ways: gentle in sitting and 1 set in supine (rotation and sidebending) Shoulder rolls x10 Trigger Point Dry-Needling  Treatment instructions: Expect mild to moderate muscle soreness. S/S of pneumothorax if dry needled over a lung field, and to seek immediate medical attention should they occur. Patient verbalized understanding of these instructions and education.  Patient Consent Given: Yes Education handout provided: Previously provided Muscles treated: cervical multifidi, upper traps Treatment response/outcome: twitch response and improved tissue mobility  Elongation and trigger point release to neck after needling.  Skilled palpation and monitoring by PT during dry needling  Treatment 05/08/21 Discussion of treatment plan and benefits of DN     PATIENT EDUCATION:  Education details:[email or web address] Person educated: Patient Education method: Theatre stage manager Education comprehension: verbalized understanding     HOME EXERCISE PROGRAM: Access Code: [email or web address] URL: [email or web address] Date: 06/12/2021 Prepared by: Ruben Im  Exercises - Seated Cervical Flexion AROM  - 3 x daily - 7 x weekly - 1 sets - 3 reps -  20 hold - Seated Cervical Sidebending AROM  - 3 x daily - 7 x weekly - 1 sets - 3 reps - 20 hold - Seated Cervical Rotation AROM  - 3 x daily - 7 x weekly - 1 sets - 3 reps - 20 hold - Seated Correct Posture  - 1 x daily - 7 x weekly - 3 sets - 10 reps - Seated Shoulder Rolls  - 5 x daily - 7 x weekly - 1 sets - 10 reps - Seated Assisted Cervical Rotation with Towel   - 1 x daily - 7 x weekly - 1 sets - 10 reps - Seated Thoracic Lumbar Extension with Pectoralis Stretch  - 1 x daily - 7 x weekly - 1 sets - 10 reps - Seated Alternating Bicep Curls with Rotation and Dumbbells  - 1 x daily - 7 x weekly - 1 sets - 10 reps - Standing Shoulder Shrugs with Dumbbells  - 1 x daily - 7 x weekly - 1 sets - 10 reps - Seated Single Arm Shoulder Press with Dumbbell  - 1 x daily - 7 x weekly - 1 sets - 10 reps - Standing Row with Anchored Resistance  - 1 x daily - 7 x weekly - 1 sets - 10 reps - Shoulder extension with resistance - Neutral  - 1 x daily - 7 x weekly - 1 sets - 10 reps - Seated Bilateral Shoulder Scaption with Dumbbells  - 1 x daily - 7 x weekly - 1 sets - 10 reps - Standing Shoulder Shrug and Retraction with Resistance  - 1 x daily - 7 x weekly - 1 sets - 10 reps - Dynamic Hug with Resistance  - 1 x daily - 7 x weekly - 1 sets - 10 reps ASSESSMENT:   CLINICAL IMPRESSION:    Improved functional mobility per patient reports of improved ability to reach the seatbelt and with overhead activities.  Shoulder flexion significantly improved since start of care.  Good response to seated and standing resistive ex's.  Recommend thumb up with shoulder ex.   OBJECTIVE IMPAIRMENTS decreased ROM, decreased strength, hypomobility, impaired perceived functional ability, impaired UE functional use, and pain.    ACTIVITY LIMITATIONS cleaning, driving, meal prep, occupation, and laundry.    PERSONAL FACTORS Time since onset of injury/illness/exacerbation are also affecting patient's functional outcome.      REHAB POTENTIAL: Good   CLINICAL DECISION MAKING: Stable/uncomplicated   EVALUATION COMPLEXITY: Low     GOALS: Goals reviewed with patient? Yes   SHORT TERM GOALS: Target date: 06/05/2021   The patient will demonstrate knowledge of basic self care strategies and exercises to promote healing   Baseline:  Goal status: INITIAL   2.  The patient will have improved  shoulder elevation ROM to at least 120 degrees needed for grooming/dressing purposes as well as reaching high shelves  Baseline: 130 degrees (05/20/21) Goal status: MET   3.  Cervical rotation and sidebending ROM improved to 30 degrees needed for driving Baseline: 24-58 degrees Goal status: In progress   4.  The patient will report a 40% improvement in pain levels with functional activities which are currently difficult including turning her head when speaking to someone at church or when volunteering at Hospice Baseline: 50-60% (05/20/21) Goal status: MET     LONG TERM GOALS: Target date: 07/03/2021   The patient will be independent in a safe self progression of a home exercise program to promote further recovery of function  Baseline:  Goal status: INITIAL   2.  The patient will report a 75% improvement in pain levels with functional activities which are currently difficult including turning her head and reaching overhead Baseline:  Goal status: INITIAL   3.  The patient will have improved shoulder elevation ROM to at least 150 degrees needed for grooming/dressing purposes as well as reaching high shelves  Baseline:  Goal status: INITIAL   4.  The patient will have grossly 4+/5 strength needed to lift and object from a high shelf  Baseline:  Goal status: INITIAL   5.  Cervical ROM improved to 45 degrees rotation and 40 degrees extension needed for driving Baseline:  Goal status: INITIAL 6. FOTO score improved from 46% to 60% indicating improved function with less pain             Goal status: INITIAL     PLAN: PT FREQUENCY: 2x/week   PT DURATION: 8 weeks   PLANNED INTERVENTIONS: Therapeutic exercises, Therapeutic activity, Neuromuscular re-education, Balance training, Gait training, Patient/Family education, Joint mobilization, Aquatic Therapy, Dry Needling, Electrical stimulation, Cryotherapy, Moist heat, Taping, Traction, Ultrasound, Ionotophoresis 4mg /ml Dexamethasone,  and Manual therapy   PLAN FOR NEXT SESSION:   green band ex's; UBE; rotation with towel; thoracic extension, 1.5# wt ex's;  manual;  postural strength to HEP;  DN as needed  Ruben Im, PT 06/12/21 2:42 PM Phone: [phone] Fax: [phone]    [facility] [address], [address] [place], [place] [address] Phone # [phone] Fax [phone]

## 2021-06-17 ENCOUNTER — Ambulatory Visit: Payer: Medicare PPO | Admitting: Physical Therapy

## 2021-06-17 DIAGNOSIS — M25512 Pain in left shoulder: Secondary | ICD-10-CM | POA: Diagnosis not present

## 2021-06-17 DIAGNOSIS — M6281 Muscle weakness (generalized): Secondary | ICD-10-CM | POA: Diagnosis not present

## 2021-06-17 DIAGNOSIS — M542 Cervicalgia: Secondary | ICD-10-CM

## 2021-06-17 NOTE — Therapy (Signed)
OUTPATIENT PHYSICAL THERAPY TREATMENT NOTE/DISCHARGE SUMMARY    Patient Name: [name] [name] MRN: [id number] DOB:05-13-1945, 76 y.o., female Today's Date: 06/17/2021  PCP: Caren Macadam, MD REFERRING PROVIDER: Caren Macadam, MD  Progress Note Reporting Period 4/13 to 06/17/21  See note below for Objective Data and Assessment of Progress/Goals.      END OF SESSION:   PT End of Session - 06/17/21 1403     Visit Number 10    Number of Visits 12    Date for PT Re-Evaluation 07/03/21    Authorization Type [employer]: 12 visits approved 4/13-5/26    Authorization - Visit Number 10    Authorization - Number of Visits 12    Progress Note Due on Visit 10    PT Start Time 1400    PT Stop Time 1440    PT Time Calculation (min) 40 min    Activity Tolerance Patient tolerated treatment well               Past Medical History:  Diagnosis Date   Aorto-iliac atherosclerosis ([place])    last aaa duplex 08-30-2015  >50% stenosis bilateral common iliac arteries and left external iliac artery   Arrhythmia    Carotid stenosis, bilateral    cardiologist-- dr Stanford Breed ([place] 08-06-2015)  04-29-2017 followed by pcp/  last duplex 03-08-2017 bilateral ICA 40-59% post endarterectomy,  right ECA occluded   Cerebral vascular disease    Chronic gastritis    Elevated cholesterol    Esophageal stricture    Full dentures    GERD (gastroesophageal reflux disease)    Hiatal hernia    History of aortic valve disorder    per cardiology note dated 08-06-2015 by dr Stanford Breed pt has hx small fibroblastoma aortic valve per echo 2008 /  last echo 08-23-2015 no fibroblastoma noted or stenosis or thickened/ calcification   History of gastric ulcer 2013   History of nuclear stress test 06-17-2006   per dr Stanford Breed note dated 08-06-2015 , normal study   History of transient ischemic attack (TIA) 04/25/2006   severe left ICA stenosis/   04-29-2017 per pt no residual   HLD (hyperlipidemia)    HTN  (hypertension)    IBS (irritable bowel syndrome)    OA (osteoarthritis)    Pneumonia    Prolapsed internal hemorrhoids, grade 3    PVD (peripheral vascular disease) ([place])    Rectal bleed    SYMPTOM, ECCHYMOSES, SPONTANEOUS 06/16/2006   Qualifier: Diagnosis of  By: Leanne Chang MD, Bruce     Vertigo    Wears contact lenses    Past Surgical History:  Procedure Laterality Date   CAROTID ENDARTERECTOMY Bilateral left 04-29-2006 and right 08-17-2007 by  dr c. Scot Dock  [facility]   HEMORRHOID SURGERY N/A 05/07/2017   Procedure: ANAL EXAM UNDER ANESTHESIA, HEMORRHOIDECTOMY;  Surgeon: Leighton Ruff, MD;  Location: [facility];  Service: General;  Laterality: N/A;   NASAL SINUS SURGERY  age 58   TONSILLECTOMY  age 27   TRANSTHORACIC ECHOCARDIOGRAM  08/23/2015   ef 92-42%, grade 1 diastolic dysfunction/ mild AR and MR   VASCULAR SURGERY Bilateral    carotids   WRIST GANGLION EXCISION Right yrs ago   Patient Active Problem List   Diagnosis Date Noted   CAROTID BRUIT 11/21/2008   HYPERCHOLESTEROLEMIA 11/16/2008   Osteoarthritis 11/16/2008   Hyperlipemia 06/16/2006   Essential hypertension 06/16/2006   Cerebrovascular disease 06/16/2006   GERD 06/16/2006    REFERRING DIAG:  M25.512 (ICD-10-CM) -  Left shoulder pain, unspecified chronicity  M54.2 (ICD-10-CM) - Neck pain    THERAPY DIAG:  Left shoulder pain, unspecified chronicity  Cervicalgia  Muscle weakness (generalized)  PERTINENT HISTORY: Goes by [name] [name]; mild strokes (takes blood thinner)   PRECAUTIONS: none  SUBJECTIVE:  I've done all my exercises already.  Some upper trap pain on left.     Are you having pain? Yes 1-2/10   OBJECTIVE: from evaluation on 05/08/21   DIAGNOSTIC FINDINGS:  "It's tendonitis"    PATIENT SURVEYS:  FOTO 46%  COGNITION: Overall cognitive status: Within functional limits for tasks assessed   SENSATION: WFL   POSTURE:  WFLs   PALPATION: Tender points in bil upper traps and bil  cervical paraspinals       CERVICAL ROM:    Active ROM A/PROM (deg) 05/08/2021 A/PROM (deg) 05/15/21 5/9 5/23  Flexion 35 60 60 60  Extension 25  30 40  Right lateral flexion _0 Left lateral flexion 15 35 22 25 pain  Right rotation 30 40 42 30  Left rotation 18 50 22 32    (Blank rows = not tested)     Active ROM Right 4/13 Left 4/13 Left  05/20/21 5/9 5/18 5/23  Shoulder flexion 147 108 130 140 151 left  110  Shoulder extension          Shoulder abduction 168 75 94 152  100  Shoulder adduction          Shoulder extension          Shoulder internal rotation T10 Left iliac crest    L5   Shoulder external rotation C7 Left top of shoulder     C7  Elbow flexion          Elbow extension          Wrist flexion          Wrist extension          Wrist ulnar deviation          Wrist radial deviation          Wrist pronation          Wrist supination           (Blank rows = not tested)   UE MMT:   MMT Right 4/13 Left 4/13 Left 5/9 Left 5/18 5/23  Shoulder flexion 5 3 3+ 4 4+  Shoulder extension 5       Shoulder abduction 5 3 3+ 4 4  Shoulder adduction         Shoulder extension 5       Shoulder internal rotation 5 3 3+ 4 4+  Shoulder external rotation 5 3 3+ 4 4+  Middle trapezius         Lower trapezius         Elbow flexion         Elbow extension         Wrist flexion         Wrist extension         Wrist ulnar deviation         Wrist radial deviation         Wrist pronation         Wrist supination         Grip strength          (Blank rows = not tested)   CERVICAL SPECIAL TESTS:  Distraction test: Positive   PATIENT SURVEYS:  FOTO 46% 81%  5/23   TODAY'S TREATMENT:  5/23:  Arm bike L1 6 min forward Discussion and review of HEP Discussed areas of focus based on objective measurements       5/18: Arm bike L1 6 min forward Seated thoracic extension with ball 10x; 2# bicep curls with overhead press 10x2; 2# horizontal abduction  10x 2# shoulder bil flexion/scaption 10x  2# lateral raises 10x Green band rows 15x  Green band bil shoulder extensions 15x; Green band bil forward press 15x; Standing on blue band shrug up and back 15x  Standing blue band serratus hugs 10x   5/16: Arm bike L1 6 min forward Seated thoracic extension with ball 10x; 2# bicep curls with overhead press 10x; 2# horizontal abduction 10x 2# lateral raises 5x Green band rows 15x (pt given green band with handles for home) Green band bil shoulder extensions 15x       06/03/21: Arm bike L1 5 min forward Seated thoracic extension with ball 10x; Cervical rotation with towel self mob with movt 10x right/left Cervical extension with towel support 10x;  Manual:manual cervical distraction; elontation to bil neck and upper traps, cervical mobs, PA, sideglide and rotational mobs with movement.  Discussion of over the door traction device for home  05/29/21: Arm bike L1 4 min forward and back Seated thoracic extension with ball 10x; Cervical rotation with towel self mob with movt 10x right/left Pink ball to wall isometric 8 sec hold 5x Seated pink ball roll rotation 8x on wall Red band rows 15x Red band bil shoulder extensions 15x      05/27/21: Cervical rotation with towel self mob with movt 10x right/left Seated thoracic extension with ball 10x; Seated 1.5# biceps curls 10x Standing 1.5# bil shrugs 10x Standing snatch and press overhead 1.5# bil 10x right/left  Discussion on results vs. Time progress    Treatment on date: 05/20/21 Cervical A/ROM 3 ways: gentle in sitting  Arm bike: level 1x 6 min (3/3)- PT present to discuss progress Biceps curls: 2# 2x10   Trigger Point Dry-Needling  Treatment instructions: Expect mild to moderate muscle soreness. S/S of pneumothorax if dry needled over a lung field, and to seek immediate medical attention should they occur. Patient verbalized understanding of these instructions and  education.  Patient Consent Given: Yes Education handout provided: Previously provided Muscles treated: cervical multifidi, upper traps Treatment response/outcome: twitch response and improved tissue mobility  Elongation and trigger point release to neck after needling.  Skilled palpation and monitoring by PT during dry needling  Treatment on date: 05/15/21: Cervical A/ROM 3 ways: gentle in sitting and 1 set in supine (rotation and sidebending) Shoulder rolls x10   Manual: elontation to bil neck and upper traps, cervical mobs, PA, sideglide and rotational mobs with movement.   Treatment on date: 05/13/21 Cervical A/ROM 3 ways: gentle in sitting and 1 set in supine (rotation and sidebending) Shoulder rolls x10 Trigger Point Dry-Needling  Treatment instructions: Expect mild to moderate muscle soreness. S/S of pneumothorax if dry needled over a lung field, and to seek immediate medical attention should they occur. Patient verbalized understanding of these instructions and education.  Patient Consent Given: Yes Education handout provided: Previously provided Muscles treated: cervical multifidi, upper traps Treatment response/outcome: twitch response and improved tissue mobility  Elongation and trigger point release to neck after needling.  Skilled palpation and monitoring by PT during dry needling  Treatment 05/08/21 Discussion of treatment plan and benefits of DN  PATIENT EDUCATION:  Education details:[email or web address] Person educated: Patient Education method: Theatre stage manager Education comprehension: verbalized understanding     HOME EXERCISE PROGRAM: Access Code: [email or web address] URL: [email or web address] Date: 06/12/2021 Prepared by: Ruben Im  Exercises - Seated Cervical Flexion AROM  - 3 x daily - 7 x weekly - 1 sets - 3 reps - 20 hold - Seated Cervical Sidebending AROM  - 3 x daily - 7 x weekly - 1 sets - 3 reps - 20 hold - Seated Cervical Rotation AROM  - 3  x daily - 7 x weekly - 1 sets - 3 reps - 20 hold - Seated Correct Posture  - 1 x daily - 7 x weekly - 3 sets - 10 reps - Seated Shoulder Rolls  - 5 x daily - 7 x weekly - 1 sets - 10 reps - Seated Assisted Cervical Rotation with Towel  - 1 x daily - 7 x weekly - 1 sets - 10 reps - Seated Thoracic Lumbar Extension with Pectoralis Stretch  - 1 x daily - 7 x weekly - 1 sets - 10 reps - Seated Alternating Bicep Curls with Rotation and Dumbbells  - 1 x daily - 7 x weekly - 1 sets - 10 reps - Standing Shoulder Shrugs with Dumbbells  - 1 x daily - 7 x weekly - 1 sets - 10 reps - Seated Single Arm Shoulder Press with Dumbbell  - 1 x daily - 7 x weekly - 1 sets - 10 reps - Standing Row with Anchored Resistance  - 1 x daily - 7 x weekly - 1 sets - 10 reps - Shoulder extension with resistance - Neutral  - 1 x daily - 7 x weekly - 1 sets - 10 reps - Seated Bilateral Shoulder Scaption with Dumbbells  - 1 x daily - 7 x weekly - 1 sets - 10 reps - Standing Shoulder Shrug and Retraction with Resistance  - 1 x daily - 7 x weekly - 1 sets - 10 reps - Dynamic Hug with Resistance  - 1 x daily - 7 x weekly - 1 sets - 10 reps ASSESSMENT:   CLINICAL IMPRESSION:     The patient has progressed very well with improvements in left shoulder ROM especially external rotation (now able to reach the seat belt much easier) as well as elevation.  Her cervical ROM has improved as well.  Her FOTO score has significantly improved from 43% to 81% indicating minimal self perceived impairment.  She is highly compliant with her HEP including resistive band and light dumbell work.  She has met the majority of goals and agrees with discharge at this time.      OBJECTIVE IMPAIRMENTS decreased ROM, decreased strength, hypomobility, impaired perceived functional ability, impaired UE functional use, and pain.    ACTIVITY LIMITATIONS cleaning, driving, meal prep, occupation, and laundry.    PERSONAL FACTORS Time since onset of  injury/illness/exacerbation are also affecting patient's functional outcome.      REHAB POTENTIAL: Good   CLINICAL DECISION MAKING: Stable/uncomplicated   EVALUATION COMPLEXITY: Low     GOALS: Goals reviewed with patient? Yes   SHORT TERM GOALS: Target date: 06/05/2021   The patient will demonstrate knowledge of basic self care strategies and exercises to promote healing   Baseline:  Goal status: INITIAL   2.  The patient will have improved shoulder elevation ROM to at least 120 degrees needed for grooming/dressing purposes as well as reaching high shelves  Baseline: 130 degrees (  05/20/21) Goal status: MET   3.  Cervical rotation and sidebending ROM improved to 30 degrees needed for driving Baseline: 20-80 degrees Goal status: met  4.  The patient will report a 40% improvement in pain levels with functional activities which are currently difficult including turning her head when speaking to someone at church or when volunteering at Hospice Baseline: 50-60% (05/20/21) Goal status: MET     LONG TERM GOALS: Target date: 07/03/2021   The patient will be independent in a safe self progression of a home exercise program to promote further recovery of function   Baseline:  Goal status: met   2.  The patient will report a 75% improvement in pain levels with functional activities which are currently difficult including turning her head and reaching overhead "Close to 100%" Goal status: met   3.  The patient will have improved shoulder elevation ROM to at least 150 degrees needed for grooming/dressing purposes as well as reaching high shelves  Baseline:  Goal status: partially met   4.  The patient will have grossly 4+/5 strength needed to lift and object from a high shelf  Baseline:  Goal status: partially met   5.  Cervical ROM improved to 45 degrees rotation and 40 degrees extension needed for driving Baseline:  Goal status: partially met  6. FOTO score improved from 46% to  60% indicating improved function with less pain             Goal status: met     PLAN: PT FREQUENCY: 2x/week   PT DURATION: 8 weeks   PLANNED INTERVENTIONS: Therapeutic exercises, Therapeutic activity, Neuromuscular re-education, Balance training, Gait training, Patient/Family education, Joint mobilization, Aquatic Therapy, Dry Needling, Electrical stimulation, Cryotherapy, Moist heat, Taping, Traction, Ultrasound, Ionotophoresis 85m/ml Dexamethasone, and Manual therapy   PLAN FOR NEXT SESSION:   Discharge    PHYSICAL THERAPY DISCHARGE SUMMARY  Visits from Start of Care: 10  Current functional level related to goals / functional outcomes: See clinical impressions above   Remaining deficits: As above   Education / Equipment: HEP; pt has ordered a home over the door traction unit  Patient agrees to discharge. Patient goals were met. Patient is being discharged due to meeting the stated rehab goals.  SRuben Im PT 06/17/21 2:58 PM Phone: [phone]: [phone]  [facility] [place] [address]Phone # [phone] [phone]

## 2021-07-11 ENCOUNTER — Encounter: Payer: Self-pay | Admitting: Internal Medicine

## 2021-07-30 ENCOUNTER — Other Ambulatory Visit: Payer: Self-pay | Admitting: *Deleted

## 2021-07-30 MED ORDER — METOPROLOL SUCCINATE ER 50 MG PO TB24: ORAL_TABLET | ORAL | 0 refills | Status: DC

## 2021-11-14 ENCOUNTER — Other Ambulatory Visit: Payer: Self-pay | Admitting: *Deleted

## 2021-11-14 DIAGNOSIS — I10 Essential (primary) hypertension: Secondary | ICD-10-CM

## 2021-11-14 MED ORDER — LISINOPRIL 10 MG PO TABS: ORAL_TABLET | ORAL | 0 refills | Status: DC

## 2021-11-14 NOTE — Telephone Encounter (Signed)
Ok to fill but she needs  TOC appt

## 2021-12-23 ENCOUNTER — Encounter: Payer: Self-pay | Admitting: Family Medicine

## 2021-12-23 ENCOUNTER — Ambulatory Visit: Payer: Medicare PPO | Admitting: Family Medicine

## 2021-12-23 VITALS — BP 150/80 | HR 88 | Temp 98.3°F | Ht 61.0 in | Wt 145.7 lb

## 2021-12-23 DIAGNOSIS — R7303 Prediabetes: Secondary | ICD-10-CM | POA: Diagnosis not present

## 2021-12-23 DIAGNOSIS — E7800 Pure hypercholesterolemia, unspecified: Secondary | ICD-10-CM

## 2021-12-23 DIAGNOSIS — Z23 Encounter for immunization: Secondary | ICD-10-CM

## 2021-12-23 DIAGNOSIS — T466X5A Adverse effect of antihyperlipidemic and antiarteriosclerotic drugs, initial encounter: Secondary | ICD-10-CM

## 2021-12-23 DIAGNOSIS — R233 Spontaneous ecchymoses: Secondary | ICD-10-CM

## 2021-12-23 DIAGNOSIS — I10 Essential (primary) hypertension: Secondary | ICD-10-CM

## 2021-12-23 DIAGNOSIS — F17201 Nicotine dependence, unspecified, in remission: Secondary | ICD-10-CM | POA: Diagnosis not present

## 2021-12-23 DIAGNOSIS — G720 Drug-induced myopathy: Secondary | ICD-10-CM

## 2021-12-23 LAB — HEMOGLOBIN A1C: Hgb A1c MFr Bld: 6.5 % (ref 4.6–6.5)

## 2021-12-23 MED ORDER — LISINOPRIL 10 MG PO TABS: ORAL_TABLET | ORAL | 3 refills | Status: DC

## 2021-12-23 MED ORDER — BUPROPION HCL ER (XL) 300 MG PO TB24: ORAL_TABLET | ORAL | 1 refills | Status: DC

## 2021-12-23 MED ORDER — METOPROLOL SUCCINATE ER 50 MG PO TB24: ORAL_TABLET | ORAL | 3 refills | Status: DC

## 2021-12-23 NOTE — Assessment & Plan Note (Signed)
Patient's last A1C was 6.3, she is reducing sugar intake at home in her diet. Will recheck A1C today for monitoring.

## 2021-12-23 NOTE — Patient Instructions (Signed)
Check blood pressure daily for the next 2-3 weeks, your goal is to be less than 140/90. If your blood pressure is higher than this please call me and we will increase your lisinopril to 20 mg daily.

## 2021-12-23 NOTE — Assessment & Plan Note (Signed)
Current hypertension medications:       Sig   lisinopril (ZESTRIL) 10 MG tablet TAKE 1 TABLET BY MOUTH ONCE DAILY . APPOINTMENT REQUIRED FOR FUTURE REFILLS   metoprolol succinate (TOPROL-XL) 50 MG 24 hr tablet Take with or immediately following a meal.      BP is elevated in office today, recheck was the same number. I refilled her medications and advised that she check her BP daily. If her BP is above 140/90 consistently at home to call me and we will increase her lisinopril to 20 mg daily. Pt will RTC in 4 months for BP recheck.

## 2021-12-23 NOTE — Progress Notes (Signed)
Established Patient Office Visit  Subjective   Patient ID: [name] [name], female    DOB: 09-05-1945  Age: 76 y.o. MRN: [id number]  Chief Complaint  Patient presents with   Establish Care    Patient is here for transition of care visit.   HTN-- BP performed in office today and is elevated. She reports that she "always has high blood pressure" when she comes to the doctor. She reports compliance with her medication. She denies any chest pain, SOB or dizziness. We reviewed her medications and I have refilled them. She reports she does not check her BP at home but does have a BP cuff. I rechecked her BP today in the office and it was 150/80 again.   Prediabetes-- last A1C in April 2023 was 6.3, pt reports that she has cut out sugar out of her drinks in her diet. She denies any blurry vision or numbness/tingling in her feet. She is not currently on any medication for this, does not check sugars at home.   Easy bruising-- pt reports that she has had this problem for some time. She reports that she gets them very easily. I reviewed her blood work from March 2023 and her platelet count, PT/INR was normal. States she was taken off the plavix ans remains on aspirin 81 mg daily. States that she did not notice much of a difference in the bruising. Expresses concerns about being off the plavix given her history of CVA in the remote past. We discussed that the aging process causes loss of subcutaneous structures which cause the skin to be more prone to injury.   Current Outpatient Medications  Medication Instructions   aspirin EC 81 mg, Oral, Daily   buPROPion (WELLBUTRIN XL) 300 MG 24 hr tablet Take 1 tablet PO daily   Cholecalciferol (VITAMIN D3) 25 MCG (1000 UT) CAPS 1 capsule, Oral, Daily   cyanocobalamin (VITAMIN B12) 1,000 mcg, Oral, Daily   inclisiran (LEQVIO) 284 MG/1.5ML SOSY injection '284mg'$  [place] x 1 dose, repeat in 3 months, then use every 6 months.   lisinopril (ZESTRIL) 10 MG tablet TAKE 1  TABLET BY MOUTH ONCE DAILY . APPOINTMENT REQUIRED FOR FUTURE REFILLS   meclizine (ANTIVERT) 25 mg, Oral, 3 times daily PRN   metoprolol succinate (TOPROL-XL) 50 MG 24 hr tablet Take with or immediately following a meal.   pantoprazole (PROTONIX) 40 MG tablet TAKE 1 TABLET BY MOUTH ONCE DAILY. APPOINTMENT REQUIRED FOR FUTURE REFILLS.   tiZANidine (ZANAFLEX) 2-4 mg, Oral, 2 times daily PRN   traZODone (DESYREL) 50 MG tablet TAKE 1/2 TO 1 (ONE-HALF TO ONE) TABLET BY MOUTH AT BEDTIME AS NEEDED    Patient Active Problem List   Diagnosis Date Noted   Prediabetes 12/23/2021   Statin myopathy 12/23/2021   CAROTID BRUIT 11/21/2008   HYPERCHOLESTEROLEMIA 11/16/2008   Osteoarthritis 11/16/2008   Hyperlipemia 06/16/2006   Essential hypertension 06/16/2006   Cerebrovascular disease 06/16/2006   GERD 06/16/2006      Review of Systems  All other systems reviewed and are negative.     Objective:     BP (!) 150/80 (BP Location: Left Arm, Patient Position: Sitting, Cuff Size: Normal)   Pulse 88   Temp 98.3 F (36.8 C) (Oral)   Ht '5\' 1"'$  (1.549 m)   Wt 145 lb 11.2 oz (66.1 kg)   SpO2 99%   BMI 27.53 kg/m    Physical Exam Vitals reviewed.  Constitutional:      Appearance: Normal appearance. She is  well-groomed and normal weight.  Eyes:     Conjunctiva/sclera: Conjunctivae normal.  Neck:     Thyroid: No thyromegaly.  Cardiovascular:     Rate and Rhythm: Normal rate and regular rhythm.     Pulses: Normal pulses.     Heart sounds: S1 normal and S2 normal.  Pulmonary:     Effort: Pulmonary effort is normal.     Breath sounds: Normal breath sounds and air entry.  Abdominal:     General: Bowel sounds are normal.  Musculoskeletal:     Right lower leg: No edema.     Left lower leg: No edema.  Neurological:     Mental Status: She is alert and oriented to person, place, and time. Mental status is at baseline.     Gait: Gait is intact.  Psychiatric:        Mood and Affect: Mood and  affect normal.        Speech: Speech normal.        Behavior: Behavior normal.        Judgment: Judgment normal.     Last metabolic panel Lab Results  Component Value Date   GLUCOSE 115 (H) 04/25/2021   NA 138 04/25/2021   K 4.8 04/25/2021   CL 104 04/25/2021   CO2 28 04/25/2021   BUN 21 04/25/2021   CREATININE 1.05 04/25/2021   GFRNONAA >60 03/25/2019   CALCIUM 10.0 04/25/2021   PROT 7.3 04/25/2021   ALBUMIN 4.6 04/25/2021   BILITOT 0.3 04/25/2021   ALKPHOS 85 04/25/2021   AST 20 04/25/2021   ALT 18 04/25/2021   ANIONGAP 6 03/25/2019   Last lipids Lab Results  Component Value Date   CHOL 194 04/25/2021   HDL 47.80 04/25/2021   LDLCALC 87 07/03/2011   LDLDIRECT 113.0 04/25/2021   TRIG (H) 04/25/2021    436.0 Triglyceride is over 400; calculations on Lipids are invalid.   CHOLHDL 4 04/25/2021      The 10-year ASCVD risk score (Arnett DK, et al., 2019) is: 33.1%    Assessment & Plan:   Problem List Items Addressed This Visit       Unprioritized   HYPERCHOLESTEROLEMIA    Pt was in a research trial and was getting Leqvio injections, however she could not continue these due to the cost. She has had statin myopathy in reaction to statins in the past. I have documented this in the chart. I advised her to call the cardiologist to find out if there is a different medication she could try.      Relevant Medications   lisinopril (ZESTRIL) 10 MG tablet   metoprolol succinate (TOPROL-XL) 50 MG 24 hr tablet   Prediabetes (Chronic)    Patient's last A1C was 6.3, she is reducing sugar intake at home in her diet. Will recheck A1C today for monitoring.       Relevant Orders   Hemoglobin A1c (Completed)   Statin myopathy (Chronic)   Other Visit Diagnoses     Easy bruising    -  Primary  Unclear etiology, could be normal skin changes of aging. No abnormalities seen on labs. Pt rpeorts no changes in bruising since stopping the plavix. I advised her to speak with the  cardiologist to see if this medication should be restarted. For now continue aspirin 81 mg daily.   Essential hypertension, benign       Relevant Medications   lisinopril (ZESTRIL) 10 MG tablet 1 tablet daily   metoprolol succinate (TOPROL-XL)  50 MG 24 hr tablet daily  BP is elevated in office today, I advised that she continue to check BP daily at home and if her BP is consistently over 140/90 to call the office for further recommendations. Likely will need to increase her lisinopril to 20 mg daily.    Tobacco dependence in remission       Relevant Medications   Quit smoking 5-6 years ago, she wishes to remain on the wellbutrin 300 mg daily for continued craving control.  buPROPion (WELLBUTRIN XL) 300 MG 24 hr tablet   Immunization due       Relevant Orders   Flu Vaccine QUAD High Dose(Fluad) (Completed)       Return in about 4 months (around 04/27/2022) for Annual visit- will need blood work.    Farrel Conners, MD

## 2021-12-23 NOTE — Assessment & Plan Note (Signed)
Pt was in a research trial and was getting Leqvio injections, however she could not continue these due to the cost. She has had statin myopathy in reaction to statins in the past. I have documented this in the chart. I advised her to call the cardiologist to find out if there is a different medication she could try.

## 2021-12-30 ENCOUNTER — Other Ambulatory Visit: Payer: Self-pay | Admitting: Family Medicine

## 2021-12-30 ENCOUNTER — Telehealth: Payer: Self-pay | Admitting: Family Medicine

## 2021-12-30 DIAGNOSIS — I10 Essential (primary) hypertension: Secondary | ICD-10-CM

## 2021-12-30 MED ORDER — METOPROLOL SUCCINATE ER 50 MG PO TB24: 50.0000 mg | ORAL_TABLET | Freq: Every day | ORAL | 3 refills | Status: DC

## 2021-12-30 NOTE — Telephone Encounter (Signed)
I resent the rx with new instructions -- 1 tablet daily

## 2021-12-30 NOTE — Telephone Encounter (Signed)
[facility] [place] needs to know how many metoprolol succinate (TOPROL-XL) 50 MG 24 hr tablet pt must take a day  [place] [facility] ([place]), [place] - [address] Phone: [phone]  Fax: [phone]

## 2021-12-30 NOTE — Telephone Encounter (Signed)
Noted  

## 2022-01-13 ENCOUNTER — Other Ambulatory Visit: Payer: Self-pay | Admitting: *Deleted

## 2022-01-13 MED ORDER — TRAZODONE HCL 50 MG PO TABS: ORAL_TABLET | ORAL | 3 refills | Status: DC

## 2022-03-02 ENCOUNTER — Telehealth: Payer: Self-pay | Admitting: *Deleted

## 2022-03-02 NOTE — Telephone Encounter (Signed)
[facility] faxed a refill request for Clopidogrel '75mg'$ -take 1 tablet by mouth once daily-#90.  Message sent to PCP as the Rx is not listed on the current medication list.

## 2022-03-04 ENCOUNTER — Other Ambulatory Visit: Payer: Self-pay | Admitting: Family Medicine

## 2022-03-04 NOTE — Telephone Encounter (Signed)
Looks like she did get 90 tablets filled at a pharmacy in November --- can you ask her who was writing this for her?

## 2022-03-04 NOTE — Telephone Encounter (Signed)
Left a message for the patient to return my call.  

## 2022-03-05 ENCOUNTER — Other Ambulatory Visit (HOSPITAL_COMMUNITY): Payer: Self-pay | Admitting: Cardiology

## 2022-03-05 DIAGNOSIS — I6523 Occlusion and stenosis of bilateral carotid arteries: Secondary | ICD-10-CM

## 2022-03-05 NOTE — Telephone Encounter (Signed)
Spoke with the patient and she stated she discontinued the medication on 05/08/2021 as Dr Ethlyn Gallery talked with her heart doctor, who initially gave her this and advised she stop taking this.  Patient stated she has picked up the Rx in August of last year, November of last year and has the medication and has asked the pharmacist if she could donate this since she cannot return it.  Message sent to PCP.

## 2022-03-06 NOTE — Addendum Note (Signed)
Addended by: Agnes Lawrence on: 03/06/2022 10:03 AM   Modules accepted: Orders

## 2022-03-06 NOTE — Telephone Encounter (Signed)
Rx removed from the medication list as below.

## 2022-03-06 NOTE — Telephone Encounter (Signed)
Oh ok then it's ok to stop this medication. Ok to remove from the medication list.

## 2022-03-10 ENCOUNTER — Telehealth: Payer: Self-pay | Admitting: Family Medicine

## 2022-03-10 NOTE — Telephone Encounter (Signed)
Tried calling patient to  schedule Medicare Annual Wellness Visit (AWV) either virtually or in office.  my jabber number [phone]  No answer  phone busy   Last AWV   03/31/21 please schedule with Nurse Health Adviser  30 min for all AWV appointments

## 2022-03-21 ENCOUNTER — Emergency Department (HOSPITAL_COMMUNITY): Payer: Medicare PPO

## 2022-03-21 ENCOUNTER — Encounter (HOSPITAL_COMMUNITY): Payer: Self-pay | Admitting: Emergency Medicine

## 2022-03-21 ENCOUNTER — Other Ambulatory Visit: Payer: Self-pay

## 2022-03-21 ENCOUNTER — Emergency Department (HOSPITAL_COMMUNITY)
Admission: EM | Admit: 2022-03-21 | Discharge: 2022-03-22 | Disposition: A | Discharge status: Home / Self Care | Payer: Medicare PPO | Attending: Emergency Medicine | Admitting: Emergency Medicine

## 2022-03-21 DIAGNOSIS — S0990XA Unspecified injury of head, initial encounter: Secondary | ICD-10-CM | POA: Diagnosis present

## 2022-03-21 DIAGNOSIS — Z79899 Other long term (current) drug therapy: Secondary | ICD-10-CM | POA: Insufficient documentation

## 2022-03-21 DIAGNOSIS — S0083XA Contusion of other part of head, initial encounter: Secondary | ICD-10-CM | POA: Diagnosis not present

## 2022-03-21 DIAGNOSIS — W108XXA Fall (on) (from) other stairs and steps, initial encounter: Secondary | ICD-10-CM | POA: Insufficient documentation

## 2022-03-21 DIAGNOSIS — Z7982 Long term (current) use of aspirin: Secondary | ICD-10-CM | POA: Diagnosis not present

## 2022-03-21 DIAGNOSIS — Y9301 Activity, walking, marching and hiking: Secondary | ICD-10-CM | POA: Diagnosis not present

## 2022-03-21 DIAGNOSIS — I10 Essential (primary) hypertension: Secondary | ICD-10-CM | POA: Diagnosis not present

## 2022-03-21 DIAGNOSIS — R233 Spontaneous ecchymoses: Secondary | ICD-10-CM | POA: Insufficient documentation

## 2022-03-21 DIAGNOSIS — R202 Paresthesia of skin: Secondary | ICD-10-CM | POA: Insufficient documentation

## 2022-03-21 NOTE — ED Triage Notes (Signed)
Pt BIB EMS from home with c/o forehead hematoma after falling and hitting her head on the wall. Denies LOC or blood thinners. Endorses a headache

## 2022-03-21 NOTE — ED Provider Notes (Signed)
[facility] Provider Note   CSN: [id number] Arrival date & time: 03/21/22  2257     History {Add pertinent medical, surgical, social history, OB history to HPI:1} Chief Complaint  Patient presents with   [name] [name] is a 77 y.o. female.  HPI    This is a 77 year old female who presents after fall.  Patient reports mechanical fall when walking up her steps.  She states that she face planted.  She did not lose consciousness.  She is on aspirin daily but no other blood thinners.  She is complaining of pain in the forehead.  No neck pain.  She does report some tingling in the fingers.  Denies alcohol or drug use.  Denies hip or knee pain. Home Medications Prior to Admission medications   Medication Sig Start Date End Date Taking? Authorizing Provider  aspirin EC 81 MG tablet Take 1 tablet (81 mg total) by mouth daily. 06/15/17   Lelon Perla, MD  buPROPion (WELLBUTRIN XL) 300 MG 24 hr tablet Take 1 tablet PO daily 12/23/21   Farrel Conners, MD  Cholecalciferol (VITAMIN D3) 25 MCG (1000 UT) CAPS Take 1 capsule by mouth daily.    [provider]  inclisiran (LEQVIO) 284 MG/1.5ML SOSY injection '284mg'$  [place] x 1 dose, repeat in 3 months, then use every 6 months. 05/02/21   Koberlein, Steele Berg, MD  lisinopril (ZESTRIL) 10 MG tablet TAKE 1 TABLET BY MOUTH ONCE DAILY . APPOINTMENT REQUIRED FOR FUTURE REFILLS 12/23/21   Farrel Conners, MD  meclizine (ANTIVERT) 25 MG tablet Take 1 tablet (25 mg total) by mouth 3 (three) times daily as needed for dizziness. 04/19/19   Caren Macadam, MD  metoprolol succinate (TOPROL-XL) 50 MG 24 hr tablet Take 1 tablet (50 mg total) by mouth daily. Take with or immediately following a meal. 12/30/21   Farrel Conners, MD  pantoprazole (PROTONIX) 40 MG tablet TAKE 1 TABLET BY MOUTH ONCE DAILY. APPOINTMENT REQUIRED FOR FUTURE REFILLS. 03/07/21   Koberlein, Steele Berg, MD  tiZANidine (ZANAFLEX) 4  MG tablet Take 0.5-1 tablets (2-4 mg total) by mouth 2 (two) times daily as needed for muscle spasms. 06/04/21   Caren Macadam, MD  traZODone (DESYREL) 50 MG tablet TAKE 1/2 TO 1 (ONE-HALF TO ONE) TABLET BY MOUTH AT BEDTIME AS NEEDED 01/13/22   Farrel Conners, MD  vitamin B-12 (CYANOCOBALAMIN) 1000 MCG tablet Take 1,000 mcg by mouth daily.    [provider]      Allergies    Statins and Sulfa antibiotics    Review of Systems   Review of Systems  Musculoskeletal:  Negative for back pain and neck pain.  Neurological:  Positive for headaches. Negative for dizziness.  All other systems reviewed and are negative.   Physical Exam Updated Vital Signs BP (!) 201/80   Pulse 100   Temp 98 F (36.7 C) (Oral)   Resp (!) 24   Ht 1.549 m ('5\' 1"'$ )   Wt 66 kg   SpO2 100%   BMI 27.49 kg/m  Physical Exam Vitals and nursing note reviewed.  Constitutional:      Appearance: She is well-developed. She is not ill-appearing.  HENT:     Head: Normocephalic.     Comments: Hematoma noted over the left eyebrow extending into the left forehead, abrasions over the nose Eyes:     Pupils: Pupils are equal, round, and reactive to light.  Comments: Extraocular movements intact  Neck:     Comments: No midline C-spine tenderness palpation, step-off, deformity, normal range of motion Cardiovascular:     Rate and Rhythm: Normal rate and regular rhythm.     Heart sounds: Normal heart sounds.  Pulmonary:     Effort: Pulmonary effort is normal. No respiratory distress.     Breath sounds: No wheezing.  Abdominal:     Palpations: Abdomen is soft.  Musculoskeletal:     Cervical back: Normal range of motion and neck supple.     Comments: No evidence of long bone deformities or obvious injury  Skin:    General: Skin is warm and dry.  Neurological:     Mental Status: She is alert and oriented to person, place, and time.  Psychiatric:        Mood and Affect: Mood normal.     ED  Results / Procedures / Treatments   Labs (all labs ordered are listed, but only abnormal results are displayed) Labs Reviewed - No data to display  EKG None  Radiology No results found.  Procedures Procedures  {Document cardiac monitor, telemetry assessment procedure when appropriate:1}  Medications Ordered in ED Medications - No data to display  ED Course/ Medical Decision Making/ A&P   {   Click here for ABCD2, HEART and other calculatorsREFRESH Note before signing :1}                          Medical Decision Making Amount and/or Complexity of Data Reviewed Radiology: ordered.   ***  {Document critical care time when appropriate:1} {Document review of labs and clinical decision tools ie heart score, Chads2Vasc2 etc:1}  {Document your independent review of radiology images, and any outside records:1} {Document your discussion with family members, caretakers, and with consultants:1} {Document social determinants of health affecting pt's care:1} {Document your decision making why or why not admission, treatments were needed:1} Final Clinical Impression(s) / ED Diagnoses Final diagnoses:  None    Rx / DC Orders ED Discharge Orders     None

## 2022-03-22 MED ORDER — OXYCODONE-ACETAMINOPHEN 5-325 MG PO TABS
1.0000 | ORAL_TABLET | Freq: Once | ORAL | Status: AC
  Administered 2022-03-22: 1 via ORAL
  Filled 2022-03-22: qty 1

## 2022-03-22 NOTE — Discharge Instructions (Signed)
You were seen today after a fall.  Your head CT and neck imaging is reassuring.  Take Tylenol as needed for any ongoing pain.  Make sure that you are applying ice.  You will likely have a significant black eye.  Regarding the changes in your hand, this is likely related to the blood pressure cuff.  These are called petechiae.  These will resolve on their own.  They usually result from small breaks in capillaries which can come from pressure from the cuff.

## 2022-03-22 NOTE — ED Notes (Signed)
Ice pack provided. Awaiting medications for head pain.

## 2022-03-23 ENCOUNTER — Ambulatory Visit (HOSPITAL_COMMUNITY): Admission: RE | Admit: 2022-03-23 | Payer: Medicare PPO | Source: Ambulatory Visit

## 2022-03-23 ENCOUNTER — Encounter (HOSPITAL_COMMUNITY): Payer: Self-pay

## 2022-04-14 ENCOUNTER — Ambulatory Visit (HOSPITAL_COMMUNITY)
Admission: RE | Admit: 2022-04-14 | Discharge: 2022-04-14 | Disposition: A | Discharge status: Home / Self Care | Payer: Medicare PPO | Source: Ambulatory Visit | Attending: Cardiovascular Disease | Admitting: Cardiovascular Disease

## 2022-04-14 DIAGNOSIS — I6523 Occlusion and stenosis of bilateral carotid arteries: Secondary | ICD-10-CM | POA: Insufficient documentation

## 2022-04-15 ENCOUNTER — Other Ambulatory Visit: Payer: Self-pay | Admitting: *Deleted

## 2022-04-15 DIAGNOSIS — I6523 Occlusion and stenosis of bilateral carotid arteries: Secondary | ICD-10-CM

## 2022-04-17 ENCOUNTER — Encounter: Payer: Self-pay | Admitting: *Deleted

## 2022-04-23 ENCOUNTER — Encounter: Payer: Medicare PPO | Admitting: Family Medicine

## 2022-04-28 NOTE — Progress Notes (Signed)
HPI: FU cerebrovascular disease. 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 She does not have CAD and had a normal nuclear study 06/17/2006. Abdominal ultrasound August 2017 showed greater than 50% bilateral common iliac arteries.  No aneurysm. Echocardiogram July 2020 showed normal LV function, grade 1 diastolic dysfunction. Monitor August 2020 showed no significant arrhythmias. Carotid Dopplers March 2024 showed 40 to 59% right status post carotid endarterectomy and 1 to 39% left status post carotid endarterectomy.  Since last seen, there is no dyspnea, chest pain, palpitations or syncope.  Current Outpatient Medications  Medication Sig Dispense Refill   aspirin EC 81 MG tablet Take 1 tablet (81 mg total) by mouth daily. 90 tablet 3   buPROPion (WELLBUTRIN XL) 300 MG 24 hr tablet Take 1 tablet PO daily 90 tablet 1   Cholecalciferol (VITAMIN D3) 25 MCG (1000 UT) CAPS Take 1 capsule by mouth daily.     inclisiran (LEQVIO) 284 MG/1.5ML SOSY injection  [place] x 1 dose, repeat in 3 months, then use every 6 months. 1.5 mL 2   lisinopril (ZESTRIL) 10 MG tablet TAKE 1 TABLET BY MOUTH ONCE DAILY . APPOINTMENT REQUIRED FOR FUTURE REFILLS 90 tablet 3   meclizine (ANTIVERT) 25 MG tablet Take 1 tablet (25 mg total) by mouth 3 (three) times daily as needed for dizziness. 30 tablet 2   metoprolol succinate (TOPROL-XL) 50 MG 24 hr tablet Take 1 tablet (50 mg total) by mouth daily. Take with or immediately following a meal. 90 tablet 3   pantoprazole (PROTONIX) 40 MG tablet TAKE 1 TABLET BY MOUTH ONCE DAILY. APPOINTMENT REQUIRED FOR FUTURE REFILLS. 90 tablet 1   tiZANidine (ZANAFLEX) 4 MG tablet Take 0.5-1 tablets (2-4 mg total) by mouth 2 (two) times daily as needed for muscle spasms. 30 tablet 2   traZODone (DESYREL) 50 MG tablet TAKE 1/2 TO 1 (ONE-HALF TO ONE) TABLET BY  MOUTH AT BEDTIME AS NEEDED 90 tablet 3   vitamin B-12 (CYANOCOBALAMIN) 1000 MCG tablet Take 1,000 mcg by mouth daily.     No current facility-administered medications for this visit.     Past Medical History:  Diagnosis Date   Aorto-iliac atherosclerosis    last aaa duplex 08-30-2015  >50% stenosis bilateral common iliac arteries and left external iliac artery   Arrhythmia    Carotid stenosis, bilateral    cardiologist-- dr Jens Som (LOV 08-06-2015)  04-29-2017 followed by pcp/  last duplex 03-08-2017 bilateral ICA 40-59% post endarterectomy,  right ECA occluded   Cerebral vascular disease    Chronic gastritis    Elevated cholesterol    Esophageal stricture    Full dentures    GERD (gastroesophageal reflux disease)    Hiatal hernia    History of aortic valve disorder    per cardiology note dated 08-06-2015 by dr Jens Som pt has hx small fibroblastoma aortic valve per echo 2008 /  last echo 08-23-2015 no fibroblastoma noted or stenosis or thickened/ calcification   History of gastric ulcer 2013   History of nuclear stress test 06-17-2006   per dr Jens Som note dated 08-06-2015 , normal study   History of transient ischemic attack (TIA) 04/25/2006   severe left ICA stenosis/   04-29-2017 per pt no residual   HLD (hyperlipidemia)    HTN (hypertension)    IBS (irritable bowel syndrome)    OA (osteoarthritis)    Pneumonia  Prolapsed internal hemorrhoids, grade 3    PVD (peripheral vascular disease)    Rectal bleed    SYMPTOM, ECCHYMOSES, SPONTANEOUS 06/16/2006   Qualifier: Diagnosis of  By: Cato Mulligan MD, Bruce     Vertigo    Wears contact lenses     Past Surgical History:  Procedure Laterality Date   CAROTID ENDARTERECTOMY Bilateral left 04-29-2006 and right 08-17-2007 by  dr c. [name]  [facility]   HEMORRHOID SURGERY N/A 05/07/2017   Procedure: ANAL EXAM UNDER ANESTHESIA, HEMORRHOIDECTOMY;  Surgeon: Romie Levee, MD;  [facility] ;  Service: General;   Laterality: N/A;   NASAL SINUS SURGERY  age 15   TONSILLECTOMY  age 82   TRANSTHORACIC ECHOCARDIOGRAM  08/23/2015   ef 60-65%, grade 1 diastolic dysfunction/ mild AR and MR   VASCULAR SURGERY Bilateral    carotids   WRIST GANGLION EXCISION Right yrs ago    Social History   Socioeconomic History   Marital status: Widowed    Spouse name: Not on file   Number of children: 0   Years of education: 14   Highest education level: Associate degree: occupational, Scientist, product/process development, or vocational program  Occupational History   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   Social Determinants of Health   Financial Resource Strain: Low Risk  (03/31/2021)   Overall Financial Resource Strain (CARDIA)    Difficulty of Paying Living Expenses: Not hard at all  Food Insecurity: No Food Insecurity (03/31/2021)   Hunger Vital Sign    Worried About Running Out of Food in the Last Year: Never true    Ran Out of Food in the Last Year: Never true  Transportation Needs: No Transportation Needs (03/31/2021)   PRAPARE - Administrator, Civil Service (Medical): No    Lack of Transportation (Non-Medical): No  Physical Activity:  Insufficiently Active (03/31/2021)   Exercise Vital Sign    Days of Exercise per Week: 3 days    Minutes of Exercise per Session: 30 min  Stress: No Stress Concern Present (03/31/2021)   [employer] of Occupational Health - Occupational Stress Questionnaire    Feeling of Stress : Not at all  Social Connections: Moderately Isolated (03/06/2020)   Social Connection and Isolation Panel [NHANES]    Frequency of Communication with Friends and Family: More than three times a week    Frequency of Social Gatherings with Friends and Family: More than three times a week    Attends Religious Services: More than 4 times per year    Active Member  of Clubs or Organizations: No    Attends Banker Meetings: Never    Marital Status: Widowed  Intimate Partner Violence: Not At Risk (03/06/2020)   Humiliation, Afraid, Rape, and Kick questionnaire    Fear of Current or Ex-Partner: No    Emotionally Abused: No    Physically Abused: No    Sexually Abused: No    Family History  Problem Relation Age of Onset   Stroke Father    Diabetes Mother    Asthma Mother    Heart disease Brother    Diabetes Other    Breast cancer Other    Rheum arthritis Sister    Colon cancer Neg Hx     ROS: Difficulty with balance but no fevers or chills, productive cough, hemoptysis, dysphasia, odynophagia, melena, hematochezia, dysuria, hematuria, rash, seizure activity, orthopnea, PND, pedal edema, claudication. Remaining systems are negative.  Physical Exam: Well-developed well-nourished in no acute distress.  Skin is warm and dry.  HEENT is normal.  Neck is supple.  Chest is clear to auscultation with normal expansion.  Cardiovascular exam is regular rate and rhythm.  Abdominal exam nontender or distended. No masses palpated. Extremities show no edema. neuro grossly intact  ECG-normal sinus rhythm with first-degree AV block, normal axis, no ST changes.  Personally reviewed  A/P  1 carotid artery  disease-plan follow-up carotid Dopplers March 2025.  2 hyperlipidemia-intolerant to statins.  Will check lipids.  Will refer back to lipid clinic for initiation of PCSK9 inhibitor.  3 hypertension-patient's blood pressure is elevated; increase lisinopril to 20 mg daily.  Check potassium and renal function in 1 week.  4 history of aortic valve fibroelastoma -not noted on most recent echocardiogram.  Will arrange follow-up study.  5 peripheral vascular disease-patient denies claudication.  Continue aspirin; intolerant to statins.  Olga Millers, MD

## 2022-05-08 ENCOUNTER — Encounter: Payer: Self-pay | Admitting: Cardiology

## 2022-05-08 ENCOUNTER — Ambulatory Visit: Payer: Medicare PPO | Attending: Cardiology | Admitting: Cardiology

## 2022-05-08 VITALS — BP 158/76 | HR 79 | Ht 61.0 in | Wt 144.6 lb

## 2022-05-08 DIAGNOSIS — I6523 Occlusion and stenosis of bilateral carotid arteries: Secondary | ICD-10-CM | POA: Diagnosis not present

## 2022-05-08 DIAGNOSIS — I739 Peripheral vascular disease, unspecified: Secondary | ICD-10-CM | POA: Diagnosis not present

## 2022-05-08 DIAGNOSIS — I10 Essential (primary) hypertension: Secondary | ICD-10-CM | POA: Diagnosis not present

## 2022-05-08 DIAGNOSIS — E7800 Pure hypercholesterolemia, unspecified: Secondary | ICD-10-CM | POA: Diagnosis not present

## 2022-05-08 MED ORDER — LISINOPRIL 20 MG PO TABS: 20.0000 mg | ORAL_TABLET | Freq: Every day | ORAL | 3 refills | Status: DC

## 2022-05-08 NOTE — Patient Instructions (Signed)
Medication Instructions:   INCREASE LISINOPRIL TO 20 MG ONCE DAILY=2 OF THE 10 MG TABLETS ONCE DAILY  *If you need a refill on your cardiac medications before your next appointment, please call your pharmacy*   Lab Work:  Your physician recommends that you return for lab work in: [facility]  If you have labs (blood work) drawn today and your tests are completely normal, you will receive your results only by: MyChart Message (if you have MyChart) OR A paper copy in the mail If you have any lab test that is abnormal or we need to change your treatment, we will call you to review the results.   Testing/Procedures:  Your physician has requested that you have an echocardiogram. Echocardiography is a painless test that uses sound waves to create images of your heart. It provides your doctor with information about the size and shape of your heart and how well your heart's chambers and valves are working. This procedure takes approximately one hour. There are no restrictions for this procedure. Please do NOT wear cologne, perfume, aftershave, or lotions (deodorant is allowed). Please arrive 15 minutes prior to your appointment time. [address]   Follow-Up: At [facility], you and your health needs are our priority.  As part of our continuing mission to provide you with exceptional heart care, we have created designated Provider Care Teams.  These Care Teams include your primary Cardiologist (physician) and Advanced Practice Providers (APPs -  Physician Assistants and Nurse Practitioners) who all work together to provide you with the care you need, when you need it.  We recommend signing up for the patient portal called "MyChart".  Sign up information is provided on this After Visit Summary.  MyChart is used to connect with patients for Virtual Visits (Telemedicine).  Patients are able to view lab/test results, encounter notes, upcoming appointments, etc.  Non-urgent  messages can be sent to your provider as well.   To learn more about what you can do with MyChart, go to [email or web address].    Your next appointment:   12 month(s)  Provider:   Olga Millers MD   Other Instructions  FOLLOW UP WITH PHARMACIST IN LIPID CLINIC

## 2022-05-14 ENCOUNTER — Telehealth: Payer: Self-pay | Admitting: Family Medicine

## 2022-05-14 ENCOUNTER — Other Ambulatory Visit (HOSPITAL_COMMUNITY): Payer: Self-pay | Admitting: Cardiology

## 2022-05-14 DIAGNOSIS — I6523 Occlusion and stenosis of bilateral carotid arteries: Secondary | ICD-10-CM

## 2022-05-14 LAB — LIPID PANEL
Chol/HDL Ratio: 5.1 ratio — ABNORMAL HIGH (ref 0.0–4.4)
Cholesterol, Total: 216 mg/dL — ABNORMAL HIGH (ref 100–199)
HDL: 42 mg/dL (ref >39)
LDL Chol Calc (NIH): 131 mg/dL — ABNORMAL HIGH (ref 0–99)
Triglycerides: 240 mg/dL — ABNORMAL HIGH (ref 0–149)
VLDL Cholesterol Cal: 43 mg/dL — ABNORMAL HIGH (ref 5–40)

## 2022-05-14 LAB — BASIC METABOLIC PANEL
BUN/Creatinine Ratio: 17 (ref 12–28)
BUN: 19 mg/dL (ref 8–27)
CO2: 22 mmol/L (ref 20–29)
Calcium: 9.6 mg/dL (ref 8.7–10.3)
Chloride: 104 mmol/L (ref 96–106)
Creatinine, Ser: 1.1 mg/dL — ABNORMAL HIGH (ref 0.57–1.00)
Glucose: 110 mg/dL — ABNORMAL HIGH (ref 70–99)
Potassium: 4.6 mmol/L (ref 3.5–5.2)
Sodium: 141 mmol/L (ref 134–144)
eGFR: 51 mL/min/{1.73_m2} — ABNORMAL LOW (ref >59)

## 2022-05-14 LAB — HEMOGLOBIN A1C
Est. average glucose Bld gHb Est-mCnc: 134 mg/dL
Hgb A1c MFr Bld: 6.3 % — ABNORMAL HIGH (ref 4.8–5.6)

## 2022-05-14 NOTE — Telephone Encounter (Signed)
Called patient to schedule Medicare Annual Wellness Visit (AWV). Left message for patient to call back and schedule Medicare Annual Wellness Visit (AWV).  Last date of AWV: 03/31/21  Please schedule an appointment at any time with [facility] beverly or hannah kim.  If any questions, please contact me at [phone].  Thank you ,  Rudell Cobb AWV direct phone # [phone]

## 2022-05-18 ENCOUNTER — Telehealth: Payer: Self-pay | Admitting: *Deleted

## 2022-05-18 NOTE — Telephone Encounter (Signed)
-----   Message from Lewayne Bunting, MD sent at 05/14/2022  8:18 AM EDT ----- Fu lipid clinic Olga Millers

## 2022-05-18 NOTE — Telephone Encounter (Signed)
Left message for pt to call.

## 2022-05-21 ENCOUNTER — Encounter: Payer: Self-pay | Admitting: *Deleted

## 2022-05-21 ENCOUNTER — Other Ambulatory Visit: Payer: Self-pay | Admitting: *Deleted

## 2022-05-21 DIAGNOSIS — E7800 Pure hypercholesterolemia, unspecified: Secondary | ICD-10-CM

## 2022-05-21 NOTE — Telephone Encounter (Signed)
Letter of results sent to pt to call and schedule appt Letter of results sent to pt  Referral placed

## 2022-06-01 ENCOUNTER — Ambulatory Visit (INDEPENDENT_AMBULATORY_CARE_PROVIDER_SITE_OTHER): Payer: Medicare PPO | Admitting: Family Medicine

## 2022-06-01 ENCOUNTER — Encounter: Payer: Self-pay | Admitting: Family Medicine

## 2022-06-01 VITALS — BP 120/80 | HR 75 | Temp 98.4°F | Ht 61.0 in | Wt 139.6 lb

## 2022-06-01 DIAGNOSIS — Z0000 Encounter for general adult medical examination without abnormal findings: Secondary | ICD-10-CM

## 2022-06-01 DIAGNOSIS — R296 Repeated falls: Secondary | ICD-10-CM

## 2022-06-01 NOTE — Patient Instructions (Signed)

## 2022-06-01 NOTE — Progress Notes (Signed)
Complete physical exam  Patient: [name] [name]   DOB: August 13, 1945   77 y.o. Female  MRN: [id number]  Subjective:    Chief Complaint  Patient presents with   Annual Exam    [name] [name] is a 77 y.o. female who presents today for a complete physical exam. She reports consuming a general diet. Home exercise routine includes walking 1 hrs per week. She generally feels well. She reports sleeping fairly well. She does not have additional problems to discuss today.   Patient did sustain a fall February 24,2024. She did not lose consciousness at that time. I reviewed her ER notes and her CT scans from Mar 22, 2022.  Most recent fall risk assessment:    03/31/2021    3:21 PM  Fall Risk   Falls in the past year? 1  Comment slipped on the wet floor  Number falls in past yr: 0  Injury with Fall? 0  Risk for fall due to : Medication side effect  Follow up Falls evaluation completed;Education provided;Falls prevention discussed     Most recent depression screenings:    06/01/2022   10:31 AM 12/23/2021    9:26 AM  PHQ 2/9 Scores  PHQ - 2 Score 2 1  PHQ- 9 Score 7 6    Vision:Within last year  Patient Active Problem List   Diagnosis Date Noted   Prediabetes 12/23/2021   Statin myopathy 12/23/2021   CAROTID BRUIT 11/21/2008   HYPERCHOLESTEROLEMIA 11/16/2008   Osteoarthritis 11/16/2008   Hyperlipemia 06/16/2006   Essential hypertension 06/16/2006   Cerebrovascular disease 06/16/2006   GERD 06/16/2006      Patient Care Team: Karie Georges, MD as PCP - General (Family Medicine) Jens Som Madolyn Frieze, MD as Consulting Physician (Cardiology) Sheppard Evens (Ophthalmology)   Outpatient Medications Prior to Visit  Medication Sig   aspirin EC 81 MG tablet Take 1 tablet (81 mg total) by mouth daily.   buPROPion (WELLBUTRIN XL) 300 MG 24 hr tablet Take 1 tablet PO daily   Cholecalciferol (VITAMIN D3) 25 MCG (1000 UT) CAPS Take 1 capsule by mouth daily.   inclisiran (LEQVIO) 284  MG/1.5ML SOSY injection 284mg  [place] x 1 dose, repeat in 3 months, then use every 6 months.   lisinopril (ZESTRIL) 20 MG tablet Take 1 tablet (20 mg total) by mouth daily. TAKE 1 TABLET BY MOUTH ONCE DAILY . APPOINTMENT REQUIRED FOR FUTURE REFILLS   meclizine (ANTIVERT) 25 MG tablet Take 1 tablet (25 mg total) by mouth 3 (three) times daily as needed for dizziness.   metoprolol succinate (TOPROL-XL) 50 MG 24 hr tablet Take 1 tablet (50 mg total) by mouth daily. Take with or immediately following a meal.   pantoprazole (PROTONIX) 40 MG tablet TAKE 1 TABLET BY MOUTH ONCE DAILY. APPOINTMENT REQUIRED FOR FUTURE REFILLS.   tiZANidine (ZANAFLEX) 4 MG tablet Take 0.5-1 tablets (2-4 mg total) by mouth 2 (two) times daily as needed for muscle spasms.   traZODone (DESYREL) 50 MG tablet TAKE 1/2 TO 1 (ONE-HALF TO ONE) TABLET BY MOUTH AT BEDTIME AS NEEDED   vitamin B-12 (CYANOCOBALAMIN) 1000 MCG tablet Take 1,000 mcg by mouth daily.   No facility-administered medications prior to visit.    Review of Systems  HENT:  Negative for hearing loss.   Eyes:  Negative for blurred vision.  Respiratory:  Negative for shortness of breath.   Cardiovascular:  Negative for chest pain.  Gastrointestinal: Negative.   Genitourinary: Negative.   Musculoskeletal:  Negative  for back pain.  Neurological:  Negative for headaches.  Psychiatric/Behavioral:  Negative for depression.   All other systems reviewed and are negative.      Objective:     BP 120/80 (BP Location: Left Arm, Patient Position: Sitting, Cuff Size: Normal)   Pulse 75   Temp 98.4 F (36.9 C) (Oral)   Ht 5\' 1"  (1.549 m)   Wt 139 lb 9.6 oz (63.3 kg)   SpO2 98%   BMI 26.38 kg/m    Physical Exam Vitals reviewed.  Constitutional:      Appearance: Normal appearance. She is well-groomed and normal weight.  HENT:     Right Ear: Tympanic membrane and ear canal normal.     Left Ear: Tympanic membrane and ear canal normal.  Eyes:      Conjunctiva/sclera: Conjunctivae normal.  Neck:     Thyroid: No thyromegaly.  Cardiovascular:     Rate and Rhythm: Normal rate and regular rhythm.     Pulses: Normal pulses.     Heart sounds: S1 normal and S2 normal.  Pulmonary:     Effort: Pulmonary effort is normal.     Breath sounds: Normal breath sounds and air entry.  Abdominal:     General: Bowel sounds are normal.  Musculoskeletal:     Right lower leg: No edema.     Left lower leg: No edema.  Neurological:     Mental Status: She is alert and oriented to person, place, and time. Mental status is at baseline.     Gait: Gait is intact.  Psychiatric:        Mood and Affect: Mood and affect normal.        Speech: Speech normal.        Behavior: Behavior normal.        Judgment: Judgment normal.      No results found for any visits on 06/01/22. Last metabolic panel Lab Results  Component Value Date   GLUCOSE 110 (H) 05/13/2022   NA 141 05/13/2022   K 4.6 05/13/2022   CL 104 05/13/2022   CO2 22 05/13/2022   BUN 19 05/13/2022   CREATININE 1.10 (H) 05/13/2022   EGFR 51 (L) 05/13/2022   CALCIUM 9.6 05/13/2022   PROT 7.3 04/25/2021   ALBUMIN 4.6 04/25/2021   BILITOT 0.3 04/25/2021   ALKPHOS 85 04/25/2021   AST 20 04/25/2021   ALT 18 04/25/2021   ANIONGAP 6 03/25/2019   Last lipids Lab Results  Component Value Date   CHOL 216 (H) 05/13/2022   HDL 42 05/13/2022   LDLCALC 131 (H) 05/13/2022   LDLDIRECT 113.0 04/25/2021   TRIG 240 (H) 05/13/2022   CHOLHDL 5.1 (H) 05/13/2022   Last hemoglobin A1c Lab Results  Component Value Date   HGBA1C 6.3 (H) 05/13/2022        Assessment & Plan:    Routine Health Maintenance and Physical Exam  Immunization History  Administered Date(s) Administered   Fluad Quad(high Dose 65+) 01/15/2021, 12/23/2021   Influenza-Unspecified 01/26/2018, 12/27/2019   PFIZER(Purple Top)SARS-COV-2 Vaccination 01/04/2020, 01/25/2020   Pneumococcal Conjugate-13 12/11/2013, 03/02/2018    Pneumococcal Polysaccharide-23 01/24/2015   Tdap 01/26/2009   Zoster Recombinat (Shingrix) 03/02/2018, 09/02/2018    Health Maintenance  Topic Date Due   DTaP/Tdap/Td (2 - Td or Tdap) 01/27/2019   COVID-19 Vaccine (3 - 2023-24 season) 09/26/2021   Medicare Annual Wellness (AWV)  04/01/2022   INFLUENZA VACCINE  08/27/2022   Pneumonia Vaccine 93+ Years old  Completed  DEXA SCAN  Completed   Hepatitis C Screening  Completed   Zoster Vaccines- Shingrix  Completed   HPV VACCINES  Aged Out   MAMMOGRAM  Discontinued   COLONOSCOPY (Pts 45-17yrs Insurance coverage will need to be confirmed)  Discontinued    Discussed health benefits of physical activity, and encouraged her to engage in regular exercise appropriate for her age and condition.  Frequent falls -     Ambulatory referral to Physical Therapy  Routine general medical examination at a health care facility  Normal physical exam findings today, she had a bad fall in February and is having issues with vertigo-like sensations, has gotten worse, will send to physical therapy for balance rehab.   Return in about 6 months (around 12/02/2022) for HTN.     Karie Georges, MD

## 2022-06-10 ENCOUNTER — Ambulatory Visit (HOSPITAL_COMMUNITY): Payer: Medicare PPO | Attending: Cardiology

## 2022-06-10 DIAGNOSIS — I10 Essential (primary) hypertension: Secondary | ICD-10-CM | POA: Diagnosis present

## 2022-06-10 LAB — ECHOCARDIOGRAM COMPLETE
Area-P 1/2: 2.79 cm2
P 1/2 time: 404 msec
S' Lateral: 2.5 cm

## 2022-06-12 ENCOUNTER — Encounter: Payer: Self-pay | Admitting: *Deleted

## 2022-06-29 ENCOUNTER — Ambulatory Visit: Payer: Medicare PPO | Admitting: Physical Therapy

## 2022-06-29 ENCOUNTER — Ambulatory Visit: Payer: Medicare PPO

## 2022-07-01 ENCOUNTER — Other Ambulatory Visit: Payer: Self-pay | Admitting: *Deleted

## 2022-07-01 DIAGNOSIS — M542 Cervicalgia: Secondary | ICD-10-CM

## 2022-07-02 MED ORDER — TIZANIDINE HCL 4 MG PO TABS: 2.0000 mg | ORAL_TABLET | Freq: Two times a day (BID) | ORAL | 0 refills | Status: DC | PRN

## 2022-07-07 ENCOUNTER — Encounter: Payer: Self-pay | Admitting: Cardiology

## 2022-07-07 ENCOUNTER — Encounter: Payer: Self-pay | Admitting: Nurse Practitioner

## 2022-07-20 ENCOUNTER — Encounter: Payer: Self-pay | Admitting: Pharmacist Clinician (PhC)/ Clinical Pharmacy Specialist

## 2022-07-20 ENCOUNTER — Telehealth: Payer: Self-pay

## 2022-07-20 ENCOUNTER — Ambulatory Visit: Payer: Medicare PPO | Attending: Cardiology | Admitting: Pharmacist Clinician (PhC)/ Clinical Pharmacy Specialist

## 2022-07-20 ENCOUNTER — Other Ambulatory Visit (HOSPITAL_COMMUNITY): Payer: Self-pay

## 2022-07-20 ENCOUNTER — Telehealth: Payer: Self-pay | Admitting: Pharmacist Clinician (PhC)/ Clinical Pharmacy Specialist

## 2022-07-20 DIAGNOSIS — E782 Mixed hyperlipidemia: Secondary | ICD-10-CM | POA: Diagnosis not present

## 2022-07-20 NOTE — Progress Notes (Signed)
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 female patient of Dr Jens Som, to discuss options for cholesterol management.  Patient had previously been enrolled in ORION-10 and then ORION-8 (inclisiran, inclisiran long term extension study) (2018-2022).  Last dose of Inclisiran was 06/25/20.  Insurance Carrier:  [employer] employees Medicare  LDL Cholesterol goal:  LDL < 70  Current Medications:  inclisiran 284 mg q26m  Previously tried: rosuvastatin, atorvastatin, simvastatin  - myalgias   Family Hx:  no cardiac disease in parents; brother had aortic aneurysm;   Social Hx: Tobacco: no, quit 5 years ago Alcohol: no     Diet:  more home cooked meal, no deep frying; variety of protein, not much pork, more fish; vegetables daily, oatmeal, cereals    Exercise: doing shoulder exercises, limited by joint pains  Adherence Assessment  Do you ever forget to take your medication? [] Yes [x] No  Do you ever skip doses due to side effects? [] Yes [x] No  Do you have trouble affording your medicines? [] Yes [x] No  Are you ever unable to pick up your medication due to transportation difficulties? [] Yes [x] No   Adherence strategy: 7 day minder  Accessory Clinical Findings   Lab Results   Component Value Date   CHOL 216 (H) 05/13/2022   HDL 42 05/13/2022   LDLCALC 131 (H) 05/13/2022   LDLDIRECT 113.0 04/25/2021   TRIG 240 (H) 05/13/2022   CHOLHDL 5.1 (H) 05/13/2022    No results found for: "LIPOA"  Lab Results  Component Value Date   ALT 18 04/25/2021   AST 20 04/25/2021   ALKPHOS 85 04/25/2021   BILITOT 0.3 04/25/2021   Lab Results  Component Value Date   CREATININE 1.10 (H) 05/13/2022   BUN 19 05/13/2022   NA 141 05/13/2022   K 4.6 05/13/2022   CL 104 05/13/2022   CO2 22 05/13/2022   Lab Results  Component Value Date   HGBA1C 6.3 (H) 05/13/2022    Home Medications    Current Outpatient Medications  Medication Sig Dispense Refill   aspirin EC 81 MG tablet Take 1 tablet (81 mg total) by mouth daily. 90 tablet 3   buPROPion (WELLBUTRIN XL) 300 MG 24 hr tablet Take 1 tablet PO daily 90 tablet 1   Cholecalciferol (VITAMIN D3) 25 MCG (1000 UT) CAPS Take 1 capsule by mouth daily.     lisinopril (ZESTRIL) 20 MG tablet Take 1 tablet (20 mg total) by mouth daily. TAKE 1 TABLET BY MOUTH ONCE DAILY . APPOINTMENT REQUIRED FOR FUTURE REFILLS 90 tablet 3   meclizine (ANTIVERT) 25 MG tablet Take 1 tablet (25 mg total) by mouth 3 (three) times daily as needed for dizziness.  30 tablet 2   metoprolol succinate (TOPROL-XL) 50 MG 24 hr tablet Take 1 tablet (50 mg total) by mouth daily. Take with or immediately following a meal. 90 tablet 3   pantoprazole (PROTONIX) 40 MG tablet TAKE 1 TABLET BY MOUTH ONCE DAILY. APPOINTMENT REQUIRED FOR FUTURE REFILLS. 90 tablet 1   tiZANidine (ZANAFLEX) 4 MG tablet Take 0.5-1 tablets (2-4 mg total) by mouth 2 (two) times daily as needed for muscle spasms. 30 tablet 0   traZODone (DESYREL) 50 MG tablet TAKE 1/2 TO 1 (ONE-HALF TO ONE) TABLET BY MOUTH AT BEDTIME AS NEEDED 90 tablet 3   vitamin B-12 (CYANOCOBALAMIN) 1000 MCG tablet Take 1,000 mcg by mouth daily.     No current facility-administered medications for this visit.      Assessment & Plan    Hyperlipemia Assessment: Patient with ASCVD not at LDL goal of < 70 Most recent LDL 131 on 05/13/22 Not able to tolerate statins secondary to myalgias - atorvastatin, rosuvastatin, simvastatin Reviewed options for lowering LDL cholesterol, including ezetimibe, PCSK-9 inhibitors, bempedoic acid and inclisiran.  Discussed mechanisms of action, dosing, side effects, potential decreases in LDL cholesterol and costs.  Also reviewed potential options for patient assistance. Patient previously in [place] studies, last dose of inclisiran was 05/2020  Plan: Patient agreeable to starting Repatha 140 mg q14d Repeat labs after:  3 months Lipid Liver function Patient was given information on [employer] - will sign patient up when PA approved Marital status -widowed Income < $72,000 (single) or < $102,000 (married) - yes   Phillips Hay, PharmD CPP [facility] [address]  [place], [place] [address] [phone]  07/20/2022, 11:49 AM

## 2022-07-20 NOTE — Patient Instructions (Signed)
Your Results:             Your most recent labs Goal  Total Cholesterol 216 < 200  Triglycerides 240 < 150  HDL (happy/good cholesterol) 42 > 40  LDL (lousy/bad cholesterol 131 < 70   Medication changes:  We will start the process to get Repatha covered by your insurance.  Once the prior authorization is complete, I will call/send a MyChart message to let you know and confirm pharmacy information.   You will take one injection  Lab orders:  We want to repeat labs after 2-3 months.  We will send you a lab order to remind you once we get closer to that time.    Patient Assistance:    We will sign you up for a [employer] Grant once your medication is approved by [employer].  I will call you with the ID number, then you will take this information to the pharmacy.  They will bill it after your insurance, bringing your copay to $0.  The grant will pay the first $2,500 in a one year period.    ID   BIN [id number]  PCN [id number]  GRP [id number]    Thank you for choosing [facility]

## 2022-07-20 NOTE — Assessment & Plan Note (Signed)
Assessment: Patient with ASCVD not at LDL goal of < 70 Most recent LDL 131 on 05/13/22 Not able to tolerate statins secondary to myalgias - atorvastatin, rosuvastatin, simvastatin Reviewed options for lowering LDL cholesterol, including ezetimibe, PCSK-9 inhibitors, bempedoic acid and inclisiran.  Discussed mechanisms of action, dosing, side effects, potential decreases in LDL cholesterol and costs.  Also reviewed potential options for patient assistance. Patient previously in [facility] studies, last dose of inclisiran was 05/2020  Plan: Patient agreeable to starting Repatha 140 mg q14d Repeat labs after:  3 months Lipid Liver function Patient was given information on [employer] - will sign patient up when PA approved Marital status -widowed Income < $72,000 (single) or < $102,000 (married) - yes

## 2022-07-20 NOTE — Telephone Encounter (Signed)
Pharmacy Patient Advocate Encounter   Received notification from [facility]  that prior authorization for REPATHA 140MG /ML is required/requested.   PA submitted to [facility] via CoverMyMeds Key or [facility]) confirmation # [id number]   Status is pending

## 2022-07-20 NOTE — Telephone Encounter (Signed)
Please do PA for Repatha 

## 2022-07-20 NOTE — Telephone Encounter (Signed)
Pharmacy Patient Advocate Encounter  Prior Authorization for REPATHA has been APPROVED by [facility] from 1.1.24 to 12.31.24.   PA # [id number]

## 2022-07-21 MED ORDER — REPATHA SURECLICK 140 MG/ML ~~LOC~~ SOAJ: 140.0000 mg | SUBCUTANEOUS | 3 refills | Status: DC

## 2022-07-21 NOTE — Telephone Encounter (Signed)
See other encounter.

## 2022-07-21 NOTE — Telephone Encounter (Signed)
[employer] approved to 06/20/23  ID [id number] BIN   [id number] PCN   [id number] GRP   [id number]  [facility] with information.

## 2022-07-28 ENCOUNTER — Other Ambulatory Visit: Payer: Self-pay

## 2022-07-28 ENCOUNTER — Ambulatory Visit: Payer: Medicare PPO | Attending: Family Medicine | Admitting: Rehabilitative and Restorative Service Providers"

## 2022-07-28 ENCOUNTER — Encounter: Payer: Self-pay | Admitting: Rehabilitative and Restorative Service Providers"

## 2022-07-28 DIAGNOSIS — R296 Repeated falls: Secondary | ICD-10-CM | POA: Diagnosis not present

## 2022-07-28 DIAGNOSIS — M6281 Muscle weakness (generalized): Secondary | ICD-10-CM | POA: Diagnosis present

## 2022-07-28 DIAGNOSIS — R2681 Unsteadiness on feet: Secondary | ICD-10-CM | POA: Diagnosis present

## 2022-07-28 DIAGNOSIS — R2689 Other abnormalities of gait and mobility: Secondary | ICD-10-CM | POA: Diagnosis present

## 2022-07-28 DIAGNOSIS — M542 Cervicalgia: Secondary | ICD-10-CM | POA: Diagnosis present

## 2022-07-28 NOTE — Therapy (Addendum)
OUTPATIENT PHYSICAL THERAPY NEURO EVALUATION   Patient Name: [name] [name] MRN: [id number] DOB:10/11/1945, 77 y.o., female Today's Date: 07/28/2022 PCP: Karie Georges, MD REFERRING PROVIDER: Karie Georges, MD  END OF SESSION:  PT End of Session - 07/28/22 1623     Visit Number 1    Number of Visits 16    Date for PT Re-Evaluation 09/26/22    Authorization Type [employer] medic[employer]    PT Start Time 1520    PT Stop Time 1618    PT Time Calculation (min) 58 min    Activity Tolerance Patient tolerated treatment well    Behavior During Therapy [facility] for tasks assessed/performed            Past Medical History:  Diagnosis Date   Aorto-iliac atherosclerosis (HCC)    last aaa duplex 08-30-2015  >50% stenosis bilateral common iliac arteries and left external iliac artery   Arrhythmia    Carotid stenosis, bilateral    cardiologist-- dr Jens Som (LOV 08-06-2015)  04-29-2017 followed by pcp/  last duplex 03-08-2017 bilateral ICA 40-59% post endarterectomy,  right ECA occluded   Cerebral vascular disease    Chronic gastritis    Elevated cholesterol    Esophageal stricture    Full dentures    GERD (gastroesophageal reflux disease)    Hiatal hernia    History of aortic valve disorder    per cardiology note dated 08-06-2015 by dr Jens Som pt has hx small fibroblastoma aortic valve per echo 2008 /  last echo 08-23-2015 no fibroblastoma noted or stenosis or thickened/ calcification   History of gastric ulcer 2013   History of nuclear stress test 06-17-2006   per dr Jens Som note dated 08-06-2015 , normal study   History of transient ischemic attack (TIA) 04/25/2006   severe left ICA stenosis/   04-29-2017 per pt no residual   HLD (hyperlipidemia)    HTN (hypertension)    IBS (irritable bowel syndrome)    OA (osteoarthritis)    Pneumonia    Prolapsed internal hemorrhoids, grade 3    PVD (peripheral vascular disease) (HCC)    Rectal bleed    SYMPTOM, ECCHYMOSES, SPONTANEOUS  06/16/2006   Qualifier: Diagnosis of  By: Cato Mulligan MD, Bruce     Vertigo    Wears contact lenses    Past Surgical History:  Procedure Laterality Date   CAROTID ENDARTERECTOMY Bilateral left 04-29-2006 and right 08-17-2007 by  dr c. [name]  [facility]   HEMORRHOID SURGERY N/A 05/07/2017   Procedure: ANAL EXAM UNDER ANESTHESIA, HEMORRHOIDECTOMY;  Surgeon: Romie Levee, MD;  Location: [facility];  Service: General;  Laterality: N/A;   NASAL SINUS SURGERY  age 34   TONSILLECTOMY  age 30   TRANSTHORACIC ECHOCARDIOGRAM  08/23/2015   ef 60-65%, grade 1 diastolic dysfunction/ mild AR and MR   VASCULAR SURGERY Bilateral    carotids   WRIST GANGLION EXCISION Right yrs ago   Patient Active Problem List   Diagnosis Date Noted   Prediabetes 12/23/2021   Statin myopathy 12/23/2021   CAROTID BRUIT 11/21/2008   HYPERCHOLESTEROLEMIA 11/16/2008   Osteoarthritis 11/16/2008   Hyperlipemia 06/16/2006   Essential hypertension 06/16/2006   Cerebrovascular disease 06/16/2006   GERD 06/16/2006    ONSET DATE: 06/01/2022 REFERRING DIAG:  Diagnosis  R29.6 (ICD-10-CM) - Frequent falls   THERAPY DIAG:  Muscle weakness (generalized)  Other abnormalities of gait and mobility  Unsteadiness on feet  Rationale for Evaluation and Treatment: Rehabilitation  SUBJECTIVE:  SUBJECTIVE STATEMENT: The patient reports a significant fall March 21, 2022. She describes a sensation that her legs stopped and her body didn't-- she fell face forward and hit her head on the concrete. She still has a knot on her head. She has had vertigo in the past and does not think she is currently experiencing vertigo.  Pt accompanied by: self  PERTINENT HISTORY: cerebrovascular disease, hyperlipidemia, HTN, OA, hypercholesterolemia  PAIN:   Are you having pain? Yes: NPRS scale: 8/10 Pain location: both knees Pain description: bruised sensation  Aggravating factors: walking in stores, walking longer distances Relieving factors: unsure, arthritic pain  PRECAUTIONS: Fall  WEIGHT BEARING RESTRICTIONS: No  FALLS: Has patient fallen in last 6 months? Yes. Number of falls 5  4 minor falls, 1 with injury  LIVING ENVIRONMENT: Lives with: lives alone Lives in: House/apartment Stairs: Yes: Internal: 4-5 steps; one side and External: 8 steps; one rail-- steep steps Has following equipment at home: None  PLOF: Independent  PATIENT GOALS: reduce falls  OBJECTIVE:  COGNITION: Overall cognitive status: Within functional limits for tasks assessed   SENSATION: WFL  POSTURE: No Significant postural limitations  UPPER EXTREMITY ROM: dec'd shoulder flexion to 95 degrees bilaterally, can reach behind her back with the left arm, but not the right arm. She has tightness in her neck and shoulders   LOWER EXTREMITY AROM: WFLs, tightness in gastrocs due to shoewear (wears dress shoes)  LOWER EXTREMITY MMT   MMT Right Eval Left Eval  Hip flexion 4/5 4/5  Hip extension    Hip abduction    Hip adduction    Hip internal rotation    Hip external rotation    Knee flexion 4/5 4/5  Knee extension 4/5 4/5  Ankle dorsiflexion 4/5 4/5  Ankle plantarflexion    Ankle inversion    Ankle eversion     (Blank rows = not tested)  BED MOBILITY:  independent  TRANSFERS: Sit to stand: Modified independence Stand to sit: Modified independence *uses hands  STAIRS: Level of Assistance: Modified independence Stair Negotiation Technique: Alternating Pattern  with Bilateral Rails Number of Stairs: 3   Comments: slowed pace, hesitates with steps descending  GAIT: Gait pattern:  had imbalance during gait noted with multiple lateral steps to correct for loss of balance Distance walked: 150 ft Assistive device utilized: None Level of  assistance: Modified independence Comments: slowed pace Gait speed=2.18 ft/sec   [facility] PT Assessment - 07/28/22 1546       Balance   Balance Assessed Yes      Standardized Balance Assessment   Standardized Balance Assessment Berg Balance Test      Berg Balance Test   Sit to Stand Able to stand  independently using hands    Standing Unsupported Able to stand safely 2 minutes    Sitting with Back Unsupported but Feet Supported on Floor or Stool Able to sit safely and securely 2 minutes    Stand to Sit Controls descent by using hands    Transfers Able to transfer safely, definite need of hands    Standing Unsupported with Eyes Closed Able to stand 10 seconds safely    Standing Unsupported with Feet Together Able to place feet together independently and stand for 1 minute with supervision    From Standing, Reach Forward with Outstretched Arm Can reach forward >5 cm safely (2")    From Standing Position, Pick up Object from Floor Unable to pick up shoe, but reaches 2-5 cm (1-2") from shoe  and balances independently    From Standing Position, Turn to Look Behind Over each Shoulder Turn sideways only but maintains balance    Turn 360 Degrees Needs close supervision or verbal cueing    Standing Unsupported, Alternately Place Feet on Step/Stool Able to complete >2 steps/needs minimal assist    Standing Unsupported, One Foot in Front Able to take small step independently and hold 30 seconds    Standing on One Leg Tries to lift leg/unable to hold 3 seconds but remains standing independently    Total Score 35    Berg comment: 35/56           Berg=35/56   PATIENT SURVEYS:  None indicated  VESTIBULAR ASSESSMENT: SYMPTOM BEHAVIOR:  Subjective history: patient notes difficulty with turns and looking up  Non-Vestibular symptoms:  unable to tolerate turns  Type of dizziness: Unsteady with head/body turns  Frequency: daily  Duration: with movement  Aggravating factors: Induced by motion:  turning head quickly  Relieving factors: head stationary  Progression of symptoms: unchanged  OCULOMOTOR EXAM:  Ocular Alignment: normal  Ocular ROM: No Limitations  Spontaneous Nystagmus: absent  Gaze-Induced Nystagmus: absent  Smooth Pursuits: intact   VESTIBULAR - OCULAR REFLEX:   Slow VOR: Comment: gets sensation of visual movement and starts slowing down after 3 reps    POSITIONAL TESTING: Right Dix-Hallpike: no nystagmus Left Dix-Hallpike: no nystagmus Right Roll Test: no nystagmus Left Roll Test: no nystagmus Right Sidelying: no nystagmus Left Sidelying: no nystagmus *gets spinning vertigo with return to sitting  MOTION SENSITIVITY: Not assessed  Motion Sensitivity Quotient Intensity: 0 = none, 1 = Lightheaded, 2 = Mild, 3 = Moderate, 4 = Severe, 5 = Vomiting  Intensity  1. Sitting to supine   2. Supine to L side   3. Supine to R side   4. Supine to sitting   5. L Hallpike-Dix   6. Up from L    7. R Hallpike-Dix   8. Up from R    9. Sitting, head tipped to L knee   10. Head up from L knee   11. Sitting, head tipped to R knee   12. Head up from R knee   13. Sitting head turns x5   14.Sitting head nods x5   15. In stance, 180 turn to L    16. In stance, 180 turn to R     OTHOSTATICS: TBA  [facility] Adult PT Treatment:                                                DATE: 07/28/22 Therapeutic Exercise: See initial HEP below  PATIENT EDUCATION: Education details: HEP, plan of care Person educated: Patient Education method: Explanation, Demonstration, and Handouts Education comprehension: verbalized understanding and returned demonstration  HOME EXERCISE PROGRAM: Access Code: [email or web address] URL: [email or web address] Date: 07/28/2022 Prepared by: Margretta Ditty  Exercises - Seated Gaze Stabilization with Head Rotation  - 3 x daily - 7 x weekly - 1-2 sets - 10 reps - Seated habituation head turns  - 2 x daily - 7 x weekly - 1 sets - 5 reps -  Straight Leg Raise  - 2 x daily - 7 x weekly - 1 sets - 10 reps  GOALS: Goals reviewed with patient? Yes  SHORT TERM GOALS: Target date: 08/27/22  The patient will be indep with HEP.  Baseline: initiated at eval Goal status: INITIAL  2.  The patient will improve Berg balance score to > or equal to 40/56. Baseline: 35/56 Goal status: INITIAL  3.  The patient will tolerate VOR x 1 viewing x 30 seconds. Baseline:  Stops head motion with 10 reps of VOR x 1 viewing Goal status: INITIAL  4.  The patient will report knee pain < or equal to 6/10. Baseline: 8/10 Goal status: INITIAL  LONG TERM GOALS: Target date: 09/26/22  The patient will be indep with HEP progression. Baseline:  initiated at eval Goal status: INITIAL  2.  The patient will improve Berg score to > or equal to 44/56. Baseline:  35/56 Goal status: INITIAL  3.  The patient will improve gait speed to > or equal to 2.62 ft/sec to demo improved community gait. Baseline:  2.18 ft/sec Goal status: INITIAL  4.  The patient will demo standing without high heels on (has on 2" heel today) without pulling posteriorly. Baseline:  She notes difficulty descending steps in flat shoes (anticipate tight heel cords) Goal status: INITIAL  5.  The patient will demonstrate standing head turns R<>L x 5 reps without loss of balance. Baseline:  Holds head still due to imbalance with turns. Goal status: INITIAL  ASSESSMENT:  CLINICAL IMPRESSION: Patient is a 77 y.o. female who was seen today for physical therapy evaluation and treatment for unsteadiness and frequent falls. She presents with impairments in LE strength, ankle ROM, diminished VOR (has difficulty with slow gaze x 1), imbalance, high fall risk per Berg, and knee pain. The patient has had multiple falls and one with injury in the past 6 months. She was negative today for positional testing with no nystagmus viewed in room light, however does get a spinning sensation with return to  sitting--orthostatic testing and retesting for positional vertigo may be indicated. PT to address deficits to promote dec'd falls and improve mobility.    OBJECTIVE IMPAIRMENTS: Abnormal gait, decreased activity tolerance, decreased balance, difficulty walking, decreased strength, impaired flexibility, and pain, dizziness.  ACTIVITY LIMITATIONS: squatting, stairs, and locomotion level  PARTICIPATION LIMITATIONS: community activity  PERSONAL FACTORS: 1-2 comorbidities: falls, HTN  are also affecting patient's functional outcome.   REHAB POTENTIAL: Good  CLINICAL DECISION MAKING: Stable/uncomplicated  EVALUATION COMPLEXITY: Low  PLAN:  PT FREQUENCY: 2x/week  PT DURATION: 8 weeks  PLANNED INTERVENTIONS: Therapeutic exercises, Therapeutic activity, Neuromuscular re-education, Balance training, Gait training, Patient/Family education, Self Care, Joint mobilization, Vestibular training, Canalith repositioning, Moist heat, Manual therapy, and Re-evaluation  PLAN FOR NEXT SESSION: progress gaze from 10 reps up to 30 seconds if able, move to standing with UE support for head turns. Needs gastroc stretching, standing balance training, turns in standing with support, and LE strengthening (chronic bilateral knee pain).  Retest positional vertigo and/or orthostatics. *Pt also has tightness bilateral shoulders and could incorporate UE movement into balance activities if able for functional strengthening.    Referring diagnosis?  R29.6 (ICD-10-CM) - Frequent falls   Treatment diagnosis? (if different than referring diagnosis) THERAPY DIAG:  Muscle weakness (generalized) Other abnormalities of gait and mobility Unsteadiness on feet  What was this (referring dx) caused by? []  Surgery [x]  Fall []  Ongoing issue []  Arthritis []  Other: ____________  Laterality: []  Rt []  Lt [x]  Both  Check all possible CPT codes:  *CHOOSE 10 OR LESS*    [x]  97110 (Therapeutic Exercise)  []  92507 (SLP  Treatment)  [x]  97112 (Neuro Re-ed)   []  92526 (Swallowing Treatment)   [  x] [phone] (Investment banker, operational)   []  [id number] (Cognitive Training, 1st 15 minutes) [x]  97140 (Manual Therapy)   []  97130 (Cognitive Training, each add'l 15 minutes)  [x]  97164 (Re-evaluation)                              []  Other, List CPT Code ____________  [x]  97530 (Therapeutic Activities)     [x]  97535 (Self Care)   []  All codes above (97110 - 97535)  []  97012 (Mechanical Traction)  []  97014 (E-stim Unattended)  []  97032 (E-stim manual)  []  97033 (Ionto)  []  97035 (Ultrasound) []  97750 (Physical Performance Training) []  [id number] (Aquatic Therapy) []  97016 (Vasopneumatic Device) []  [id number] (Paraffin) []  97034 (Contrast Bath) []  97597 (Wound Care 1st 20 sq cm) []  97598 (Wound Care each add'l 20 sq cm) []  97760 (Orthotic Fabrication, Fitting, Training Initial) []  [id number] (Prosthetic Management and Training Initial) []  [id number] (Orthotic or Prosthetic Training/ Modification Subsequent)   Daran Favaro, PT 07/28/2022, 4:24 PM

## 2022-07-29 ENCOUNTER — Ambulatory Visit: Payer: Medicare PPO | Admitting: Physical Therapy

## 2022-08-04 ENCOUNTER — Ambulatory Visit: Payer: Medicare PPO

## 2022-08-04 DIAGNOSIS — R2681 Unsteadiness on feet: Secondary | ICD-10-CM

## 2022-08-04 DIAGNOSIS — M6281 Muscle weakness (generalized): Secondary | ICD-10-CM

## 2022-08-04 DIAGNOSIS — R2689 Other abnormalities of gait and mobility: Secondary | ICD-10-CM

## 2022-08-04 DIAGNOSIS — M542 Cervicalgia: Secondary | ICD-10-CM

## 2022-08-04 NOTE — Therapy (Signed)
OUTPATIENT PHYSICAL THERAPY NEURO TREATMENT   Patient Name: [name] [name] MRN: [id number] DOB:[birth year], 77 y.o., female Today's Date: 08/04/2022 PCP: Karie Georges, MD REFERRING PROVIDER: Karie Georges, MD  END OF SESSION:  PT End of Session - 08/04/22 1449     Visit Number 2    Number of Visits 16    Date for PT Re-Evaluation 09/26/22    Authorization Type humana medicare    PT Start Time 1445    PT Stop Time 1530    PT Time Calculation (min) 45 min    Activity Tolerance Patient tolerated treatment well    Behavior During Therapy [facility] for tasks assessed/performed            Past Medical History:  Diagnosis Date   Aorto-iliac atherosclerosis (HCC)    last aaa duplex 08-30-2015  >50% stenosis bilateral common iliac arteries and left external iliac artery   Arrhythmia    Carotid stenosis, bilateral    cardiologist-- dr Jens Som (LOV 08-06-2015)  04-29-2017 followed by pcp/  last duplex 03-08-2017 bilateral ICA 40-59% post endarterectomy,  right ECA occluded   Cerebral vascular disease    Chronic gastritis    Elevated cholesterol    Esophageal stricture    Full dentures    GERD (gastroesophageal reflux disease)    Hiatal hernia    History of aortic valve disorder    per cardiology note dated 08-06-2015 by dr Jens Som pt has hx small fibroblastoma aortic valve per echo 2008 /  last echo 08-23-2015 no fibroblastoma noted or stenosis or thickened/ calcification   History of gastric ulcer 2013   History of nuclear stress test 06-17-2006   per dr Jens Som note dated 08-06-2015 , normal study   History of transient ischemic attack (TIA) 04/25/2006   severe left ICA stenosis/   04-29-2017 per pt no residual   HLD (hyperlipidemia)    HTN (hypertension)    IBS (irritable bowel syndrome)    OA (osteoarthritis)    Pneumonia    Prolapsed internal hemorrhoids, grade 3    PVD (peripheral vascular disease) (HCC)    Rectal bleed    SYMPTOM, ECCHYMOSES, SPONTANEOUS  06/16/2006   Qualifier: Diagnosis of  By: Cato Mulligan MD, Bruce     Vertigo    Wears contact lenses    Past Surgical History:  Procedure Laterality Date   CAROTID ENDARTERECTOMY Bilateral left 04-29-2006 and right 08-17-2007 by  dr c. [name]  [facility]   HEMORRHOID SURGERY N/A 05/07/2017   Procedure: ANAL EXAM UNDER ANESTHESIA, HEMORRHOIDECTOMY;  Surgeon: Romie Levee, MD;  Location: [facility];  Service: General;  Laterality: N/A;   NASAL SINUS SURGERY  age 62   TONSILLECTOMY  age 70   TRANSTHORACIC ECHOCARDIOGRAM  08/23/2015   ef 60-65%, grade 1 diastolic dysfunction/ mild AR and MR   VASCULAR SURGERY Bilateral    carotids   WRIST GANGLION EXCISION Right yrs ago   Patient Active Problem List   Diagnosis Date Noted   Prediabetes 12/23/2021   Statin myopathy 12/23/2021   CAROTID BRUIT 11/21/2008   HYPERCHOLESTEROLEMIA 11/16/2008   Osteoarthritis 11/16/2008   Hyperlipemia 06/16/2006   Essential hypertension 06/16/2006   Cerebrovascular disease 06/16/2006   GERD 06/16/2006    ONSET DATE: 06/01/2022 REFERRING DIAG:  Diagnosis  R29.6 (ICD-10-CM) - Frequent falls   THERAPY DIAG:  Muscle weakness (generalized)  Other abnormalities of gait and mobility  Unsteadiness on feet  Cervicalgia  Rationale for Evaluation and Treatment: Rehabilitation  SUBJECTIVE:  SUBJECTIVE STATEMENT: No falls, no dizziness to speak of. Had episode this AM that caused imbalance with fast head turns Pt accompanied by: self  PERTINENT HISTORY: cerebrovascular disease, hyperlipidemia, HTN, OA, hypercholesterolemia  PAIN:  Are you having pain? Yes: NPRS scale: 8/10 Pain location: both knees Pain description: bruised sensation  Aggravating factors: walking in stores, walking longer distances Relieving  factors: unsure, arthritic pain  PRECAUTIONS: Fall  WEIGHT BEARING RESTRICTIONS: No  FALLS: Has patient fallen in last 6 months? Yes. Number of falls 5  4 minor falls, 1 with injury  LIVING ENVIRONMENT: Lives with: lives alone Lives in: House/apartment Stairs: Yes: Internal: 4-5 steps; one side and External: 8 steps; one rail-- steep steps Has following equipment at home: None  PLOF: Independent  PATIENT GOALS: reduce falls  OBJECTIVE:   TODAY'S TREATMENT: 08/04/22 Activity Comments  Reports 0/10 baseline No HA, no dizziness, no fogginess, nausea  Seated VOR 2x30 sec Decreased speed needed  Left head turns to target 2x5 reps Incr symptoms to 4-5/10  Corner balance activities See HEP  Gastroc stretch off edge of step 2x60 sec       PATIENT EDUCATION: Education details: HEP, plan of care Person educated: Patient Education method: Explanation, Demonstration, and Handouts Education comprehension: verbalized understanding and returned demonstration  HOME EXERCISE PROGRAM: Access Code: [email or web address] URL: [email or web address] Date: 07/28/2022 Prepared by: Margretta Ditty  Exercises - Seated Gaze Stabilization with Head Rotation  - 3 x daily - 7 x weekly - 1-2 sets - 10 reps - Seated habituation head turns  - 2 x daily - 7 x weekly - 1 sets - 5 reps - Straight Leg Raise  - 2 x daily - 7 x weekly - 1 sets - 10 reps - Corner Balance Feet Together With Eyes Open  - 1 x daily - 7 x weekly - 3 sets - 30 hold - Corner Balance Feet Together With Eyes Closed  - 1 x daily - 7 x weekly - 3 sets - 30 sec hold - Corner Balance Feet Together: Eyes Open With Head Turns  - 1 x daily - 7 x weekly - 3 sets - 3 reps - Corner Balance Feet Together: Eyes Closed With Head Turns  - 1 x daily - 7 x weekly - 3 sets - 3 reps - Standing Gastroc Stretch on Step with Counter Support  - 1 x daily - 7 x weekly - 3 sets - 30 sec hold  COGNITION: Overall cognitive status: Within functional  limits for tasks assessed   SENSATION: WFL  POSTURE: No Significant postural limitations  UPPER EXTREMITY ROM: dec'd shoulder flexion to 95 degrees bilaterally, can reach behind her back with the left arm, but not the right arm. She has tightness in her neck and shoulders   LOWER EXTREMITY AROM: WFLs, tightness in gastrocs due to shoewear (wears dress shoes)  LOWER EXTREMITY MMT   MMT Right Eval Left Eval  Hip flexion 4/5 4/5  Hip extension    Hip abduction    Hip adduction    Hip internal rotation    Hip external rotation    Knee flexion 4/5 4/5  Knee extension 4/5 4/5  Ankle dorsiflexion 4/5 4/5  Ankle plantarflexion    Ankle inversion    Ankle eversion     (Blank rows = not tested)  BED MOBILITY:  independent  TRANSFERS: Sit to stand: Modified independence Stand to sit: Modified independence *uses hands  STAIRS: Level of Assistance: Modified independence Stair Negotiation Technique: Alternating  Pattern  with Bilateral Rails Number of Stairs: 3   Comments: slowed pace, hesitates with steps descending  GAIT: Gait pattern:  had imbalance during gait noted with multiple lateral steps to correct for loss of balance Distance walked: 150 ft Assistive device utilized: None Level of assistance: Modified independence Comments: slowed pace Gait speed=2.18 ft/sec   Berg=35/56   PATIENT SURVEYS:  None indicated  VESTIBULAR ASSESSMENT: SYMPTOM BEHAVIOR:  Subjective history: patient notes difficulty with turns and looking up  Non-Vestibular symptoms:  unable to tolerate turns  Type of dizziness: Unsteady with head/body turns  Frequency: daily  Duration: with movement  Aggravating factors: Induced by motion: turning head quickly  Relieving factors: head stationary  Progression of symptoms: unchanged  OCULOMOTOR EXAM:  Ocular Alignment: normal  Ocular ROM: No Limitations  Spontaneous Nystagmus: absent  Gaze-Induced Nystagmus: absent  Smooth Pursuits:  intact   VESTIBULAR - OCULAR REFLEX:   Slow VOR: Comment: gets sensation of visual movement and starts slowing down after 3 reps    POSITIONAL TESTING: Right Dix-Hallpike: no nystagmus Left Dix-Hallpike: no nystagmus Right Roll Test: no nystagmus Left Roll Test: no nystagmus Right Sidelying: no nystagmus Left Sidelying: no nystagmus *gets spinning vertigo with return to sitting  MOTION SENSITIVITY: Not assessed  Motion Sensitivity Quotient Intensity: 0 = none, 1 = Lightheaded, 2 = Mild, 3 = Moderate, 4 = Severe, 5 = Vomiting  Intensity  1. Sitting to supine   2. Supine to L side   3. Supine to R side   4. Supine to sitting   5. L Hallpike-Dix   6. Up from L    7. R Hallpike-Dix   8. Up from R    9. Sitting, head tipped to L knee   10. Head up from L knee   11. Sitting, head tipped to R knee   12. Head up from R knee   13. Sitting head turns x5   14.Sitting head nods x5   15. In stance, 180 turn to L    16. In stance, 180 turn to R     OTHOSTATICS: TBA   GOALS: Goals reviewed with patient? Yes  SHORT TERM GOALS: Target date: 08/27/22  The patient will be indep with HEP. Baseline: initiated at eval Goal status: INITIAL  2.  The patient will improve Berg balance score to > or equal to 40/56. Baseline: 35/56 Goal status: INITIAL  3.  The patient will tolerate VOR x 1 viewing x 30 seconds. Baseline:  Stops head motion with 10 reps of VOR x 1 viewing Goal status: INITIAL  4.  The patient will report knee pain < or equal to 6/10. Baseline: 8/10 Goal status: INITIAL  LONG TERM GOALS: Target date: 09/26/22  The patient will be indep with HEP progression. Baseline:  initiated at eval Goal status: INITIAL  2.  The patient will improve Berg score to > or equal to 44/56. Baseline:  35/56 Goal status: INITIAL  3.  The patient will improve gait speed to > or equal to 2.62 ft/sec to demo improved community gait. Baseline:  2.18 ft/sec Goal status: INITIAL  4.   The patient will demo standing without high heels on (has on 2" heel today) without pulling posteriorly. Baseline:  She notes difficulty descending steps in flat shoes (anticipate tight heel cords) Goal status: INITIAL  5.  The patient will demonstrate standing head turns R<>L x 5 reps without loss of balance. Baseline:  Holds head still due to imbalance with  turns. Goal status: INITIAL  ASSESSMENT:  CLINICAL IMPRESSION: Initiated with review of initial HEP and demonstrates reduced tolerance to VOR activities with trials of 30 sec and notes dificulty in maintaining gaze stability after 15-20 sec before error and requiring stop and reset.  Continued with multi-sensory balance activities to add to HEP to improve postural stability and emphasis on incorporating head movements for greater challenge which were tolerated well despite increased sway once head movements were initiated.  Training in flexibility for gastroc to improve dorsiflexion ROM. Continued sessions to advance POC details to address balance, vestibular, and MSK deficits  OBJECTIVE IMPAIRMENTS: Abnormal gait, decreased activity tolerance, decreased balance, difficulty walking, decreased strength, impaired flexibility, and pain, dizziness.  ACTIVITY LIMITATIONS: squatting, stairs, and locomotion level  PARTICIPATION LIMITATIONS: community activity  PERSONAL FACTORS: 1-2 comorbidities: falls, HTN  are also affecting patient's functional outcome.   REHAB POTENTIAL: Good  CLINICAL DECISION MAKING: Stable/uncomplicated  EVALUATION COMPLEXITY: Low  PLAN:  PT FREQUENCY: 2x/week  PT DURATION: 8 weeks  PLANNED INTERVENTIONS: Therapeutic exercises, Therapeutic activity, Neuromuscular re-education, Balance training, Gait training, Patient/Family education, Self Care, Joint mobilization, Vestibular training, Canalith repositioning, Moist heat, Manual therapy, and Re-evaluation  PLAN FOR NEXT SESSION: Progress VOR as able, HEP  review, additional LE flexibility/strength, dynamic balance, head/body turns      3:40 PM, 08/04/22 M. Shary Decamp, PT, DPT Physical Therapist- [place] Office Number: [phone]

## 2022-08-07 ENCOUNTER — Ambulatory Visit: Payer: Medicare PPO

## 2022-08-07 DIAGNOSIS — M542 Cervicalgia: Secondary | ICD-10-CM

## 2022-08-07 DIAGNOSIS — M6281 Muscle weakness (generalized): Secondary | ICD-10-CM

## 2022-08-07 DIAGNOSIS — R2681 Unsteadiness on feet: Secondary | ICD-10-CM

## 2022-08-07 DIAGNOSIS — R2689 Other abnormalities of gait and mobility: Secondary | ICD-10-CM

## 2022-08-07 NOTE — Therapy (Signed)
OUTPATIENT PHYSICAL THERAPY NEURO TREATMENT   Patient Name: [name] [name] MRN: [id number] DOB:1945-02-04, 77 y.o., female Today's Date: 08/07/2022 PCP: Karie Georges, MD REFERRING PROVIDER: Karie Georges, MD  END OF SESSION:  PT End of Session - 08/07/22 1013     Visit Number 3    Number of Visits 16    Date for PT Re-Evaluation 09/26/22    Authorization Type humana medicare    Authorization Time Period 16 visits through 09/26/22    PT Start Time 1015    PT Stop Time 1100    PT Time Calculation (min) 45 min    Activity Tolerance Patient tolerated treatment well    Behavior During Therapy [facility] for tasks assessed/performed            Past Medical History:  Diagnosis Date   Aorto-iliac atherosclerosis (HCC)    last aaa duplex 08-30-2015  >50% stenosis bilateral common iliac arteries and left external iliac artery   Arrhythmia    Carotid stenosis, bilateral    cardiologist-- dr Jens Som (LOV 08-06-2015)  04-29-2017 followed by pcp/  last duplex 03-08-2017 bilateral ICA 40-59% post endarterectomy,  right ECA occluded   Cerebral vascular disease    Chronic gastritis    Elevated cholesterol    Esophageal stricture    Full dentures    GERD (gastroesophageal reflux disease)    Hiatal hernia    History of aortic valve disorder    per cardiology note dated 08-06-2015 by dr Jens Som pt has hx small fibroblastoma aortic valve per echo 2008 /  last echo 08-23-2015 no fibroblastoma noted or stenosis or thickened/ calcification   History of gastric ulcer 2013   History of nuclear stress test 06-17-2006   per dr Jens Som note dated 08-06-2015 , normal study   History of transient ischemic attack (TIA) 04/25/2006   severe left ICA stenosis/   04-29-2017 per pt no residual   HLD (hyperlipidemia)    HTN (hypertension)    IBS (irritable bowel syndrome)    OA (osteoarthritis)    Pneumonia    Prolapsed internal hemorrhoids, grade 3    PVD (peripheral vascular disease) (HCC)     Rectal bleed    SYMPTOM, ECCHYMOSES, SPONTANEOUS 06/16/2006   Qualifier: Diagnosis of  By: Cato Mulligan MD, Bruce     Vertigo    Wears contact lenses    Past Surgical History:  Procedure Laterality Date   CAROTID ENDARTERECTOMY Bilateral left 04-29-2006 and right 08-17-2007 by  dr c. [name]  [facility]   HEMORRHOID SURGERY N/A 05/07/2017   Procedure: ANAL EXAM UNDER ANESTHESIA, HEMORRHOIDECTOMY;  Surgeon: Romie Levee, MD;  Location: [facility];  Service: General;  Laterality: N/A;   NASAL SINUS SURGERY  age 42   TONSILLECTOMY  age 23   TRANSTHORACIC ECHOCARDIOGRAM  08/23/2015   ef 60-65%, grade 1 diastolic dysfunction/ mild AR and MR   VASCULAR SURGERY Bilateral    carotids   WRIST GANGLION EXCISION Right yrs ago   Patient Active Problem List   Diagnosis Date Noted   Prediabetes 12/23/2021   Statin myopathy 12/23/2021   CAROTID BRUIT 11/21/2008   HYPERCHOLESTEROLEMIA 11/16/2008   Osteoarthritis 11/16/2008   Hyperlipemia 06/16/2006   Essential hypertension 06/16/2006   Cerebrovascular disease 06/16/2006   GERD 06/16/2006    ONSET DATE: 06/01/2022 REFERRING DIAG:  Diagnosis  R29.6 (ICD-10-CM) - Frequent falls   THERAPY DIAG:  Muscle weakness (generalized)  Other abnormalities of gait and mobility  Unsteadiness on feet  Cervicalgia  Rationale  for Evaluation and Treatment: Rehabilitation  SUBJECTIVE:                                                                                                                                                                                        SUBJECTIVE STATEMENT: No falls, no dizziness to speak of. Had episode this AM that caused imbalance with fast head turns Pt accompanied by: self  PERTINENT HISTORY: cerebrovascular disease, hyperlipidemia, HTN, OA, hypercholesterolemia  PAIN:  Are you having pain? Yes: NPRS scale: 8/10 Pain location: both knees Pain description: bruised sensation  Aggravating factors: walking in  stores, walking longer distances Relieving factors: unsure, arthritic pain  PRECAUTIONS: Fall  WEIGHT BEARING RESTRICTIONS: No  FALLS: Has patient fallen in last 6 months? Yes. Number of falls 5  4 minor falls, 1 with injury  LIVING ENVIRONMENT: Lives with: lives alone Lives in: House/apartment Stairs: Yes: Internal: 4-5 steps; one side and External: 8 steps; one rail-- steep steps Has following equipment at home: None  PLOF: Independent  PATIENT GOALS: reduce falls  OBJECTIVE:   TODAY'S TREATMENT: 08/07/22 Activity Comments  Standing VOR 2x30 sec, increased postural sway--no report of head symptoms -repeated in sitting no issues--slower speed.  Additional trial with 100 bpm, decreased stability -standing VOR at 100 bpm x 30 sec--no head symptoms, postural sway  Left head turns to target 2x5   Cervical neck torsion test - neck pain on right w/ right body rotation, no HA, nausea, dizziness, fogginess. No issue with left rotation other than neck pain  Pt education Regarding intervention rationale and specific adaptations to imposed demands             PATIENT EDUCATION: Education details: HEP, plan of care Person educated: Patient Education method: Explanation, Demonstration, and Handouts Education comprehension: verbalized understanding and returned demonstration  HOME EXERCISE PROGRAM: Access Code: [email or web address] URL: [email or web address] Date: 07/28/2022 Prepared by: Margretta Ditty  Exercises - Seated Gaze Stabilization with Head Rotation  - 3 x daily - 7 x weekly - 1-2 sets - 10 reps - Seated habituation head turns  - 2 x daily - 7 x weekly - 1 sets - 5 reps - Straight Leg Raise  - 2 x daily - 7 x weekly - 1 sets - 10 reps - Corner Balance Feet Together With Eyes Open  - 1 x daily - 7 x weekly - 3 sets - 30 hold - Corner Balance Feet Together With Eyes Closed  - 1 x daily - 7 x weekly - 3 sets - 30 sec hold - Corner Balance Feet Together: Eyes Open With  Head Turns  - 1 x daily - 7 x  weekly - 3 sets - 3 reps - Corner Balance Feet Together: Eyes Closed With Head Turns  - 1 x daily - 7 x weekly - 3 sets - 3 reps - Standing Gastroc Stretch on Step with Counter Support  - 1 x daily - 7 x weekly - 3 sets - 30 sec hold  COGNITION: Overall cognitive status: Within functional limits for tasks assessed   SENSATION: WFL  POSTURE: No Significant postural limitations  UPPER EXTREMITY ROM: dec'd shoulder flexion to 95 degrees bilaterally, can reach behind her back with the left arm, but not the right arm. She has tightness in her neck and shoulders   LOWER EXTREMITY AROM: WFLs, tightness in gastrocs due to shoewear (wears dress shoes)  LOWER EXTREMITY MMT   MMT Right Eval Left Eval  Hip flexion 4/5 4/5  Hip extension    Hip abduction    Hip adduction    Hip internal rotation    Hip external rotation    Knee flexion 4/5 4/5  Knee extension 4/5 4/5  Ankle dorsiflexion 4/5 4/5  Ankle plantarflexion    Ankle inversion    Ankle eversion     (Blank rows = not tested)  BED MOBILITY:  independent  TRANSFERS: Sit to stand: Modified independence Stand to sit: Modified independence *uses hands  STAIRS: Level of Assistance: Modified independence Stair Negotiation Technique: Alternating Pattern  with Bilateral Rails Number of Stairs: 3   Comments: slowed pace, hesitates with steps descending  GAIT: Gait pattern:  had imbalance during gait noted with multiple lateral steps to correct for loss of balance Distance walked: 150 ft Assistive device utilized: None Level of assistance: Modified independence Comments: slowed pace Gait speed=2.18 ft/sec   Berg=35/56   PATIENT SURVEYS:  None indicated  VESTIBULAR ASSESSMENT: SYMPTOM BEHAVIOR:  Subjective history: patient notes difficulty with turns and looking up  Non-Vestibular symptoms:  unable to tolerate turns  Type of dizziness: Unsteady with head/body turns  Frequency:  daily  Duration: with movement  Aggravating factors: Induced by motion: turning head quickly  Relieving factors: head stationary  Progression of symptoms: unchanged  OCULOMOTOR EXAM:  Ocular Alignment: normal  Ocular ROM: No Limitations  Spontaneous Nystagmus: absent  Gaze-Induced Nystagmus: absent  Smooth Pursuits: intact   VESTIBULAR - OCULAR REFLEX:   Slow VOR: Comment: gets sensation of visual movement and starts slowing down after 3 reps    POSITIONAL TESTING: Right Dix-Hallpike: no nystagmus Left Dix-Hallpike: no nystagmus Right Roll Test: no nystagmus Left Roll Test: no nystagmus Right Sidelying: no nystagmus Left Sidelying: no nystagmus *gets spinning vertigo with return to sitting  MOTION SENSITIVITY: Not assessed  Motion Sensitivity Quotient Intensity: 0 = none, 1 = Lightheaded, 2 = Mild, 3 = Moderate, 4 = Severe, 5 = Vomiting  Intensity  1. Sitting to supine   2. Supine to L side   3. Supine to R side   4. Supine to sitting   5. L Hallpike-Dix   6. Up from L    7. R Hallpike-Dix   8. Up from R    9. Sitting, head tipped to L knee   10. Head up from L knee   11. Sitting, head tipped to R knee   12. Head up from R knee   13. Sitting head turns x5   14.Sitting head nods x5   15. In stance, 180 turn to L    16. In stance, 180 turn to R     OTHOSTATICS: TBA  GOALS: Goals reviewed with patient? Yes  SHORT TERM GOALS: Target date: 08/27/22  The patient will be indep with HEP. Baseline: initiated at eval Goal status: IN PROGRESS  2.  The patient will improve Berg balance score to > or equal to 40/56. Baseline: 35/56 Goal status: IN PROGRESS  3.  The patient will tolerate VOR x 1 viewing x 30 seconds. Baseline:  Stops head motion with 10 reps of VOR x 1 viewing Goal status: IN PROGRESS  4.  The patient will report knee pain < or equal to 6/10. Baseline: 8/10 Goal status: IN PROGRESS  LONG TERM GOALS: Target date: 09/26/22  The patient will be  indep with HEP progression. Baseline:  initiated at eval Goal status: INITIAL  2.  The patient will improve Berg score to > or equal to 44/56. Baseline:  35/56 Goal status: INITIAL  3.  The patient will improve gait speed to > or equal to 2.62 ft/sec to demo improved community gait. Baseline:  2.18 ft/sec Goal status: INITIAL  4.  The patient will demo standing without high heels on (has on 2" heel today) without pulling posteriorly. Baseline:  She notes difficulty descending steps in flat shoes (anticipate tight heel cords) Goal status: INITIAL  5.  The patient will demonstrate standing head turns R<>L x 5 reps without loss of balance. Baseline:  Holds head still due to imbalance with turns. Goal status: INITIAL  ASSESSMENT:  CLINICAL IMPRESSION: Initiated activities with standing VOR and cues for increased pace to 100 bpm.  Demonstrates increased postural sway during activity but denies any issues of HA, dizziness, nausea, fogginess.  Cervical torsion test performed with pt seated on swivel stool and no provocation other than right side neck discomfort with end-range position.  Patient reports her multiple orthopedic issues (shoulders/knees) are most problematic as she is unable to lift items such as 2 gallon water jug.  Will incorporate additional cervical spine and shoulder ROM/strength activities as tolerated  OBJECTIVE IMPAIRMENTS: Abnormal gait, decreased activity tolerance, decreased balance, difficulty walking, decreased strength, impaired flexibility, and pain, dizziness.  ACTIVITY LIMITATIONS: squatting, stairs, and locomotion level  PARTICIPATION LIMITATIONS: community activity  PERSONAL FACTORS: 1-2 comorbidities: falls, HTN  are also affecting patient's functional outcome.   REHAB POTENTIAL: Good  CLINICAL DECISION MAKING: Stable/uncomplicated  EVALUATION COMPLEXITY: Low  PLAN:  PT FREQUENCY: 2x/week  PT DURATION: 8 weeks  PLANNED INTERVENTIONS: Therapeutic  exercises, Therapeutic activity, Neuromuscular re-education, Balance training, Gait training, Patient/Family education, Self Care, Joint mobilization, Vestibular training, Canalith repositioning, Moist heat, Manual therapy, and Re-evaluation  PLAN FOR NEXT SESSION: Progress VOR as able, HEP review, additional LE flexibility/strength, dynamic balance, head/body turns. Difficulty with lifting 2 gal jug from floor due to shoulder pain/weakness, incorporate c-spine ROM and shoulder strength      10:14 AM, 08/07/22 M. Shary Decamp, PT, DPT Physical Therapist- [place] Office Number: [phone]

## 2022-08-11 ENCOUNTER — Encounter: Payer: Self-pay | Admitting: Rehabilitative and Restorative Service Providers"

## 2022-08-11 ENCOUNTER — Ambulatory Visit: Payer: Medicare PPO | Admitting: Rehabilitative and Restorative Service Providers"

## 2022-08-11 DIAGNOSIS — R2681 Unsteadiness on feet: Secondary | ICD-10-CM

## 2022-08-11 DIAGNOSIS — M6281 Muscle weakness (generalized): Secondary | ICD-10-CM | POA: Diagnosis not present

## 2022-08-11 DIAGNOSIS — R2689 Other abnormalities of gait and mobility: Secondary | ICD-10-CM

## 2022-08-11 NOTE — Therapy (Signed)
OUTPATIENT PHYSICAL THERAPY NEURO TREATMENT   Patient Name: [name] [name] MRN: [id number] DOB:1945-08-13, 77 y.o., female Today's Date: 08/11/2022 PCP: Karie Georges, MD REFERRING PROVIDER: Karie Georges, MD  END OF SESSION:  PT End of Session - 08/11/22 1022     Visit Number 4    Number of Visits 16    Date for PT Re-Evaluation 09/26/22    Authorization Type human[employer] medicare    Authorization Time Period 16 visits through 09/26/22    PT Start Time 1022    PT Stop Time 1102    PT Time Calculation (min) 40 min    Activity Tolerance Patient tolerated treatment well    Behavior During Therapy [facility] for tasks assessed/performed             Past Medical History:  Diagnosis Date   Aorto-iliac atherosclerosis (HCC)    last aaa duplex 08-30-2015  >50% stenosis bilateral common iliac arteries and left external iliac artery   Arrhythmia    Carotid stenosis, bilateral    cardiologist-- dr Jens Som (LOV 08-06-2015)  04-29-2017 followed by pcp/  last duplex 03-08-2017 bilateral ICA 40-59% post endarterectomy,  right ECA occluded   Cerebral vascular disease    Chronic gastritis    Elevated cholesterol    Esophageal stricture    Full dentures    GERD (gastroesophageal reflux disease)    Hiatal hernia    History of aortic valve disorder    per cardiology note dated 08-06-2015 by dr Jens Som pt has hx small fibroblastoma aortic valve per echo 2008 /  last echo 08-23-2015 no fibroblastoma noted or stenosis or thickened/ calcification   History of gastric ulcer 2013   History of nuclear stress test 06-17-2006   per dr Jens Som note dated 08-06-2015 , normal study   History of transient ischemic attack (TIA) 04/25/2006   severe left ICA stenosis/   04-29-2017 per pt no residual   HLD (hyperlipidemia)    HTN (hypertension)    IBS (irritable bowel syndrome)    OA (osteoarthritis)    Pneumonia    Prolapsed internal hemorrhoids, grade 3    PVD (peripheral vascular disease) (HCC)     Rectal bleed    SYMPTOM, ECCHYMOSES, SPONTANEOUS 06/16/2006   Qualifier: Diagnosis of  By: Cato Mulligan MD, Bruce     Vertigo    Wears contact lenses    Past Surgical History:  Procedure Laterality Date   CAROTID ENDARTERECTOMY Bilateral left 04-29-2006 and right 08-17-2007 by  dr c. [name]  [facility]   HEMORRHOID SURGERY N/A 05/07/2017   Procedure: ANAL EXAM UNDER ANESTHESIA, HEMORRHOIDECTOMY;  Surgeon: Romie Levee, MD;  Location: [facility] [place];  Service: General;  Laterality: N/A;   NASAL SINUS SURGERY  age 59   TONSILLECTOMY  age 38   TRANSTHORACIC ECHOCARDIOGRAM  08/23/2015   ef 60-65%, grade 1 diastolic dysfunction/ mild AR and MR   VASCULAR SURGERY Bilateral    carotids   WRIST GANGLION EXCISION Right yrs ago   Patient Active Problem List   Diagnosis Date Noted   Prediabetes 12/23/2021   Statin myopathy 12/23/2021   CAROTID BRUIT 11/21/2008   HYPERCHOLESTEROLEMIA 11/16/2008   Osteoarthritis 11/16/2008   Hyperlipemia 06/16/2006   Essential hypertension 06/16/2006   Cerebrovascular disease 06/16/2006   GERD 06/16/2006    ONSET DATE: 06/01/2022 REFERRING DIAG:  Diagnosis  R29.6 (ICD-10-CM) - Frequent falls   THERAPY DIAG:  Muscle weakness (generalized)  Other abnormalities of gait and mobility  Unsteadiness on feet  Rationale for  Evaluation and Treatment: Rehabilitation  SUBJECTIVE:                                                                                                                                                                                        SUBJECTIVE STATEMENT: Patient is doing initial HEP-- doesn't have written copy of new, updated HEP. Pt accompanied by: self  PERTINENT HISTORY: cerebrovascular disease, hyperlipidemia, HTN, OA, hypercholesterolemia  PAIN:  Are you having pain? Yes: NPRS scale: 8/10 Pain location: both knees Pain description: bruised sensation  Aggravating factors: walking in stores, walking longer  distances Relieving factors: unsure, arthritic pain  PRECAUTIONS: Fall  WEIGHT BEARING RESTRICTIONS: No  FALLS: Has patient fallen in last 6 months? Yes. Number of falls 5  4 minor falls, 1 with injury  LIVING ENVIRONMENT: Lives with: lives alone Lives in: House/apartment Stairs: Yes: Internal: 4-5 steps; one side and External: 8 steps; one rail-- steep steps Has following equipment at home: None  PLOF: Independent  PATIENT GOALS: reduce falls  OBJECTIVE:  [facility] Adult PT Treatment:                                                DATE: 08/11/22 Therapeutic Exercise: Supine SLR with 2# R and L x 10 reps  Bridges x 10 reps bilat LEs Standing Heel raises x 12 reps Toe raises during wall lean x 8 reps -- stopped due to knee pain Seated HS stretch Sit <> Stand x 6 reps Manual Therapy: STM R IT Band and taught patient to use muscle rolling stick (pastry roller) for home) Neuromuscular re-ed: Standing VOR Horizontal gaze x 1 viewing with cues on ROM and speed Vertical gaze x 1 viewing with cues on ROM Wall bumps With cues on hip strategy and weight shift Habituation Horizontal head turns with visual targets Turning 180 degrees x 5 reps with visual targets with SBA Marching in place   TODAY'S TREATMENT: 08/07/22 Activity Comments  Standing VOR 2x30 sec, increased postural sway--no report of head symptoms -repeated in sitting no issues--slower speed.  Additional trial with 100 bpm, decreased stability -standing VOR at 100 bpm x 30 sec--no head symptoms, postural sway  Left head turns to target 2x5   Cervical neck torsion test - neck pain on right w/ right body rotation, no HA, nausea, dizziness, fogginess. No issue with left rotation other than neck pain  Pt education Regarding intervention rationale and specific adaptations to imposed demands  PATIENT EDUCATION: Education details: HEP, plan of care Person educated: Patient Education method: Explanation,  Demonstration, and Handouts Education comprehension: verbalized understanding and returned demonstration  HOME EXERCISE PROGRAM: Access Code: [email or web address] URL: [email or web address] Date: 08/11/2022 Prepared by: Margretta Ditty  Exercises - Seated Gaze Stabilization with Head Rotation  - 3 x daily - 7 x weekly - 1-2 sets - 10 reps - Seated habituation head turns  - 2 x daily - 7 x weekly - 1 sets - 5 reps - Straight Leg Raise  - 2 x daily - 7 x weekly - 1 sets - 10 reps - Corner Balance Feet Together With Eyes Open  - 1 x daily - 7 x weekly - 3 sets - 30 hold - Corner Balance Feet Together With Eyes Closed  - 1 x daily - 7 x weekly - 3 sets - 30 sec hold - Corner Balance Feet Together: Eyes Open With Head Turns  - 1 x daily - 7 x weekly - 3 sets - 3 reps - Corner Balance Feet Together: Eyes Closed With Head Turns  - 1 x daily - 7 x weekly - 3 sets - 3 reps - Standing Gastroc Stretch on Step with Counter Support  - 1 x daily - 7 x weekly - 3 sets - 30 sec hold - Seated Hamstring Stretch with Chair  - 2 x daily - 7 x weekly - 1 sets - 10 reps _____________________________________________________________________________________________________________________ (Measures in this section from initial evaluation unless otherwise noted) COGNITION: Overall cognitive status: Within functional limits for tasks assessed   SENSATION: WFL  POSTURE: No Significant postural limitations  UPPER EXTREMITY ROM: dec'd shoulder flexion to 95 degrees bilaterally, can reach behind her back with the left arm, but not the right arm. She has tightness in her neck and shoulders   LOWER EXTREMITY AROM: WFLs, tightness in gastrocs due to shoewear (wears dress shoes)  LOWER EXTREMITY MMT   MMT Right Eval Left Eval  Hip flexion 4/5 4/5  Hip extension    Hip abduction    Hip adduction    Hip internal rotation    Hip external rotation    Knee flexion 4/5 4/5  Knee extension 4/5 4/5  Ankle  dorsiflexion 4/5 4/5  Ankle plantarflexion    Ankle inversion    Ankle eversion     (Blank rows = not tested)  BED MOBILITY:  independent  TRANSFERS: Sit to stand: Modified independence Stand to sit: Modified independence *uses hands  STAIRS: Level of Assistance: Modified independence Stair Negotiation Technique: Alternating Pattern  with Bilateral Rails Number of Stairs: 3   Comments: slowed pace, hesitates with steps descending  GAIT: Gait pattern:  had imbalance during gait noted with multiple lateral steps to correct for loss of balance Distance walked: 150 ft Assistive device utilized: None Level of assistance: Modified independence Comments: slowed pace Gait speed=2.18 ft/sec  Berg=35/56  PATIENT SURVEYS:  None indicated  VESTIBULAR ASSESSMENT: SYMPTOM BEHAVIOR:  Subjective history: patient notes difficulty with turns and looking up  Non-Vestibular symptoms:  unable to tolerate turns  Type of dizziness: Unsteady with head/body turns  Frequency: daily  Duration: with movement  Aggravating factors: Induced by motion: turning head quickly  Relieving factors: head stationary  Progression of symptoms: unchanged  OCULOMOTOR EXAM:  Ocular Alignment: normal  Ocular ROM: No Limitations  Spontaneous Nystagmus: absent  Gaze-Induced Nystagmus: absent  Smooth Pursuits: intact   VESTIBULAR - OCULAR REFLEX:   Slow VOR: Comment: gets sensation of visual movement and  starts slowing down after 3 reps    POSITIONAL TESTING: Right Dix-Hallpike: no nystagmus Left Dix-Hallpike: no nystagmus Right Roll Test: no nystagmus Left Roll Test: no nystagmus Right Sidelying: no nystagmus Left Sidelying: no nystagmus *gets spinning vertigo with return to sitting  MOTION SENSITIVITY: Not assessed  Motion Sensitivity Quotient Intensity: 0 = none, 1 = Lightheaded, 2 = Mild, 3 = Moderate, 4 = Severe, 5 = Vomiting  Intensity  1. Sitting to supine   2. Supine to L side   3. Supine  to R side   4. Supine to sitting   5. L Hallpike-Dix   6. Up from L    7. R Hallpike-Dix   8. Up from R    9. Sitting, head tipped to L knee   10. Head up from L knee   11. Sitting, head tipped to R knee   12. Head up from R knee   13. Sitting head turns x5   14.Sitting head nods x5   15. In stance, 180 turn to L    16. In stance, 180 turn to R     OTHOSTATICS: TBA   GOALS: Goals reviewed with patient? Yes  SHORT TERM GOALS: Target date: 08/27/22  The patient will be indep with HEP. Baseline: initiated at eval Goal status: IN PROGRESS  2.  The patient will improve Berg balance score to > or equal to 40/56. Baseline: 35/56 Goal status: IN PROGRESS  3.  The patient will tolerate VOR x 1 viewing x 30 seconds. Baseline:  Stops head motion with 10 reps of VOR x 1 viewing Goal status: IN PROGRESS  4.  The patient will report knee pain < or equal to 6/10. Baseline: 8/10 Goal status: IN PROGRESS  LONG TERM GOALS: Target date: 09/26/22  The patient will be indep with HEP progression. Baseline:  initiated at eval Goal status: INITIAL  2.  The patient will improve Berg score to > or equal to 44/56. Baseline:  35/56 Goal status: INITIAL  3.  The patient will improve gait speed to > or equal to 2.62 ft/sec to demo improved community gait. Baseline:  2.18 ft/sec Goal status: INITIAL  4.  The patient will demo standing without high heels on (has on 2" heel today) without pulling posteriorly. Baseline:  She notes difficulty descending steps in flat shoes (anticipate tight heel cords) Goal status: INITIAL  5.  The patient will demonstrate standing head turns R<>L x 5 reps without loss of balance. Baseline:  Holds head still due to imbalance with turns. Goal status: INITIAL  ASSESSMENT:  CLINICAL IMPRESSION: The patient tolerated VOR x 1 viewing today in standing. She does have knee pain with standing balance tasks. PT worked on engaging quads during there ex and STM for  lateral thigh. Also added hamstring stretch to HEP.  PT to continue to progress working to LTGs.   OBJECTIVE IMPAIRMENTS: Abnormal gait, decreased activity tolerance, decreased balance, difficulty walking, decreased strength, impaired flexibility, and pain, dizziness.  PLAN:  PT FREQUENCY: 2x/week  PT DURATION: 8 weeks  PLANNED INTERVENTIONS: Therapeutic exercises, Therapeutic activity, Neuromuscular re-education, Balance training, Gait training, Patient/Family education, Self Care, Joint mobilization, Vestibular training, Canalith repositioning, Moist heat, Manual therapy, and Re-evaluation  PLAN FOR NEXT SESSION: Progress VOR as able, HEP review, additional LE flexibility/strength, dynamic balance, head/body turns. Difficulty with lifting 2 gal jug from floor due to shoulder pain/weakness, incorporate c-spine ROM and shoulder strength   12:05 PM, 08/11/22 Iann Rodier, PT

## 2022-08-13 ENCOUNTER — Ambulatory Visit: Payer: Medicare PPO

## 2022-08-18 ENCOUNTER — Ambulatory Visit: Payer: Medicare PPO | Admitting: Rehabilitative and Restorative Service Providers"

## 2022-08-18 ENCOUNTER — Encounter: Payer: Self-pay | Admitting: Rehabilitative and Restorative Service Providers"

## 2022-08-18 ENCOUNTER — Other Ambulatory Visit: Payer: Self-pay | Admitting: Family Medicine

## 2022-08-18 DIAGNOSIS — M6281 Muscle weakness (generalized): Secondary | ICD-10-CM

## 2022-08-18 DIAGNOSIS — M542 Cervicalgia: Secondary | ICD-10-CM

## 2022-08-18 DIAGNOSIS — R2689 Other abnormalities of gait and mobility: Secondary | ICD-10-CM

## 2022-08-18 DIAGNOSIS — R2681 Unsteadiness on feet: Secondary | ICD-10-CM

## 2022-08-18 NOTE — Therapy (Signed)
OUTPATIENT PHYSICAL THERAPY NEURO TREATMENT   Patient Name: [name] [name] MRN: [id number] DOB:1946/01/04, 77 y.o., female Today's Date: 08/18/2022 PCP: Karie Georges, MD REFERRING PROVIDER: Karie Georges, MD  END OF SESSION:  PT End of Session - 08/18/22 1150     Visit Number 5    Number of Visits 16    Date for PT Re-Evaluation 09/26/22    Authorization Type human[employer] medicare    Authorization Time Period 16 visits through 09/26/22    PT Start Time 1150    PT Stop Time 1230    PT Time Calculation (min) 40 min    Activity Tolerance Patient tolerated treatment well    Behavior During Therapy [facility] for tasks assessed/performed             Past Medical History:  Diagnosis Date   Aorto-iliac atherosclerosis (HCC)    last aaa duplex 08-30-2015  >50% stenosis bilateral common iliac arteries and left external iliac artery   Arrhythmia    Carotid stenosis, bilateral    cardiologist-- dr Jens Som (LOV 08-06-2015)  04-29-2017 followed by pcp/  last duplex 03-08-2017 bilateral ICA 40-59% post endarterectomy,  right ECA occluded   Cerebral vascular disease    Chronic gastritis    Elevated cholesterol    Esophageal stricture    Full dentures    GERD (gastroesophageal reflux disease)    Hiatal hernia    History of aortic valve disorder    per cardiology note dated 08-06-2015 by dr Jens Som pt has hx small fibroblastoma aortic valve per echo 2008 /  last echo 08-23-2015 no fibroblastoma noted or stenosis or thickened/ calcification   History of gastric ulcer 2013   History of nuclear stress test 06-17-2006   per dr Jens Som note dated 08-06-2015 , normal study   History of transient ischemic attack (TIA) 04/25/2006   severe left ICA stenosis/   04-29-2017 per pt no residual   HLD (hyperlipidemia)    HTN (hypertension)    IBS (irritable bowel syndrome)    OA (osteoarthritis)    Pneumonia    Prolapsed internal hemorrhoids, grade 3    PVD (peripheral vascular disease) (HCC)     Rectal bleed    SYMPTOM, ECCHYMOSES, SPONTANEOUS 06/16/2006   Qualifier: Diagnosis of  By: Cato Mulligan MD, Bruce     Vertigo    Wears contact lenses    Past Surgical History:  Procedure Laterality Date   CAROTID ENDARTERECTOMY Bilateral left 04-29-2006 and right 08-17-2007 by  dr c. [name]  [facility]   HEMORRHOID SURGERY N/A 05/07/2017   Procedure: ANAL EXAM UNDER ANESTHESIA, HEMORRHOIDECTOMY;  Surgeon: Romie Levee, MD;  Location: [facility];  Service: General;  Laterality: N/A;   NASAL SINUS SURGERY  age 40   TONSILLECTOMY  age 30   TRANSTHORACIC ECHOCARDIOGRAM  08/23/2015   ef 60-65%, grade 1 diastolic dysfunction/ mild AR and MR   VASCULAR SURGERY Bilateral    carotids   WRIST GANGLION EXCISION Right yrs ago   Patient Active Problem List   Diagnosis Date Noted   Prediabetes 12/23/2021   Statin myopathy 12/23/2021   CAROTID BRUIT 11/21/2008   HYPERCHOLESTEROLEMIA 11/16/2008   Osteoarthritis 11/16/2008   Hyperlipemia 06/16/2006   Essential hypertension 06/16/2006   Cerebrovascular disease 06/16/2006   GERD 06/16/2006    ONSET DATE: 06/01/2022 REFERRING DIAG:  Diagnosis  R29.6 (ICD-10-CM) - Frequent falls   THERAPY DIAG:  Muscle weakness (generalized)  Other abnormalities of gait and mobility  Unsteadiness on feet  Rationale for  Evaluation and Treatment: Rehabilitation  SUBJECTIVE:                                                                                                                                                                                        SUBJECTIVE STATEMENT: The patient is doing the exercises this week. Her R knee is sore due to walking a lot yesterday.  Pt accompanied by: self  PERTINENT HISTORY: cerebrovascular disease, hyperlipidemia, HTN, OA, hypercholesterolemia  PAIN:  Are you having pain? Yes: NPRS scale: 7/10 Pain location: both knees Pain description: bruised sensation  Aggravating factors: walking in stores,  walking longer distances Relieving factors: unsure, arthritic pain  PRECAUTIONS: Fall  WEIGHT BEARING RESTRICTIONS: No  FALLS: Has patient fallen in last 6 months? Yes. Number of falls 5  4 minor falls, 1 with injury  LIVING ENVIRONMENT: Lives with: lives alone Lives in: House/apartment Stairs: Yes: Internal: 4-5 steps; one side and External: 8 steps; one rail-- steep steps Has following equipment at home: None  PATIENT GOALS: reduce falls  OBJECTIVE:   [facility] Adult PT Treatment:                                                DATE: 08/18/22 Therapeutic Exercise: Standing Wall squats working on mini squat bumping hips to wall and upright x 10 reps Seated  Hamstring stretch Rand L sies Sit<>stand x 10 reps Neuromuscular re-ed: Standing VOR Horizontal gaze x 1 viewing with cues on ROM and speed Vertical gaze x 1 viewing with cues on ROM Habituation Reacing R and L posteriorly for stepping and return to midline working on functional turns for kitchen/IADL tasks Turning 180 degrees x 5 reps with visual targets with SBA Marching in place Gait: Dynamic gait activities with forward/backwards direction changes Figure 8 walking with SBA x 5 reps  *at end of session, patient inquired about her R shoulder-- she has exercises from prior PT.  We discussed and PT demo's AAROM for shoulder with a cane. Not included in today's therapy minutes due to nature of referral.    [facility] Adult PT Treatment:                                                DATE: 08/11/22 Therapeutic Exercise: Supine SLR with 2# R and L x 10 reps  Bridges x 10 reps bilat LEs Standing Heel raises  x 12 reps Toe raises during wall lean x 8 reps -- stopped due to knee pain Seated HS stretch Sit <> Stand x 6 reps Manual Therapy: STM R IT Band and taught patient to use muscle rolling stick (pastry roller) for home) Neuromuscular re-ed: Standing VOR Horizontal gaze x 1 viewing with cues on ROM and speed Vertical gaze x  1 viewing with cues on ROM Wall bumps With cues on hip strategy and weight shift Habituation Horizontal head turns with visual targets Turning 180 degrees x 5 reps with visual targets with SBA Marching in place Berg=49/56  [facility] PT Assessment - 08/18/22 1223       Standardized Balance Assessment   Standardized Balance Assessment Berg Balance Test      Berg Balance Test   Sit to Stand Able to stand without using hands and stabilize independently    Standing Unsupported Able to stand safely 2 minutes    Sitting with Back Unsupported but Feet Supported on Floor or Stool Able to sit safely and securely 2 minutes    Stand to Sit Sits safely with minimal use of hands    Transfers Able to transfer safely, minor use of hands    Standing Unsupported with Eyes Closed Able to stand 10 seconds safely    Standing Unsupported with Feet Together Able to place feet together independently and stand 1 minute safely    From Standing, Reach Forward with Outstretched Arm Can reach confidently >25 cm (10")    From Standing Position, Pick up Object from Floor Able to pick up shoe safely and easily    From Standing Position, Turn to Look Behind Over each Shoulder Turn sideways only but maintains balance    Turn 360 Degrees Able to turn 360 degrees safely in 4 seconds or less   gets mild dizziness   Standing Unsupported, Alternately Place Feet on Step/Stool Able to stand independently and safely and complete 8 steps in 20 seconds    Standing Unsupported, One Foot in Front Able to take small step independently and hold 30 seconds    Standing on One Leg Tries to lift leg/unable to hold 3 seconds but remains standing independently    Total Score 49    Berg comment: 49/56                PATIENT EDUCATION: Education details: HEP, plan of care Person educated: Patient Education method: Explanation, Demonstration, and Handouts Education comprehension: verbalized understanding and returned  demonstration  HOME EXERCISE PROGRAM: Access Code: [email or web address] URL: [email or web address] Date: 08/11/2022 Prepared by: Margretta Ditty  Exercises - Seated Gaze Stabilization with Head Rotation  - 3 x daily - 7 x weekly - 1-2 sets - 10 reps - Seated habituation head turns  - 2 x daily - 7 x weekly - 1 sets - 5 reps - Straight Leg Raise  - 2 x daily - 7 x weekly - 1 sets - 10 reps - Corner Balance Feet Together With Eyes Open  - 1 x daily - 7 x weekly - 3 sets - 30 hold - Corner Balance Feet Together With Eyes Closed  - 1 x daily - 7 x weekly - 3 sets - 30 sec hold - Corner Balance Feet Together: Eyes Open With Head Turns  - 1 x daily - 7 x weekly - 3 sets - 3 reps - Corner Balance Feet Together: Eyes Closed With Head Turns  - 1 x daily - 7 x weekly - 3 sets - 3  reps - Standing Gastroc Stretch on Step with Counter Support  - 1 x daily - 7 x weekly - 3 sets - 30 sec hold - Seated Hamstring Stretch with Chair  - 2 x daily - 7 x weekly - 1 sets - 10 reps _____________________________________________________________________________________________________________________ (Measures in this section from initial evaluation unless otherwise noted) COGNITION: Overall cognitive status: Within functional limits for tasks assessed   SENSATION: WFL  POSTURE: No Significant postural limitations  UPPER EXTREMITY ROM: dec'd shoulder flexion to 95 degrees bilaterally, can reach behind her back with the left arm, but not the right arm. She has tightness in her neck and shoulders   LOWER EXTREMITY AROM: WFLs, tightness in gastrocs due to shoewear (wears dress shoes)  LOWER EXTREMITY MMT   MMT Right Eval Left Eval  Hip flexion 4/5 4/5  Hip extension    Hip abduction    Hip adduction    Hip internal rotation    Hip external rotation    Knee flexion 4/5 4/5  Knee extension 4/5 4/5  Ankle dorsiflexion 4/5 4/5  Ankle plantarflexion    Ankle inversion    Ankle eversion     (Blank  rows = not tested)  BED MOBILITY:  independent  TRANSFERS: Sit to stand: Modified independence Stand to sit: Modified independence *uses hands  STAIRS: Level of Assistance: Modified independence Stair Negotiation Technique: Alternating Pattern  with Bilateral Rails Number of Stairs: 3   Comments: slowed pace, hesitates with steps descending  GAIT: Gait pattern:  had imbalance during gait noted with multiple lateral steps to correct for loss of balance Distance walked: 150 ft Assistive device utilized: None Level of assistance: Modified independence Comments: slowed pace Gait speed=2.18 ft/sec  Berg=35/56  PATIENT SURVEYS:  None indicated  VESTIBULAR ASSESSMENT: SYMPTOM BEHAVIOR:  Subjective history: patient notes difficulty with turns and looking up  Non-Vestibular symptoms:  unable to tolerate turns  Type of dizziness: Unsteady with head/body turns  Frequency: daily  Duration: with movement  Aggravating factors: Induced by motion: turning head quickly  Relieving factors: head stationary  Progression of symptoms: unchanged  OCULOMOTOR EXAM:  Ocular Alignment: normal  Ocular ROM: No Limitations  Spontaneous Nystagmus: absent  Gaze-Induced Nystagmus: absent  Smooth Pursuits: intact   VESTIBULAR - OCULAR REFLEX:   Slow VOR: Comment: gets sensation of visual movement and starts slowing down after 3 reps    POSITIONAL TESTING: Right Dix-Hallpike: no nystagmus Left Dix-Hallpike: no nystagmus Right Roll Test: no nystagmus Left Roll Test: no nystagmus Right Sidelying: no nystagmus Left Sidelying: no nystagmus *gets spinning vertigo with return to sitting  MOTION SENSITIVITY: Not assessed  Motion Sensitivity Quotient Intensity: 0 = none, 1 = Lightheaded, 2 = Mild, 3 = Moderate, 4 = Severe, 5 = Vomiting  Intensity  1. Sitting to supine   2. Supine to L side   3. Supine to R side   4. Supine to sitting   5. L Hallpike-Dix   6. Up from L    7. R Hallpike-Dix    8. Up from R    9. Sitting, head tipped to L knee   10. Head up from L knee   11. Sitting, head tipped to R knee   12. Head up from R knee   13. Sitting head turns x5   14.Sitting head nods x5   15. In stance, 180 turn to L    16. In stance, 180 turn to R     OTHOSTATICS: TBA   GOALS:  Goals reviewed with patient? Yes  SHORT TERM GOALS: Target date: 08/27/22  The patient will be indep with HEP. Baseline: initiated at eval Goal status: IN PROGRESS  2.  The patient will improve Berg balance score to > or equal to 40/56. Baseline: 35/56 Goal status: MET on 08/18/22 (see LTG)  3.  The patient will tolerate VOR x 1 viewing x 30 seconds. Baseline:  Stops head motion with 10 reps of VOR x 1 viewing Goal status: MET -- patient can do 50 seconds-- needs cues  4.  The patient will report knee pain < or equal to 6/10. Baseline: 8/10 Goal status: IN PROGRESS  LONG TERM GOALS: Target date: 09/26/22  The patient will be indep with HEP progression. Baseline:  initiated at eval Goal status: INITIAL  2.  The patient will improve Berg score to > or equal to 44/56. Baseline:  35/56 Goal status: MET 08/18/22  3.  The patient will improve gait speed to > or equal to 2.62 ft/sec to demo improved community gait. Baseline:  2.18 ft/sec Goal status: INITIAL  4.  The patient will demo standing without high heels on (has on 2" heel today) without pulling posteriorly. Baseline:  She notes difficulty descending steps in flat shoes (anticipate tight heel cords) Goal status: INITIAL  5.  The patient will demonstrate standing head turns R<>L x 5 reps without loss of balance. Baseline:  Holds head still due to imbalance with turns. Goal status: MET 08/18/22  ASSESSMENT:  CLINICAL IMPRESSION: The patient has met 1 STG and 2 LTGs. She is making progress with balance and dizziness, however, she continue with knee pain and dec'd balance with dynamic tasks. Patient has made significant gains on  Berg balance test improving from 36/56 up to 49/56 today.  PT to continue working to [employer] and LTGs. Patient notes she feels after 8/1 we can reduce frequency to 1x/week. She reports continued R shoulder pain and plans to seek care for R UE.  OBJECTIVE IMPAIRMENTS: Abnormal gait, decreased activity tolerance, decreased balance, difficulty walking, decreased strength, impaired flexibility, and pain, dizziness.  PLAN:  PT FREQUENCY: 2x/week  PT DURATION: 8 weeks  PLANNED INTERVENTIONS: Therapeutic exercises, Therapeutic activity, Neuromuscular re-education, Balance training, Gait training, Patient/Family education, Self Care, Joint mobilization, Vestibular training, Canalith repositioning, Moist heat, Manual therapy, and Re-evaluation  PLAN FOR NEXT SESSION: Progress VOR as able, HEP review, additional LE flexibility/strength, dynamic balance, head/body turns. Turning in place, dynamic gait tasks and functional items (stepping up on curbs, turning in kitchen)  11:50 AM, 08/18/22 Ishaaq Penna, PT

## 2022-08-20 ENCOUNTER — Ambulatory Visit: Payer: Medicare PPO

## 2022-08-20 DIAGNOSIS — R2689 Other abnormalities of gait and mobility: Secondary | ICD-10-CM

## 2022-08-20 DIAGNOSIS — M6281 Muscle weakness (generalized): Secondary | ICD-10-CM

## 2022-08-20 DIAGNOSIS — R2681 Unsteadiness on feet: Secondary | ICD-10-CM

## 2022-08-20 DIAGNOSIS — M542 Cervicalgia: Secondary | ICD-10-CM

## 2022-08-20 NOTE — Therapy (Signed)
OUTPATIENT PHYSICAL THERAPY NEURO TREATMENT   Patient Name: [name] [name] MRN: [id number] DOB:Dec 08, 1945, 77 y.o., female Today's Date: 08/20/2022 PCP: Karie Georges, MD REFERRING PROVIDER: Karie Georges, MD  END OF SESSION:  PT End of Session - 08/20/22 1436     Visit Number 6    Number of Visits 16    Date for PT Re-Evaluation 09/26/22    Authorization Type humana medicare    Authorization Time Period 16 visits through 09/26/22    PT Start Time 1445    PT Stop Time 1530    PT Time Calculation (min) 45 min    Activity Tolerance Patient tolerated treatment well    Behavior During Therapy [facility] for tasks assessed/performed             Past Medical History:  Diagnosis Date   Aorto-iliac atherosclerosis (HCC)    last aaa duplex 08-30-2015  >50% stenosis bilateral common iliac arteries and left external iliac artery   Arrhythmia    Carotid stenosis, bilateral    cardiologist-- dr Jens Som (LOV 08-06-2015)  04-29-2017 followed by pcp/  last duplex 03-08-2017 bilateral ICA 40-59% post endarterectomy,  right ECA occluded   Cerebral vascular disease    Chronic gastritis    Elevated cholesterol    Esophageal stricture    Full dentures    GERD (gastroesophageal reflux disease)    Hiatal hernia    History of aortic valve disorder    per cardiology note dated 08-06-2015 by dr Jens Som pt has hx small fibroblastoma aortic valve per echo 2008 /  last echo 08-23-2015 no fibroblastoma noted or stenosis or thickened/ calcification   History of gastric ulcer 2013   History of nuclear stress test 06-17-2006   per dr Jens Som note dated 08-06-2015 , normal study   History of transient ischemic attack (TIA) 04/25/2006   severe left ICA stenosis/   04-29-2017 per pt no residual   HLD (hyperlipidemia)    HTN (hypertension)    IBS (irritable bowel syndrome)    OA (osteoarthritis)    Pneumonia    Prolapsed internal hemorrhoids, grade 3    PVD (peripheral vascular disease) (HCC)     Rectal bleed    SYMPTOM, ECCHYMOSES, SPONTANEOUS 06/16/2006   Qualifier: Diagnosis of  By: Cato Mulligan MD, Bruce     Vertigo    Wears contact lenses    Past Surgical History:  Procedure Laterality Date   CAROTID ENDARTERECTOMY Bilateral left 04-29-2006 and right 08-17-2007 by  dr c. [name]  [facility]   HEMORRHOID SURGERY N/A 05/07/2017   Procedure: ANAL EXAM UNDER ANESTHESIA, HEMORRHOIDECTOMY;  Surgeon: Romie Levee, MD;  Location: [facility];  Service: General;  Laterality: N/A;   NASAL SINUS SURGERY  age 58   TONSILLECTOMY  age 16   TRANSTHORACIC ECHOCARDIOGRAM  08/23/2015   ef 60-65%, grade 1 diastolic dysfunction/ mild AR and MR   VASCULAR SURGERY Bilateral    carotids   WRIST GANGLION EXCISION Right yrs ago   Patient Active Problem List   Diagnosis Date Noted   Prediabetes 12/23/2021   Statin myopathy 12/23/2021   CAROTID BRUIT 11/21/2008   HYPERCHOLESTEROLEMIA 11/16/2008   Osteoarthritis 11/16/2008   Hyperlipemia 06/16/2006   Essential hypertension 06/16/2006   Cerebrovascular disease 06/16/2006   GERD 06/16/2006    ONSET DATE: 06/01/2022 REFERRING DIAG:  Diagnosis  R29.6 (ICD-10-CM) - Frequent falls   THERAPY DIAG:  Muscle weakness (generalized)  Other abnormalities of gait and mobility  Unsteadiness on feet  Cervicalgia  Rationale for Evaluation and Treatment: Rehabilitation  SUBJECTIVE:                                                                                                                                                                                        SUBJECTIVE STATEMENT: Dizziness/unsteadiness with fast head/body turns Pt accompanied by: self  PERTINENT HISTORY: cerebrovascular disease, hyperlipidemia, HTN, OA, hypercholesterolemia  PAIN:  Are you having pain? Yes: NPRS scale: 7/10 Pain location: both knees Pain description: bruised sensation  Aggravating factors: walking in stores, walking longer distances Relieving  factors: unsure, arthritic pain  PRECAUTIONS: Fall  WEIGHT BEARING RESTRICTIONS: No  FALLS: Has patient fallen in last 6 months? Yes. Number of falls 5  4 minor falls, 1 with injury  LIVING ENVIRONMENT: Lives with: lives alone Lives in: House/apartment Stairs: Yes: Internal: 4-5 steps; one side and External: 8 steps; one rail-- steep steps Has following equipment at home: None  PATIENT GOALS: reduce falls  OBJECTIVE:   TODAY'S TREATMENT: 08/20/22 Activity Comments  Repeated half turns Performing scanning for targets  Repeated head turns left Turns to identify target  Vertical head nods to target   PNF chops as VOR cancellation   Right shoulder AAROM to addres pain and limited ROM to improve ADL function         [facility] Adult PT Treatment:                                                DATE: 08/18/22 Therapeutic Exercise: Standing Wall squats working on mini squat bumping hips to wall and upright x 10 reps Seated  Hamstring stretch Rand L sies Sit<>stand x 10 reps Neuromuscular re-ed: Standing VOR Horizontal gaze x 1 viewing with cues on ROM and speed Vertical gaze x 1 viewing with cues on ROM Habituation Reacing R and L posteriorly for stepping and return to midline working on functional turns for kitchen/IADL tasks Turning 180 degrees x 5 reps with visual targets with SBA Marching in place Gait: Dynamic gait activities with forward/backwards direction changes Figure 8 walking with SBA x 5 reps  *at end of session, patient inquired about her R shoulder-- she has exercises from prior PT.  We discussed and PT demo's AAROM for shoulder with a cane. Not included in today's therapy minutes due to nature of referral.    [facility] Adult PT Treatment:  DATE: 08/11/22 Therapeutic Exercise: Supine SLR with 2# R and L x 10 reps  Bridges x 10 reps bilat LEs Standing Heel raises x 12 reps Toe raises during wall lean x 8 reps -- stopped due  to knee pain Seated HS stretch Sit <> Stand x 6 reps Manual Therapy: STM R IT Band and taught patient to use muscle rolling stick (pastry roller) for home) Neuromuscular re-ed: Standing VOR Horizontal gaze x 1 viewing with cues on ROM and speed Vertical gaze x 1 viewing with cues on ROM Wall bumps With cues on hip strategy and weight shift Habituation Horizontal head turns with visual targets Turning 180 degrees x 5 reps with visual targets with SBA Marching in place Berg=49/56       PATIENT EDUCATION: Education details: HEP, plan of care Person educated: Patient Education method: Explanation, Demonstration, and Handouts Education comprehension: verbalized understanding and returned demonstration  HOME EXERCISE PROGRAM: Access Code: [email or web address] URL: [email or web address] Date: 08/11/2022 Prepared by: Margretta Ditty  Exercises - Seated Gaze Stabilization with Head Rotation  - 3 x daily - 7 x weekly - 1-2 sets - 10 reps - Seated habituation head turns  - 2 x daily - 7 x weekly - 1 sets - 5 reps - Straight Leg Raise  - 2 x daily - 7 x weekly - 1 sets - 10 reps - Corner Balance Feet Together With Eyes Open  - 1 x daily - 7 x weekly - 3 sets - 30 hold - Corner Balance Feet Together With Eyes Closed  - 1 x daily - 7 x weekly - 3 sets - 30 sec hold - Corner Balance Feet Together: Eyes Open With Head Turns  - 1 x daily - 7 x weekly - 3 sets - 3 reps - Corner Balance Feet Together: Eyes Closed With Head Turns  - 1 x daily - 7 x weekly - 3 sets - 3 reps - Standing Gastroc Stretch on Step with Counter Support  - 1 x daily - 7 x weekly - 3 sets - 30 sec hold - Seated Hamstring Stretch with Chair  - 2 x daily - 7 x weekly - 1 sets - 10 reps _____________________________________________________________________________________________________________________ (Measures in this section from initial evaluation unless otherwise noted) COGNITION: Overall cognitive status:  Within functional limits for tasks assessed   SENSATION: WFL  POSTURE: No Significant postural limitations  UPPER EXTREMITY ROM: dec'd shoulder flexion to 95 degrees bilaterally, can reach behind her back with the left arm, but not the right arm. She has tightness in her neck and shoulders   LOWER EXTREMITY AROM: WFLs, tightness in gastrocs due to shoewear (wears dress shoes)  LOWER EXTREMITY MMT   MMT Right Eval Left Eval  Hip flexion 4/5 4/5  Hip extension    Hip abduction    Hip adduction    Hip internal rotation    Hip external rotation    Knee flexion 4/5 4/5  Knee extension 4/5 4/5  Ankle dorsiflexion 4/5 4/5  Ankle plantarflexion    Ankle inversion    Ankle eversion     (Blank rows = not tested)  BED MOBILITY:  independent  TRANSFERS: Sit to stand: Modified independence Stand to sit: Modified independence *uses hands  STAIRS: Level of Assistance: Modified independence Stair Negotiation Technique: Alternating Pattern  with Bilateral Rails Number of Stairs: 3   Comments: slowed pace, hesitates with steps descending  GAIT: Gait pattern:  had imbalance during gait noted with multiple lateral steps to correct  for loss of balance Distance walked: 150 ft Assistive device utilized: None Level of assistance: Modified independence Comments: slowed pace Gait speed=2.18 ft/sec  Berg=35/56  PATIENT SURVEYS:  None indicated  VESTIBULAR ASSESSMENT: SYMPTOM BEHAVIOR:  Subjective history: patient notes difficulty with turns and looking up  Non-Vestibular symptoms:  unable to tolerate turns  Type of dizziness: Unsteady with head/body turns  Frequency: daily  Duration: with movement  Aggravating factors: Induced by motion: turning head quickly  Relieving factors: head stationary  Progression of symptoms: unchanged  OCULOMOTOR EXAM:  Ocular Alignment: normal  Ocular ROM: No Limitations  Spontaneous Nystagmus: absent  Gaze-Induced Nystagmus: absent  Smooth  Pursuits: intact   VESTIBULAR - OCULAR REFLEX:   Slow VOR: Comment: gets sensation of visual movement and starts slowing down after 3 reps    POSITIONAL TESTING: Right Dix-Hallpike: no nystagmus Left Dix-Hallpike: no nystagmus Right Roll Test: no nystagmus Left Roll Test: no nystagmus Right Sidelying: no nystagmus Left Sidelying: no nystagmus *gets spinning vertigo with return to sitting  MOTION SENSITIVITY: Not assessed  Motion Sensitivity Quotient Intensity: 0 = none, 1 = Lightheaded, 2 = Mild, 3 = Moderate, 4 = Severe, 5 = Vomiting  Intensity  1. Sitting to supine   2. Supine to L side   3. Supine to R side   4. Supine to sitting   5. L Hallpike-Dix   6. Up from L    7. R Hallpike-Dix   8. Up from R    9. Sitting, head tipped to L knee   10. Head up from L knee   11. Sitting, head tipped to R knee   12. Head up from R knee   13. Sitting head turns x5   14.Sitting head nods x5   15. In stance, 180 turn to L    16. In stance, 180 turn to R     OTHOSTATICS: TBA   GOALS: Goals reviewed with patient? Yes  SHORT TERM GOALS: Target date: 08/27/22  The patient will be indep with HEP. Baseline: initiated at eval Goal status: IN PROGRESS  2.  The patient will improve Berg balance score to > or equal to 40/56. Baseline: 35/56 Goal status: MET on 08/18/22 (see LTG)  3.  The patient will tolerate VOR x 1 viewing x 30 seconds. Baseline:  Stops head motion with 10 reps of VOR x 1 viewing Goal status: MET -- patient can do 50 seconds-- needs cues  4.  The patient will report knee pain < or equal to 6/10. Baseline: 8/10 Goal status: IN PROGRESS  LONG TERM GOALS: Target date: 09/26/22  The patient will be indep with HEP progression. Baseline:  initiated at eval Goal status: INITIAL  2.  The patient will improve Berg score to > or equal to 44/56. Baseline:  35/56 Goal status: MET 08/18/22  3.  The patient will improve gait speed to > or equal to 2.62 ft/sec to demo  improved community gait. Baseline:  2.18 ft/sec Goal status: INITIAL  4.  The patient will demo standing without high heels on (has on 2" heel today) without pulling posteriorly. Baseline:  She notes difficulty descending steps in flat shoes (anticipate tight heel cords) Goal status: INITIAL  5.  The patient will demonstrate standing head turns R<>L x 5 reps without loss of balance. Baseline:  Holds head still due to imbalance with turns. Goal status: MET 08/18/22  ASSESSMENT:  CLINICAL IMPRESSION: Continued with vestibular activities to reduce motion sensitivity with head/body turns.  Pt reports right shoulder pain and demo limited ROM unable to place hand to posterior head or internally rotate beyond right PSIS. Demo techniques to improve shoulder ROM and decrease pain. Continued sessions to progress POC details and address deficits  OBJECTIVE IMPAIRMENTS: Abnormal gait, decreased activity tolerance, decreased balance, difficulty walking, decreased strength, impaired flexibility, and pain, dizziness.  PLAN:  PT FREQUENCY: 2x/week  PT DURATION: 8 weeks  PLANNED INTERVENTIONS: Therapeutic exercises, Therapeutic activity, Neuromuscular re-education, Balance training, Gait training, Patient/Family education, Self Care, Joint mobilization, Vestibular training, Canalith repositioning, Moist heat, Manual therapy, and Re-evaluation  PLAN FOR NEXT SESSION: Progress VOR as able, HEP review, additional LE flexibility/strength, dynamic balance, head/body turns. Turning in place, dynamic gait tasks and functional items (stepping up on curbs, turning in kitchen)  2:48 PM, 08/20/22 Dion Body, PT

## 2022-08-21 ENCOUNTER — Other Ambulatory Visit: Payer: Self-pay | Admitting: Family Medicine

## 2022-08-21 DIAGNOSIS — F17201 Nicotine dependence, unspecified, in remission: Secondary | ICD-10-CM

## 2022-08-25 ENCOUNTER — Ambulatory Visit: Payer: Medicare PPO

## 2022-08-25 DIAGNOSIS — R2681 Unsteadiness on feet: Secondary | ICD-10-CM

## 2022-08-25 DIAGNOSIS — M6281 Muscle weakness (generalized): Secondary | ICD-10-CM | POA: Diagnosis not present

## 2022-08-25 DIAGNOSIS — R2689 Other abnormalities of gait and mobility: Secondary | ICD-10-CM

## 2022-08-25 DIAGNOSIS — M542 Cervicalgia: Secondary | ICD-10-CM

## 2022-08-25 NOTE — Therapy (Signed)
OUTPATIENT PHYSICAL THERAPY NEURO TREATMENT   Patient Name: [name] [name] MRN: [id number] DOB:January 25, 1946, 77 y.o., female Today's Date: 08/25/2022 PCP: Karie Georges, MD REFERRING PROVIDER: Karie Georges, MD  END OF SESSION:  PT End of Session - 08/25/22 1449     Visit Number 7    Number of Visits 16    Date for PT Re-Evaluation 09/26/22    Authorization Type humana medicare    Authorization Time Period 16 visits through 09/26/22    PT Start Time 1445    PT Stop Time 1530    PT Time Calculation (min) 45 min    Activity Tolerance Patient tolerated treatment well    Behavior During Therapy [facility] for tasks assessed/performed             Past Medical History:  Diagnosis Date   Aorto-iliac atherosclerosis (HCC)    last aaa duplex 08-30-2015  >50% stenosis bilateral common iliac arteries and left external iliac artery   Arrhythmia    Carotid stenosis, bilateral    cardiologist-- dr Jens Som (LOV 08-06-2015)  04-29-2017 followed by pcp/  last duplex 03-08-2017 bilateral ICA 40-59% post endarterectomy,  right ECA occluded   Cerebral vascular disease    Chronic gastritis    Elevated cholesterol    Esophageal stricture    Full dentures    GERD (gastroesophageal reflux disease)    Hiatal hernia    History of aortic valve disorder    per cardiology note dated 08-06-2015 by dr Jens Som pt has hx small fibroblastoma aortic valve per echo 2008 /  last echo 08-23-2015 no fibroblastoma noted or stenosis or thickened/ calcification   History of gastric ulcer 2013   History of nuclear stress test 06-17-2006   per dr Jens Som note dated 08-06-2015 , normal study   History of transient ischemic attack (TIA) 04/25/2006   severe left ICA stenosis/   04-29-2017 per pt no residual   HLD (hyperlipidemia)    HTN (hypertension)    IBS (irritable bowel syndrome)    OA (osteoarthritis)    Pneumonia    Prolapsed internal hemorrhoids, grade 3    PVD (peripheral vascular disease) (HCC)     Rectal bleed    SYMPTOM, ECCHYMOSES, SPONTANEOUS 06/16/2006   Qualifier: Diagnosis of  By: Cato Mulligan MD, Bruce     Vertigo    Wears contact lenses    Past Surgical History:  Procedure Laterality Date   CAROTID ENDARTERECTOMY Bilateral left 04-29-2006 and right 08-17-2007 by  dr c. [name]  [facility]   HEMORRHOID SURGERY N/A 05/07/2017   Procedure: ANAL EXAM UNDER ANESTHESIA, HEMORRHOIDECTOMY;  Surgeon: Romie Levee, MD;  Location: [facility] ;  Service: General;  Laterality: N/A;   NASAL SINUS SURGERY  age 10   TONSILLECTOMY  age 61   TRANSTHORACIC ECHOCARDIOGRAM  08/23/2015   ef 60-65%, grade 1 diastolic dysfunction/ mild AR and MR   VASCULAR SURGERY Bilateral    carotids   WRIST GANGLION EXCISION Right yrs ago   Patient Active Problem List   Diagnosis Date Noted   Prediabetes 12/23/2021   Statin myopathy 12/23/2021   CAROTID BRUIT 11/21/2008   HYPERCHOLESTEROLEMIA 11/16/2008   Osteoarthritis 11/16/2008   Hyperlipemia 06/16/2006   Essential hypertension 06/16/2006   Cerebrovascular disease 06/16/2006   GERD 06/16/2006    ONSET DATE: 06/01/2022 REFERRING DIAG:  Diagnosis  R29.6 (ICD-10-CM) - Frequent falls   THERAPY DIAG:  Muscle weakness (generalized)  Other abnormalities of gait and mobility  Unsteadiness on feet  Cervicalgia  Rationale for Evaluation and Treatment: Rehabilitation  SUBJECTIVE:                                                                                                                                                                                        SUBJECTIVE STATEMENT: Dizziness has improved, lightheaded with with fast sit to stand. Chief complaint being shoulder and knee pain Pt accompanied by: self  PERTINENT HISTORY: cerebrovascular disease, hyperlipidemia, HTN, OA, hypercholesterolemia  PAIN:  Are you having pain? Yes: NPRS scale: 7/10 Pain location: both knees Pain description: bruised sensation  Aggravating  factors: walking in stores, walking longer distances Relieving factors: unsure, arthritic pain  PRECAUTIONS: Fall  WEIGHT BEARING RESTRICTIONS: No  FALLS: Has patient fallen in last 6 months? Yes. Number of falls 5  4 minor falls, 1 with injury  LIVING ENVIRONMENT: Lives with: lives alone Lives in: House/apartment Stairs: Yes: Internal: 4-5 steps; one side and External: 8 steps; one rail-- steep steps Has following equipment at home: None  PATIENT GOALS: reduce falls  OBJECTIVE:   TODAY'S TREATMENT: 08/25/22 Activity Comments  Forward flexion to Head/body turns 10x   Standing VOR x 1 x 30 sec 120 bpm, notable for 2-3 errors during trials  Head turns 5x No symptoms  Gait speed 3.24 ft/sec = 2.2 mph  Knee pain during walking level ground 7/10 knee pain  HEP review      TODAY'S TREATMENT: 08/20/22 Activity Comments  Repeated half turns Performing scanning for targets  Repeated head turns left Turns to identify target  Vertical head nods to target   PNF chops as VOR cancellation   Right shoulder AAROM to addres pain and limited ROM to improve ADL function            PATIENT EDUCATION: Education details: HEP, plan of care Person educated: Patient Education method: Explanation, Demonstration, and Handouts Education comprehension: verbalized understanding and returned demonstration  HOME EXERCISE PROGRAM: Access Code: [email or web address] URL: [email or web address] Date: 08/11/2022 Prepared by: Margretta Ditty  Exercises - Seated Gaze Stabilization with Head Rotation  - 3 x daily - 7 x weekly - 1-2 sets - 10 reps - Seated habituation head turns  - 2 x daily - 7 x weekly - 1 sets - 5 reps - Straight Leg Raise  - 2 x daily - 7 x weekly - 1 sets - 10 reps - Corner Balance Feet Together With Eyes Open  - 1 x daily - 7 x weekly - 3 sets - 30 hold - Corner Balance Feet Together With Eyes Closed  - 1 x daily - 7 x weekly - 3 sets - 30 sec hold Development worker, international aid  Feet  Together: Eyes Open With Head Turns  - 1 x daily - 7 x weekly - 3 sets - 3 reps - Corner Balance Feet Together: Eyes Closed With Head Turns  - 1 x daily - 7 x weekly - 3 sets - 3 reps - Standing Gastroc Stretch on Step with Counter Support  - 1 x daily - 7 x weekly - 3 sets - 30 sec hold - Seated Hamstring Stretch with Chair  - 2 x daily - 7 x weekly - 1 sets - 10 reps _____________________________________________________________________________________________________________________ (Measures in this section from initial evaluation unless otherwise noted) COGNITION: Overall cognitive status: Within functional limits for tasks assessed   SENSATION: WFL  POSTURE: No Significant postural limitations  UPPER EXTREMITY ROM: dec'd shoulder flexion to 95 degrees bilaterally, can reach behind her back with the left arm, but not the right arm. She has tightness in her neck and shoulders   LOWER EXTREMITY AROM: WFLs, tightness in gastrocs due to shoewear (wears dress shoes)  LOWER EXTREMITY MMT   MMT Right Eval Left Eval  Hip flexion 4/5 4/5  Hip extension    Hip abduction    Hip adduction    Hip internal rotation    Hip external rotation    Knee flexion 4/5 4/5  Knee extension 4/5 4/5  Ankle dorsiflexion 4/5 4/5  Ankle plantarflexion    Ankle inversion    Ankle eversion     (Blank rows = not tested)  BED MOBILITY:  independent  TRANSFERS: Sit to stand: Modified independence Stand to sit: Modified independence *uses hands  STAIRS: Level of Assistance: Modified independence Stair Negotiation Technique: Alternating Pattern  with Bilateral Rails Number of Stairs: 3   Comments: slowed pace, hesitates with steps descending  GAIT: Gait pattern:  had imbalance during gait noted with multiple lateral steps to correct for loss of balance Distance walked: 150 ft Assistive device utilized: None Level of assistance: Modified independence Comments: slowed pace Gait speed=2.18  ft/sec  Berg=35/56  PATIENT SURVEYS:  None indicated  VESTIBULAR ASSESSMENT: SYMPTOM BEHAVIOR:  Subjective history: patient notes difficulty with turns and looking up  Non-Vestibular symptoms:  unable to tolerate turns  Type of dizziness: Unsteady with head/body turns  Frequency: daily  Duration: with movement  Aggravating factors: Induced by motion: turning head quickly  Relieving factors: head stationary  Progression of symptoms: unchanged  OCULOMOTOR EXAM:  Ocular Alignment: normal  Ocular ROM: No Limitations  Spontaneous Nystagmus: absent  Gaze-Induced Nystagmus: absent  Smooth Pursuits: intact   VESTIBULAR - OCULAR REFLEX:   Slow VOR: Comment: gets sensation of visual movement and starts slowing down after 3 reps    POSITIONAL TESTING: Right Dix-Hallpike: no nystagmus Left Dix-Hallpike: no nystagmus Right Roll Test: no nystagmus Left Roll Test: no nystagmus Right Sidelying: no nystagmus Left Sidelying: no nystagmus *gets spinning vertigo with return to sitting  MOTION SENSITIVITY: Not assessed  Motion Sensitivity Quotient Intensity: 0 = none, 1 = Lightheaded, 2 = Mild, 3 = Moderate, 4 = Severe, 5 = Vomiting  Intensity  1. Sitting to supine   2. Supine to L side   3. Supine to R side   4. Supine to sitting   5. L Hallpike-Dix   6. Up from L    7. R Hallpike-Dix   8. Up from R    9. Sitting, head tipped to L knee   10. Head up from L knee   11. Sitting, head tipped to R knee   12.  Head up from R knee   13. Sitting head turns x5   14.Sitting head nods x5   15. In stance, 180 turn to L    16. In stance, 180 turn to R     OTHOSTATICS: TBA   GOALS: Goals reviewed with patient? Yes  SHORT TERM GOALS: Target date: 08/27/22  The patient will be indep with HEP. Baseline: initiated at eval Goal status: MET  2.  The patient will improve Berg balance score to > or equal to 40/56. Baseline: 35/56 Goal status: MET on 08/18/22 (see LTG)  3.  The patient  will tolerate VOR x 1 viewing x 30 seconds. Baseline:  Stops head motion with 10 reps of VOR x 1 viewing Goal status: MET -- patient can do 50 seconds-- needs cues  4.  The patient will report knee pain < or equal to 6/10. Baseline: 8/10 Goal status: IN PROGRESS  LONG TERM GOALS: Target date: 09/26/22  The patient will be indep with HEP progression. Baseline:  initiated at eval Goal status: INITIAL  2.  The patient will improve Berg score to > or equal to 44/56. Baseline:  35/56 Goal status: MET 08/18/22  3.  The patient will improve gait speed to > or equal to 2.62 ft/sec to demo improved community gait. Baseline:  2.18 ft/sec; (08/25/22) 3.24 ft/sec Goal status: MET  4.  The patient will demo standing without high heels on (has on 2" heel today) without pulling posteriorly. Baseline:  She notes difficulty descending steps in flat shoes (anticipate tight heel cords) Goal status: INITIAL  5.  The patient will demonstrate standing head turns R<>L x 5 reps without loss of balance. Baseline:  Holds head still due to imbalance with turns. Goal status: MET 08/18/22  ASSESSMENT:  CLINICAL IMPRESSION: Pt reports improved dizziness/unsteadiness symptoms and able to perform VOR x 1 at 120 bpm albeit with several error signals manifest w/ saccadic correction.  Performance of repeated forward bending to right/left head/body turns without issue.  Able to meet several STG/LTG with increase in gait speed evident.  Patient reports chief complaint at this time of right knee and shoulder pain.  Discussed transition of plan of care to other clinic more suited for orthopedic issues.    OBJECTIVE IMPAIRMENTS: Abnormal gait, decreased activity tolerance, decreased balance, difficulty walking, decreased strength, impaired flexibility, and pain, dizziness.  PLAN:  PT FREQUENCY: 2x/week  PT DURATION: 8 weeks  PLANNED INTERVENTIONS: Therapeutic exercises, Therapeutic activity, Neuromuscular  re-education, Balance training, Gait training, Patient/Family education, Self Care, Joint mobilization, Vestibular training, Canalith repositioning, Moist heat, Manual therapy, and Re-evaluation  PLAN FOR NEXT SESSION: Progress VOR as able, HEP review, additional LE flexibility/strength, dynamic balance, head/body turns. Turning in place, dynamic gait tasks and functional items (stepping up on curbs, turning in kitchen)  2:49 PM, 08/25/22 Dion Body, PT

## 2022-08-27 ENCOUNTER — Ambulatory Visit: Payer: Medicare PPO | Attending: Family Medicine | Admitting: Rehabilitative and Restorative Service Providers"

## 2022-08-27 ENCOUNTER — Encounter: Payer: Self-pay | Admitting: Rehabilitative and Restorative Service Providers"

## 2022-08-27 DIAGNOSIS — R2689 Other abnormalities of gait and mobility: Secondary | ICD-10-CM | POA: Insufficient documentation

## 2022-08-27 DIAGNOSIS — R2681 Unsteadiness on feet: Secondary | ICD-10-CM | POA: Diagnosis not present

## 2022-08-27 DIAGNOSIS — M6281 Muscle weakness (generalized): Secondary | ICD-10-CM | POA: Diagnosis not present

## 2022-08-27 NOTE — Therapy (Addendum)
OUTPATIENT PHYSICAL THERAPY NEURO TREATMENT and DISCHARGE SUMMARY  Patient Name: [name] [name] MRN: [id number] DOB:11/22/1945, 77 y.o., female Today's Date: 08/27/2022 PCP: Karie Georges, MD REFERRING PROVIDER: Karie Georges, MD   PHYSICAL THERAPY DISCHARGE SUMMARY  Visits from Start of Care: 8  Current functional level related to goals / functional outcomes: See goals below. Patient has met 4/5 LTGs, she was going to work on [employer] and return to PT (she cx last visit).   Remaining deficits: See below for last known status.   Education / Equipment: Comprehensive HEP   Patient agrees to discharge. Patient goals were partially met. Patient is being discharged due to not returning since the last visit.  END OF SESSION:  PT End of Session - 08/27/22 1333     Visit Number 8    Number of Visits 16    Date for PT Re-Evaluation 09/26/22    Authorization Type humana medicare    Authorization Time Period 16 visits through 09/26/22    PT Start Time 1345    PT Stop Time 1430    PT Time Calculation (min) 45 min    Activity Tolerance Patient tolerated treatment well    Behavior During Therapy [facility] for tasks assessed/performed            Past Medical History:  Diagnosis Date   Aorto-iliac atherosclerosis (HCC)    last aaa duplex 08-30-2015  >50% stenosis bilateral common iliac arteries and left external iliac artery   Arrhythmia    Carotid stenosis, bilateral    cardiologist-- dr Jens Som (LOV 08-06-2015)  04-29-2017 followed by pcp/  last duplex 03-08-2017 bilateral ICA 40-59% post endarterectomy,  right ECA occluded   Cerebral vascular disease    Chronic gastritis    Elevated cholesterol    Esophageal stricture    Full dentures    GERD (gastroesophageal reflux disease)    Hiatal hernia    History of aortic valve disorder    per cardiology note dated 08-06-2015 by dr Jens Som pt has hx small fibroblastoma aortic valve per echo 2008 /  last echo 08-23-2015 no  fibroblastoma noted or stenosis or thickened/ calcification   History of gastric ulcer 2013   History of nuclear stress test 06-17-2006   per dr Jens Som note dated 08-06-2015 , normal study   History of transient ischemic attack (TIA) 04/25/2006   severe left ICA stenosis/   04-29-2017 per pt no residual   HLD (hyperlipidemia)    HTN (hypertension)    IBS (irritable bowel syndrome)    OA (osteoarthritis)    Pneumonia    Prolapsed internal hemorrhoids, grade 3    PVD (peripheral vascular disease) (HCC)    Rectal bleed    SYMPTOM, ECCHYMOSES, SPONTANEOUS 06/16/2006   Qualifier: Diagnosis of  By: Cato Mulligan MD, Bruce     Vertigo    Wears contact lenses    Past Surgical History:  Procedure Laterality Date   CAROTID ENDARTERECTOMY Bilateral left 04-29-2006 and right 08-17-2007 by  dr c. [name]  [facility]   HEMORRHOID SURGERY N/A 05/07/2017   Procedure: ANAL EXAM UNDER ANESTHESIA, HEMORRHOIDECTOMY;  Surgeon: Romie Levee, MD;  Location: [facility] [place];  Service: General;  Laterality: N/A;   NASAL SINUS SURGERY  age 3   TONSILLECTOMY  age 28   TRANSTHORACIC ECHOCARDIOGRAM  08/23/2015   ef 60-65%, grade 1 diastolic dysfunction/ mild AR and MR   VASCULAR SURGERY Bilateral    carotids   WRIST GANGLION EXCISION Right yrs ago  Patient Active Problem List   Diagnosis Date Noted   Prediabetes 12/23/2021   Statin myopathy 12/23/2021   CAROTID BRUIT 11/21/2008   HYPERCHOLESTEROLEMIA 11/16/2008   Osteoarthritis 11/16/2008   Hyperlipemia 06/16/2006   Essential hypertension 06/16/2006   Cerebrovascular disease 06/16/2006   GERD 06/16/2006    ONSET DATE: 06/01/2022 REFERRING DIAG:  Diagnosis  R29.6 (ICD-10-CM) - Frequent falls   THERAPY DIAG:  Muscle weakness (generalized)  Other abnormalities of gait and mobility  Unsteadiness on feet  Rationale for Evaluation and Treatment: Rehabilitation  SUBJECTIVE:                                                                                                                                                                                         SUBJECTIVE STATEMENT: The patient reports that dizziness is better. She can turn without symptoms most days.  Feels knee pain each day when getting up from a chair or out of the car.  Pt accompanied by: self  PERTINENT HISTORY: cerebrovascular disease, hyperlipidemia, HTN, OA, hypercholesterolemia  PAIN:  Are you having pain? Yes: NPRS scale: 0 (none today)/10 Pain location: both knees Pain description: bruised sensation  Aggravating factors: walking in stores, walking longer distances Relieving factors: unsure, arthritic pain  PRECAUTIONS: Fall  WEIGHT BEARING RESTRICTIONS: No  FALLS: Has patient fallen in last 6 months? Yes. Number of falls 5  4 minor falls, 1 with injury  PATIENT GOALS: reduce falls  OBJECTIVE:  [facility] Adult PT Treatment:                                                DATE: 08/27/22 Therapeutic Exercise: Step ups with 4" step (aerobic stepper) adding turns 180 degrees with step ups Neuromuscular re-ed: Modified gaze x 1 viewing for HEP to standing, needed cues for technique and speed Corner standing with eyes open>eyes closed Corner standing with eyes closed narrowing base of support Corner standing with head turns with eyes open and eyes closed Gait: Stairs x 5 steps x 5 repetitions without handrail Self Care: The patient got a stepper for home (aerobic step).  TODAY'S TREATMENT: 08/25/22 Activity Comments  Forward flexion to Head/body turns 10x   Standing VOR x 1 x 30 sec 120 bpm, notable for 2-3 errors during trials  Head turns 5x No symptoms  Gait speed 3.24 ft/sec = 2.2 mph  Knee pain during walking level ground 7/10 knee pain  HEP review      PATIENT EDUCATION: Education details: HEP, plan of care Person  educated: Patient Education method: Explanation, Demonstration, and Handouts Education comprehension: verbalized understanding and  returned demonstration  HOME EXERCISE PROGRAM: Access Code: [email or web address] URL: [email or web address] Date: 08/27/2022 Prepared by: Margretta Ditty  Exercises - Standing Gaze Stabilization with Head Rotation  - 1 x daily - 7 x weekly - 1 sets - 1 reps - 30 seconds hold - Corner Balance Feet Together With Eyes Closed  - 1 x daily - 7 x weekly - 3 sets - 30 sec hold - Corner Balance Feet Together: Eyes Closed With Head Turns  - 1 x daily - 7 x weekly - 3 sets - 3 reps - Standing Gastroc Stretch on Step with Counter Support  - 1 x daily - 7 x weekly - 3 sets - 30 sec hold - Seated Hamstring Stretch with Chair  - 2 x daily - 7 x weekly - 1 sets - 2 reps - 30 seconds hold - Straight Leg Raise  - 1 x daily - 7 x weekly - 1 sets - 10 reps - Step Up with Hip External Rotation  - 1 x daily - 7 x weekly - 1 sets - 10 reps _____________________________________________________________________________________________________________________ (Measures in this section from initial evaluation unless otherwise noted) COGNITION: Overall cognitive status: Within functional limits for tasks assessed   SENSATION: WFL  POSTURE: No Significant postural limitations  UPPER EXTREMITY ROM: dec'd shoulder flexion to 95 degrees bilaterally, can reach behind her back with the left arm, but not the right arm. She has tightness in her neck and shoulders   LOWER EXTREMITY AROM: WFLs, tightness in gastrocs due to shoewear (wears dress shoes)  LOWER EXTREMITY MMT   MMT Right Eval Left Eval  Hip flexion 4/5 4/5  Hip extension    Hip abduction    Hip adduction    Hip internal rotation    Hip external rotation    Knee flexion 4/5 4/5  Knee extension 4/5 4/5  Ankle dorsiflexion 4/5 4/5  Ankle plantarflexion    Ankle inversion    Ankle eversion     (Blank rows = not tested)  BED MOBILITY:  independent  TRANSFERS: Sit to stand: Modified independence Stand to sit: Modified independence *uses  hands  STAIRS: Level of Assistance: Modified independence Stair Negotiation Technique: Alternating Pattern  with Bilateral Rails Number of Stairs: 3   Comments: slowed pace, hesitates with steps descending  GAIT: Gait pattern:  had imbalance during gait noted with multiple lateral steps to correct for loss of balance Distance walked: 150 ft Assistive device utilized: None Level of assistance: Modified independence Comments: slowed pace Gait speed=2.18 ft/sec  Berg=35/56  PATIENT SURVEYS:  None indicated  VESTIBULAR ASSESSMENT: SYMPTOM BEHAVIOR:  Subjective history: patient notes difficulty with turns and looking up  Non-Vestibular symptoms:  unable to tolerate turns  Type of dizziness: Unsteady with head/body turns  Frequency: daily  Duration: with movement  Aggravating factors: Induced by motion: turning head quickly  Relieving factors: head stationary  Progression of symptoms: unchanged  OCULOMOTOR EXAM:  Ocular Alignment: normal  Ocular ROM: No Limitations  Spontaneous Nystagmus: absent  Gaze-Induced Nystagmus: absent  Smooth Pursuits: intact   VESTIBULAR - OCULAR REFLEX:   Slow VOR: Comment: gets sensation of visual movement and starts slowing down after 3 reps    POSITIONAL TESTING: Right Dix-Hallpike: no nystagmus Left Dix-Hallpike: no nystagmus Right Roll Test: no nystagmus Left Roll Test: no nystagmus Right Sidelying: no nystagmus Left Sidelying: no nystagmus *gets spinning vertigo with return to sitting   OTHOSTATICS:  TBA   GOALS: Goals reviewed with patient? Yes  SHORT TERM GOALS: Target date: 08/27/22  The patient will be indep with HEP. Baseline: initiated at eval Goal status: MET  2.  The patient will improve Berg balance score to > or equal to 40/56. Baseline: 35/56 Goal status: MET on 08/18/22 (see LTG)  3.  The patient will tolerate VOR x 1 viewing x 30 seconds. Baseline:  Stops head motion with 10 reps of VOR x 1 viewing Goal status:  MET -- patient can do 50 seconds-- needs cues  4.  The patient will report knee pain < or equal to 6/10. Baseline: 8/10 at eval-- none at rest today, but gets pain regularly. Goal status: MET  LONG TERM GOALS: Target date: 09/26/22  The patient will be indep with HEP progression. Baseline:  initiated at eval Goal status: INITIAL  2.  The patient will improve Berg score to > or equal to 44/56. Baseline:  35/56 Goal status: MET 08/18/22  3.  The patient will improve gait speed to > or equal to 2.62 ft/sec to demo improved community gait. Baseline:  2.18 ft/sec; (08/25/22) 3.24 ft/sec Goal status: MET  4.  The patient will demo standing without high heels on (has on 2" heel today) without pulling posteriorly. Baseline:  She notes difficulty descending steps in flat shoes (anticipate tight heel cords) Goal status: MET on 08/27/22  5.  The patient will demonstrate standing head turns R<>L x 5 reps without loss of balance. Baseline:  Holds head still due to imbalance with turns. Goal status: MET 08/18/22  ASSESSMENT:  CLINICAL IMPRESSION: Patient notes continued improvement. She has met all STGs and LTGS (except indep with HEP). PT modified today and plans to bring the patient back in 1-2 weeks to check progress with updated program. Plan to d/c if going well.  OBJECTIVE IMPAIRMENTS: Abnormal gait, decreased activity tolerance, decreased balance, difficulty walking, decreased strength, impaired flexibility, and pain, dizziness.  PLAN:  PT FREQUENCY: 2x/week  PT DURATION: 8 weeks  PLANNED INTERVENTIONS: Therapeutic exercises, Therapeutic activity, Neuromuscular re-education, Balance training, Gait training, Patient/Family education, Self Care, Joint mobilization, Vestibular training, Canalith repositioning, Moist heat, Manual therapy, and Re-evaluation  PLAN FOR NEXT SESSION: check HEP and discharge if progressing well.   1:43 PM, 08/27/22 Aubery Douthat, PT

## 2022-09-01 ENCOUNTER — Ambulatory Visit: Payer: Medicare PPO | Admitting: Rehabilitative and Restorative Service Providers"

## 2022-09-04 ENCOUNTER — Ambulatory Visit (INDEPENDENT_AMBULATORY_CARE_PROVIDER_SITE_OTHER): Payer: Medicare PPO

## 2022-09-04 VITALS — Ht 61.0 in | Wt 142.0 lb

## 2022-09-04 DIAGNOSIS — Z0000 Encounter for general adult medical examination without abnormal findings: Secondary | ICD-10-CM

## 2022-09-04 NOTE — Progress Notes (Signed)
Subjective:   [name] [name] is a 77 y.o. female who presents for Medicare Annual (Subsequent) preventive examination.  Visit Complete: Virtual  I connected with  [name] [name] on 09/04/22 by a audio enabled telemedicine application and verified that I am speaking with the correct person using two identifiers.  Patient Location: Home  Provider Location: Home Office  I discussed the limitations of evaluation and management by telemedicine. The patient expressed understanding and agreed to proceed.  Patient Medicare AWV questionnaire was completed by the patient on  ; I have confirmed that all information answered by patient is correct and no changes since this date.  Review of Systems    Vital Signs: Unable to obtain new vitals due to this being a telehealth visit.  Cardiac Risk Factors include: advanced age (>38men, >33 women);hypertension     Objective:    Today's Vitals   09/04/22 1439  Weight: 142 lb (64.4 kg)  Height: 5\' 1"  (1.549 m)   Body mass index is 26.83 kg/m.     09/04/2022    2:51 PM 03/21/2022   11:00 PM 05/08/2021    2:03 PM 03/31/2021    3:20 PM 03/06/2020   11:42 AM 03/25/2019    9:32 AM 03/06/2019   11:48 AM  Advanced Directives  Does Patient Have a Medical Advance Directive? Yes Yes Yes Yes Yes Yes Yes  Type of Estate agent of [place];Living will Healthcare Power of [place];Living will Living will;Healthcare Power of [employer] Power of [place];Living will Healthcare Power of [place];Living will Healthcare Power of [place];Living will Healthcare Power of [place];Living will  Does patient want to make changes to medical advance directive?   No - Patient declined  No - Patient declined  No - Patient declined  Copy of Healthcare Power of Attorney in Chart? No - copy requested  No - copy requested No - copy requested No - copy requested No - copy requested No - copy requested    Current Medications (verified) Outpatient  Encounter Medications as of 09/04/2022  Medication Sig   aspirin EC 81 MG tablet Take 1 tablet (81 mg total) by mouth daily.   buPROPion (WELLBUTRIN XL) 300 MG 24 hr tablet Take 1 tablet by mouth once daily   Cholecalciferol (VITAMIN D3) 25 MCG (1000 UT) CAPS Take 1 capsule by mouth daily.   Evolocumab (REPATHA SURECLICK) 140 MG/ML SOAJ Inject 140 mg into the skin every 14 (fourteen) days.   lisinopril (ZESTRIL) 20 MG tablet Take 1 tablet (20 mg total) by mouth daily. TAKE 1 TABLET BY MOUTH ONCE DAILY . APPOINTMENT REQUIRED FOR FUTURE REFILLS   meclizine (ANTIVERT) 25 MG tablet Take 1 tablet (25 mg total) by mouth 3 (three) times daily as needed for dizziness.   metoprolol succinate (TOPROL-XL) 50 MG 24 hr tablet Take 1 tablet (50 mg total) by mouth daily. Take with or immediately following a meal.   pantoprazole (PROTONIX) 40 MG tablet TAKE 1 TABLET BY MOUTH ONCE DAILY. APPOINTMENT REQUIRED FOR FUTURE REFILLS.   tiZANidine (ZANAFLEX) 4 MG tablet TAKE 1/2 TO 1 (ONE-HALF TO ONE) TABLET BY MOUTH TWICE DAILY AS NEEDED FOR MUSCLE SPASM   traZODone (DESYREL) 50 MG tablet TAKE 1/2 TO 1 (ONE-HALF TO ONE) TABLET BY MOUTH AT BEDTIME AS NEEDED   vitamin B-12 (CYANOCOBALAMIN) 1000 MCG tablet Take 1,000 mcg by mouth daily.   No facility-administered encounter medications on file as of 09/04/2022.    Allergies (verified) Statins and Sulfa antibiotics   History:  Past Medical History:  Diagnosis Date   Aorto-iliac atherosclerosis (HCC)    last aaa duplex 08-30-2015  >50% stenosis bilateral common iliac arteries and left external iliac artery   Arrhythmia    Carotid stenosis, bilateral    cardiologist-- dr [name] [name] (LOV 08-06-2015)  04-29-2017 followed by pcp/  last duplex 03-08-2017 bilateral ICA 40-59% post endarterectomy,  right ECA occluded   Cerebral vascular disease    Chronic gastritis    Elevated cholesterol    Esophageal stricture    Full dentures    GERD (gastroesophageal reflux disease)     Hiatal hernia    History of aortic valve disorder    per cardiology note dated 08-06-2015 by dr [name] [name] pt has hx small fibroblastoma aortic valve per echo 2008 /  last echo 08-23-2015 no fibroblastoma noted or stenosis or thickened/ calcification   History of gastric ulcer 2013   History of nuclear stress test 06-17-2006   per dr [name] [name] note dated 08-06-2015 , normal study   History of transient ischemic attack (TIA) 04/25/2006   severe left ICA stenosis/   04-29-2017 per pt no residual   HLD (hyperlipidemia)    HTN (hypertension)    IBS (irritable bowel syndrome)    OA (osteoarthritis)    Pneumonia    Prolapsed internal hemorrhoids, grade 3    PVD (peripheral vascular disease) (HCC)    Rectal bleed    SYMPTOM, ECCHYMOSES, SPONTANEOUS 06/16/2006   Qualifier: Diagnosis of  By: [name] Mulligan [name], [name]     Vertigo    Wears contact lenses    Past Surgical History:  Procedure Laterality Date   CAROTID ENDARTERECTOMY Bilateral left 04-29-2006 and right 08-17-2007 by  dr [name] [name]  [facility]   HEMORRHOID SURGERY N/A 05/07/2017   Procedure: ANAL EXAM UNDER ANESTHESIA, HEMORRHOIDECTOMY;  Surgeon: [name] Levee, [name];  Location: [facility] [place];  Service: General;  Laterality: N/A;   NASAL SINUS SURGERY  age 3   TONSILLECTOMY  age 82   TRANSTHORACIC ECHOCARDIOGRAM  08/23/2015   ef 60-65%, grade 1 diastolic dysfunction/ mild AR and MR   VASCULAR SURGERY Bilateral    carotids   WRIST GANGLION EXCISION Right yrs ago   Family History  Problem Relation Age of Onset   Stroke Father    Diabetes Mother    Asthma Mother    Heart disease Brother    Diabetes Other    Breast cancer Other    Rheum arthritis Sister    Colon cancer Neg Hx    Social History   Socioeconomic History   Marital status: Widowed    Spouse name: Not on file   Number of children: 0   Years of education: 14   Highest education level: Associate degree: occupational, Scientist, product/process development, or vocational program  Occupational  History   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   Social Determinants of Health   Financial Resource Strain: Low Risk  (09/04/2022)   Overall Financial Resource Strain (CARDIA)    Difficulty of Paying Living Expenses: Not hard at all  Food Insecurity: No Food Insecurity (09/04/2022)   Hunger Vital Sign    Worried About Running Out of Food in the Last Year: Never true    Ran Out of Food in the Last Year: Never true  Transportation Needs: No Transportation Needs (09/04/2022)   PRAPARE - Administrator, Civil Service (Medical): No    Lack of Transportation (Non-Medical): No  Physical Activity: Insufficiently Active (09/04/2022)   Exercise Vital Sign    Days of Exercise per Week: 4 days    Minutes of Exercise per Session: 30 min  Stress: No Stress Concern Present (09/04/2022)   [employer] of Occupational Health - Occupational Stress Questionnaire    Feeling of Stress : Not at all  Social Connections: Moderately Isolated (03/06/2020)   Social Connection and Isolation Panel [NHANES]    Frequency of Communication with Friends and Family: More than three times a week     Frequency of Social Gatherings with Friends and Family: More than three times a week    Attends Religious Services: More than 4 times per year    Active Member of [employer] or Organizations: No    Attends Banker Meetings: Never    Marital Status: Widowed    Tobacco Counseling Counseling given: Not Answered   Clinical Intake:  Pre-visit preparation completed: No  Pain : No/denies pain     BMI - recorded: 26.83 Nutritional Status: BMI 25 -29 Overweight Nutritional Risks: None Diabetes: No  How often do you need to have someone help you when you read instructions, pamphlets, or other written materials from your doctor or pharmacy?: 1 - Never  Interpreter Needed?: No  Information entered by :: Theresa Mulligan LPN   Activities of Daily Living    09/04/2022    2:47 PM  In your present state of health, do you have any difficulty performing the following activities:  Hearing? 0  Vision? 0  Difficulty concentrating or making decisions? 0  Walking or climbing stairs? 0  Dressing or bathing? 0  Doing errands, shopping? 0  Preparing Food and eating ? N  Using the Toilet? N  In the past six months, have you accidently leaked urine? N  Do you have problems with loss of bowel control? N  Managing your Medications? N  Managing your Finances? N  Housekeeping or managing your Housekeeping? N    Patient Care Team: Karie Georges, [name] as PCP - General (Family Medicine) [name] [name] Madolyn Frieze, [name] as Consulting Physician (Cardiology) Sheppard Evens (Ophthalmology)  Indicate any recent Medical Services you may have received from other than [facility] providers in the past year (date may be approximate).     Assessment:   This is a routine wellness examination for Kathyrn.  Hearing/Vision screen Hearing Screening - Comments:: Denies hearing difficulties   Vision Screening - Comments:: Wears rx glasses - up to date with routine eye exams with  [facility]  Dietary issues and  exercise activities discussed:     Goals Addressed  This Visit's Progress     Stay Healthy (pt-stated)         Depression Screen    09/04/2022    2:46 PM 06/01/2022   10:31 AM 12/23/2021    9:26 AM 03/31/2021    3:23 PM 01/15/2021   11:58 AM 03/06/2020   11:49 AM 03/06/2019   11:49 AM  PHQ 2/9 Scores  PHQ - 2 Score 0 2 1 0 0 0 0  PHQ- 9 Score  7 6  0      Fall Risk    09/04/2022    2:47 PM 03/31/2021    3:21 PM 03/06/2020   11:47 AM 03/06/2019   11:49 AM 03/02/2018   10:13 AM  Fall Risk   Falls in the past year? 1 1 1  0 0  Comment  slipped on the wet floor     Number falls in past yr: 0 0 0    Injury with Fall? 1 0 0    Comment Hematoma to forehead and bruises. Followed by medical attention      Risk for fall due to : No Fall Risks;Impaired balance/gait Medication side effect No Fall Risks    Follow up Falls prevention discussed Falls evaluation completed;Education provided;Falls prevention discussed Falls evaluation completed;Falls prevention discussed Falls evaluation completed;Education provided;Falls prevention discussed     MEDICARE RISK AT HOME:  Medicare Risk at Home - 09/04/22 1457     Any stairs in or around the home? Yes    If so, are there any without handrails? No    Home free of loose throw rugs in walkways, pet beds, electrical cords, etc? Yes    Adequate lighting in your home to reduce risk of falls? Yes    Life alert? Yes    Use of a cane, walker or w/c? Yes    Grab bars in the bathroom? No    Shower chair or bench in shower? No    Elevated toilet seat or a handicapped toilet? Yes             TIMED UP AND GO:  Was the test performed?  No    Cognitive Function:        09/04/2022    2:51 PM 03/31/2021    3:25 PM 03/06/2019   11:51 AM  6CIT Screen  What Year? 0 points 0 points 0 points  What month? 0 points 0 points 0 points  What time? 0 points 0 points 0 points  Count back from 20 0 points 0 points 0 points  Months in reverse 0  points 0 points 0 points  Repeat phrase 0 points 2 points 0 points  Total Score 0 points 2 points 0 points    Immunizations Immunization History  Administered Date(s) Administered   Fluad Quad(high Dose 65+) 01/15/2021, 12/23/2021   Influenza-Unspecified 01/26/2018, 12/27/2019   PFIZER(Purple Top)SARS-COV-2 Vaccination 01/04/2020, 01/25/2020   Pneumococcal Conjugate-13 12/11/2013, 03/02/2018   Pneumococcal Polysaccharide-23 01/24/2015   Tdap 01/26/2009   Zoster Recombinant(Shingrix) 03/02/2018, 09/02/2018    TDAP status: Due, Education has been provided regarding the importance of this vaccine. Advised may receive this vaccine at local pharmacy or Health Dept. Aware to provide a copy of the vaccination record if obtained from local pharmacy or Health Dept. Verbalized acceptance and understanding.  Flu Vaccine status: Due, Education has been provided regarding the importance of this vaccine. Advised may receive this vaccine at local pharmacy or Health Dept. Aware to provide a copy of the vaccination record if  obtained from local pharmacy or Health Dept. Verbalized acceptance and understanding.  Pneumococcal vaccine status: Up to date  Covid-19 vaccine status: Declined, Education has been provided regarding the importance of this vaccine but patient still declined. Advised may receive this vaccine at local pharmacy or Health Dept.or vaccine clinic. Aware to provide a copy of the vaccination record if obtained from local pharmacy or Health Dept. Verbalized acceptance and understanding.  Qualifies for Shingles Vaccine? Yes   Zostavax completed Yes   Shingrix Completed?: Yes  Screening Tests Health Maintenance  Topic Date Due   DTaP/Tdap/Td (2 - Td or Tdap) 01/27/2019   COVID-19 Vaccine (3 - 2023-24 season) 09/26/2021   INFLUENZA VACCINE  08/27/2022   Medicare Annual Wellness (AWV)  09/04/2023   Pneumonia Vaccine 67+ Years old  Completed   DEXA SCAN  Completed   Hepatitis C Screening   Completed   Zoster Vaccines- Shingrix  Completed   HPV VACCINES  Aged Out   MAMMOGRAM  Discontinued   Colonoscopy  Discontinued    Health Maintenance  Health Maintenance Due  Topic Date Due   DTaP/Tdap/Td (2 - Td or Tdap) 01/27/2019   COVID-19 Vaccine (3 - 2023-24 season) 09/26/2021   INFLUENZA VACCINE  08/27/2022    Colorectal cancer screening: No longer required.   Mammogram status: No longer required due to Age.  Bone Density status: Completed 03/20/19. Results reflect: Bone density results: OSTEOPOROSIS. Repeat every   years.  Lung Cancer Screening: (Low Dose CT Chest recommended if Age 22-80 years, 20 pack-year currently smoking OR have quit w/in 15years.) does not qualify.     Additional Screening:  Hepatitis C Screening: does qualify; Completed 04/12/20  Vision Screening: Recommended annual ophthalmology exams for early detection of glaucoma and other disorders of the eye. Is the patient up to date with their annual eye exam?  Yes  Who is the provider or what is the name of the office in which the patient attends annual eye exams? [facility] If pt is not established with a provider, would they like to be referred to a provider to establish care? No .   Dental Screening: Recommended annual dental exams for proper oral hygiene    Community Resource Referral / Chronic Care Management:  CRR required this visit?  No   CCM required this visit?  No     Plan:     I have personally reviewed and noted the following in the patient's chart:   Medical and social history Use of alcohol, tobacco or illicit drugs  Current medications and supplements including opioid prescriptions. Patient is not currently taking opioid prescriptions. Functional ability and status Nutritional status Physical activity Advanced directives List of other physicians Hospitalizations, surgeries, and ER visits in previous 12 months Vitals Screenings to include cognitive, depression, and  falls Referrals and appointments  In addition, I have reviewed and discussed with patient certain preventive protocols, quality metrics, and best practice recommendations. A written personalized care plan for preventive services as well as general preventive health recommendations were provided to patient.     Tillie Rung, LPN   09/01/5782   After Visit Summary: (MyChart) Due to this being a telephonic visit, the after visit summary with patients personalized plan was offered to patient via MyChart   Nurse Notes: None

## 2022-09-04 NOTE — Patient Instructions (Addendum)
[name] [name] , Thank you for taking time to come for your Medicare Wellness Visit. I appreciate your ongoing commitment to your health goals. Please review the following plan we discussed and let me know if I can assist you in the future.   Referrals/Orders/Follow-Ups/Clinician Recommendations:   This is a list of the screening recommended for you and due dates:  Health Maintenance  Topic Date Due   DTaP/Tdap/Td vaccine (2 - Td or Tdap) 01/27/2019   COVID-19 Vaccine (3 - 2023-24 season) 09/26/2021   Flu Shot  08/27/2022   Medicare Annual Wellness Visit  09/04/2023   Pneumonia Vaccine  Completed   DEXA scan (bone density measurement)  Completed   Hepatitis C Screening  Completed   Zoster (Shingles) Vaccine  Completed   HPV Vaccine  Aged Out   Mammogram  Discontinued   Colon Cancer Screening  Discontinued    Advanced directives: (Copy Requested) Please bring a copy of your health care power of attorney and living will to the office to be added to your chart at your convenience.  Next Medicare Annual Wellness Visit scheduled for next year: Yes  Preventive Care 67 Years and Older, Female Preventive care refers to lifestyle choices and visits with your health care provider that can promote health and wellness. What does preventive care include? A yearly physical exam. This is also called an annual well check. Dental exams once or twice a year. Routine eye exams. Ask your health care provider how often you should have your eyes checked. Personal lifestyle choices, including: Daily care of your teeth and gums. Regular physical activity. Eating a healthy diet. Avoiding tobacco and drug use. Limiting alcohol use. Practicing safe sex. Taking low-dose aspirin every day. Taking vitamin and mineral supplements as recommended by your health care provider. What happens during an annual well check? The services and screenings done by your health care provider during your annual well check will  depend on your age, overall health, lifestyle risk factors, and family history of disease. Counseling  Your health care provider may ask you questions about your: Alcohol use. Tobacco use. Drug use. Emotional well-being. Home and relationship well-being. Sexual activity. Eating habits. History of falls. Memory and ability to understand (cognition). Work and work Astronomer. Reproductive health. Screening  You may have the following tests or measurements: Height, weight, and BMI. Blood pressure. Lipid and cholesterol levels. These may be checked every 5 years, or more frequently if you are over 66 years old. Skin check. Lung cancer screening. You may have this screening every year starting at age 55 if you have a 30-pack-year history of smoking and currently smoke or have quit within the past 15 years. Fecal occult blood test (FOBT) of the stool. You may have this test every year starting at age 63. Flexible sigmoidoscopy or colonoscopy. You may have a sigmoidoscopy every 5 years or a colonoscopy every 10 years starting at age 65. Hepatitis C blood test. Hepatitis B blood test. Sexually transmitted disease (STD) testing. Diabetes screening. This is done by checking your blood sugar (glucose) after you have not eaten for a while (fasting). You may have this done every 1-3 years. Bone density scan. This is done to screen for osteoporosis. You may have this done starting at age 78. Mammogram. This may be done every 1-2 years. Talk to your health care provider about how often you should have regular mammograms. Talk with your health care provider about your test results, treatment options, and if necessary, the need  for more tests. Vaccines  Your health care provider may recommend certain vaccines, such as: Influenza vaccine. This is recommended every year. Tetanus, diphtheria, and acellular pertussis (Tdap, Td) vaccine. You may need a Td booster every 10 years. Zoster vaccine. You may  need this after age 64. Pneumococcal 13-valent conjugate (PCV13) vaccine. One dose is recommended after age 35. Pneumococcal polysaccharide (PPSV23) vaccine. One dose is recommended after age 48. Talk to your health care provider about which screenings and vaccines you need and how often you need them. This information is not intended to replace advice given to you by your health care provider. Make sure you discuss any questions you have with your health care provider. Document Released: 02/08/2015 Document Revised: 10/02/2015 Document Reviewed: 11/13/2014 Elsevier Interactive Patient Education  2017 [employer].  Fall Prevention in the Home Falls can cause injuries. They can happen to people of all ages. There are many things you can do to make your home safe and to help prevent falls. What can I do on the outside of my home? Regularly fix the edges of walkways and driveways and fix any cracks. Remove anything that might make you trip as you walk through a door, such as a raised step or threshold. Trim any bushes or trees on the path to your home. Use bright outdoor lighting. Clear any walking paths of anything that might make someone trip, such as rocks or tools. Regularly check to see if handrails are loose or broken. Make sure that both sides of any steps have handrails. Any raised decks and porches should have guardrails on the edges. Have any leaves, snow, or ice cleared regularly. Use sand or salt on walking paths during winter. Clean up any spills in your garage right away. This includes oil or grease spills. What can I do in the bathroom? Use night lights. Install grab bars by the toilet and in the tub and shower. Do not use towel bars as grab bars. Use non-skid mats or decals in the tub or shower. If you need to sit down in the shower, use a plastic, non-slip stool. Keep the floor dry. Clean up any water that spills on the floor as soon as it happens. Remove soap buildup in  the tub or shower regularly. Attach bath mats securely with double-sided non-slip rug tape. Do not have throw rugs and other things on the floor that can make you trip. What can I do in the bedroom? Use night lights. Make sure that you have a light by your bed that is easy to reach. Do not use any sheets or blankets that are too big for your bed. They should not hang down onto the floor. Have a firm chair that has side arms. You can use this for support while you get dressed. Do not have throw rugs and other things on the floor that can make you trip. What can I do in the kitchen? Clean up any spills right away. Avoid walking on wet floors. Keep items that you use a lot in easy-to-reach places. If you need to reach something above you, use a strong step stool that has a grab bar. Keep electrical cords out of the way. Do not use floor polish or wax that makes floors slippery. If you must use wax, use non-skid floor wax. Do not have throw rugs and other things on the floor that can make you trip. What can I do with my stairs? Do not leave any items on the stairs.  Make sure that there are handrails on both sides of the stairs and use them. Fix handrails that are broken or loose. Make sure that handrails are as long as the stairways. Check any carpeting to make sure that it is firmly attached to the stairs. Fix any carpet that is loose or worn. Avoid having throw rugs at the top or bottom of the stairs. If you do have throw rugs, attach them to the floor with carpet tape. Make sure that you have a light switch at the top of the stairs and the bottom of the stairs. If you do not have them, ask someone to add them for you. What else can I do to help prevent falls? Wear shoes that: Do not have high heels. Have rubber bottoms. Are comfortable and fit you well. Are closed at the toe. Do not wear sandals. If you use a stepladder: Make sure that it is fully opened. Do not climb a closed  stepladder. Make sure that both sides of the stepladder are locked into place. Ask someone to hold it for you, if possible. Clearly mark and make sure that you can see: Any grab bars or handrails. First and last steps. Where the edge of each step is. Use tools that help you move around (mobility aids) if they are needed. These include: Canes. Walkers. Scooters. Crutches. Turn on the lights when you go into a dark area. Replace any light bulbs as soon as they burn out. Set up your furniture so you have a clear path. Avoid moving your furniture around. If any of your floors are uneven, fix them. If there are any pets around you, be aware of where they are. Review your medicines with your doctor. Some medicines can make you feel dizzy. This can increase your chance of falling. Ask your doctor what other things that you can do to help prevent falls. This information is not intended to replace advice given to you by your health care provider. Make sure you discuss any questions you have with your health care provider. Document Released: 11/08/2008 Document Revised: 06/20/2015 Document Reviewed: 02/16/2014 Elsevier Interactive Patient Education  2017 [employer].

## 2022-09-08 ENCOUNTER — Ambulatory Visit: Payer: Medicare PPO

## 2022-09-15 ENCOUNTER — Ambulatory Visit: Payer: Medicare PPO | Admitting: Physical Therapy

## 2022-09-22 ENCOUNTER — Ambulatory Visit: Payer: Medicare PPO

## 2022-11-17 ENCOUNTER — Other Ambulatory Visit: Payer: Self-pay | Admitting: Family Medicine

## 2022-11-17 DIAGNOSIS — F17201 Nicotine dependence, unspecified, in remission: Secondary | ICD-10-CM

## 2022-11-24 NOTE — Telephone Encounter (Signed)
Pt called to F/U on this refill request, stating she only has about 4 days left.  buPROPion buPROPion (WELLBUTRIN XL) 300 MG 24 hr tablet  [id number] - [place] ([place]), [place] - [address] Phone: [phone]  Fax: [phone]

## 2023-01-07 ENCOUNTER — Other Ambulatory Visit: Payer: Self-pay | Admitting: Family Medicine

## 2023-01-07 DIAGNOSIS — I10 Essential (primary) hypertension: Secondary | ICD-10-CM

## 2023-01-07 NOTE — Telephone Encounter (Signed)
Pt needs an appointment by April -- I will fill this but she needs to schedule

## 2023-01-07 NOTE — Telephone Encounter (Signed)
Left a detailed message with the information below at the patient's cell number. ?

## 2023-01-09 DIAGNOSIS — G479 Sleep disorder, unspecified: Secondary | ICD-10-CM | POA: Diagnosis not present

## 2023-01-09 DIAGNOSIS — N1831 Chronic kidney disease, stage 3a: Secondary | ICD-10-CM | POA: Diagnosis not present

## 2023-01-09 DIAGNOSIS — I739 Peripheral vascular disease, unspecified: Secondary | ICD-10-CM | POA: Diagnosis not present

## 2023-01-09 DIAGNOSIS — I129 Hypertensive chronic kidney disease with stage 1 through stage 4 chronic kidney disease, or unspecified chronic kidney disease: Secondary | ICD-10-CM | POA: Diagnosis not present

## 2023-01-09 DIAGNOSIS — I679 Cerebrovascular disease, unspecified: Secondary | ICD-10-CM | POA: Diagnosis not present

## 2023-01-09 DIAGNOSIS — K219 Gastro-esophageal reflux disease without esophagitis: Secondary | ICD-10-CM | POA: Diagnosis not present

## 2023-01-09 DIAGNOSIS — M1990 Unspecified osteoarthritis, unspecified site: Secondary | ICD-10-CM | POA: Diagnosis not present

## 2023-01-09 DIAGNOSIS — E785 Hyperlipidemia, unspecified: Secondary | ICD-10-CM | POA: Diagnosis not present

## 2023-01-09 DIAGNOSIS — M62838 Other muscle spasm: Secondary | ICD-10-CM | POA: Diagnosis not present

## 2023-03-01 ENCOUNTER — Telehealth: Payer: Self-pay

## 2023-03-01 NOTE — Telephone Encounter (Signed)
Spoke with the patient and advised she contact Dr Ludwig Clarks office (cardiologist) at [phone] as he prescribed Repatha.

## 2023-03-01 NOTE — Telephone Encounter (Signed)
Copied from CRM [id number]. Topic: Clinical - Medication Question >> Mar 01, 2023 12:29 PM Almira Coaster wrote: Reason for CRM: Patient has some concerns regarding her Evolocumab (REPATHA SURECLICK) 140 MG/ML SOAJ, She states that she was suppose to do labs to determine if the medication is working and that someone will reach out to her to schedule the lab appoint; however, it's been sometime now and she has not heard anything back. She would like some advise because if the medication is not working she would like to be off of it.

## 2023-03-08 ENCOUNTER — Encounter: Payer: Self-pay | Admitting: Family Medicine

## 2023-03-08 ENCOUNTER — Ambulatory Visit: Payer: Medicare PPO | Admitting: Family Medicine

## 2023-03-08 VITALS — BP 130/60 | HR 75 | Temp 98.2°F | Ht 61.0 in | Wt 141.8 lb

## 2023-03-08 DIAGNOSIS — F17201 Nicotine dependence, unspecified, in remission: Secondary | ICD-10-CM | POA: Diagnosis not present

## 2023-03-08 DIAGNOSIS — R42 Dizziness and giddiness: Secondary | ICD-10-CM | POA: Diagnosis not present

## 2023-03-08 DIAGNOSIS — Z780 Asymptomatic menopausal state: Secondary | ICD-10-CM | POA: Diagnosis not present

## 2023-03-08 DIAGNOSIS — E782 Mixed hyperlipidemia: Secondary | ICD-10-CM

## 2023-03-08 DIAGNOSIS — M542 Cervicalgia: Secondary | ICD-10-CM

## 2023-03-08 LAB — LDL CHOLESTEROL, DIRECT: Direct LDL: 48 mg/dL

## 2023-03-08 MED ORDER — MECLIZINE HCL 25 MG PO TABS: 25.0000 mg | ORAL_TABLET | Freq: Three times a day (TID) | ORAL | 2 refills | Status: AC | PRN

## 2023-03-08 MED ORDER — TIZANIDINE HCL 4 MG PO TABS: 2.0000 mg | ORAL_TABLET | Freq: Two times a day (BID) | ORAL | 3 refills | Status: AC | PRN

## 2023-03-08 MED ORDER — BUPROPION HCL ER (XL) 300 MG PO TB24: ORAL_TABLET | ORAL | 1 refills | Status: DC

## 2023-03-08 NOTE — Patient Instructions (Signed)
 Ibuprofen 600 mg as needed for jaw pain/locking up.

## 2023-03-08 NOTE — Progress Notes (Signed)
Established Patient Office Visit  Subjective   Patient ID: [name] [name], female    DOB: [birth year]  Age: 78 y.o. MRN: [id number]  Chief Complaint  Patient presents with   Medical Management of Chronic Issues    Pt is here for follow up today, she reports she started on Repatha in August 2024, she reports Dr. Jens Som prescribed it for her. She has been taking it every 2 weeks, isn't sure if it is working or not. Hasn't had bloodwork since before she was started on it. She reports no side effects. Needs new lipid panel today  Vertigo- pt reports that she is still getting dizzy, especially with standing at the sink or turning to the right. States that she needs refills on the meclizine-- takes this every other day, states she is still having a hard time with this.     Current Outpatient Medications  Medication Instructions   aspirin EC 81 mg, Oral, Daily   buPROPion (WELLBUTRIN XL) 300 MG 24 hr tablet TAKE 1 TABLET BY MOUTH ONCE DAILY.   Cholecalciferol (VITAMIN D3) 25 MCG (1000 UT) CAPS 1 capsule, Daily   cyanocobalamin (VITAMIN B12) 1,000 mcg, Daily   lisinopril (ZESTRIL) 20 mg, Oral, Daily, TAKE 1 TABLET BY MOUTH ONCE DAILY . APPOINTMENT REQUIRED FOR FUTURE REFILLS   meclizine (ANTIVERT) 25 mg, Oral, 3 times daily PRN   metoprolol succinate (TOPROL-XL) 50 MG 24 hr tablet TAKE 1 TABLET BY MOUTH ONCE DAILY. TAKE WITH OR IMMEDIATELY FOLLOWING A MEAL.   pantoprazole (PROTONIX) 40 MG tablet TAKE 1 TABLET BY MOUTH ONCE DAILY. APPOINTMENT REQUIRED FOR FUTURE REFILLS.   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     Objective:     BP 130/60   Pulse 75   Temp 98.2 F (36.8 C) (Oral)   Ht 5\' 1"  (1.549 m)   Wt 141 lb 12.8 oz (64.3 kg)   SpO2 99%   BMI 26.79 kg/m    Physical Exam Vitals reviewed.  Constitutional:      Appearance: Normal appearance. She is well-groomed and normal weight.  Eyes:     Conjunctiva/sclera: Conjunctivae normal.  Neck:     Thyroid: No thyromegaly.  Cardiovascular:     Rate and Rhythm: Normal rate and regular rhythm.     Pulses: Normal pulses.     Heart sounds: S1 normal and S2 normal.  Pulmonary:     Effort: Pulmonary effort is normal.     Breath sounds: Normal breath sounds and air entry.  Abdominal:     General: Bowel sounds are normal.  Musculoskeletal:     Right lower leg: No edema.     Left lower leg: No edema.  Neurological:     Mental Status: She is alert and oriented to person, place, and time. Mental status is at baseline.  Gait: Gait is intact.  Psychiatric:        Mood and Affect: Mood and affect normal.        Speech: Speech normal.        Behavior: Behavior normal.        Judgment: Judgment normal.      Results for orders placed or performed in visit on 03/08/23  LDL cholesterol, direct  Result Value Ref Range   Direct LDL 48.0 mg/dL      The ASCVD Risk score (Arnett DK, et al., 2019) failed to calculate for the following reasons:   Risk score cannot be calculated because patient has a medical history suggesting prior/existing ASCVD    Assessment & Plan:  Mixed hyperlipidemia Assessment & Plan: Now on repatha, doing well on this medication, no side effects, needs new lipid panel today  Orders: -     Lipid panel; Future  Tobacco dependence in remission Assessment &  Plan: Doing well, in remission, continue wellbutrin  Orders: -     buPROPion HCl ER (XL); TAKE 1 TABLET BY MOUTH ONCE DAILY.  Dispense: 90 tablet; Refill: 1  Neck pain -     tiZANidine HCl; Take 0.5-1 tablets (2-4 mg total) by mouth 2 (two) times daily as needed for muscle spasms.  Dispense: 60 tablet; Refill: 3  Postmenopausal state -     DG Bone Density; Future  Vertigo Assessment & Plan: Chronic, ongoing, pt is reporting that it is limiting her independence, I had a long discussion with her and I gave her the handouts for the Epley maneuvers she can try at home to see if this will help. Will refill her meclizine also today  Orders: -     Meclizine HCl; Take 1 tablet (25 mg total) by mouth 3 (three) times daily as needed for dizziness.  Dispense: 30 tablet; Refill: 2  Other orders -     LDL cholesterol, direct     Return in about 6 months (around 09/05/2023) for annual physical exam.    Karie Georges, MD

## 2023-03-09 ENCOUNTER — Ambulatory Visit: Payer: Self-pay | Admitting: Family Medicine

## 2023-03-09 NOTE — Telephone Encounter (Addendum)
Patient stated she has a bone density test scheduled for 2/12 but she won't have a vehicle that day. She is requesting to reschedule it for another day. Please assist patient.

## 2023-03-10 ENCOUNTER — Inpatient Hospital Stay: Admission: RE | Admit: 2023-03-10 | Payer: Medicare PPO | Source: Ambulatory Visit

## 2023-03-10 DIAGNOSIS — F17201 Nicotine dependence, unspecified, in remission: Secondary | ICD-10-CM | POA: Insufficient documentation

## 2023-03-10 DIAGNOSIS — R42 Dizziness and giddiness: Secondary | ICD-10-CM | POA: Insufficient documentation

## 2023-03-10 NOTE — Assessment & Plan Note (Signed)
Chronic, ongoing, pt is reporting that it is limiting her independence, I had a long discussion with her and I gave her the handouts for the Epley maneuvers she can try at home to see if this will help. Will refill her meclizine also today

## 2023-03-10 NOTE — Assessment & Plan Note (Signed)
Doing well, in remission, continue wellbutrin

## 2023-03-10 NOTE — Assessment & Plan Note (Signed)
Now on repatha, doing well on this medication, no side effects, needs new lipid panel today

## 2023-03-16 ENCOUNTER — Ambulatory Visit (INDEPENDENT_AMBULATORY_CARE_PROVIDER_SITE_OTHER)
Admission: RE | Admit: 2023-03-16 | Discharge: 2023-03-16 | Disposition: A | Discharge status: Home / Self Care | Payer: Medicare PPO | Source: Ambulatory Visit | Attending: Family Medicine | Admitting: Family Medicine

## 2023-03-16 DIAGNOSIS — Z780 Asymptomatic menopausal state: Secondary | ICD-10-CM

## 2023-03-17 ENCOUNTER — Encounter: Payer: Self-pay | Admitting: Family Medicine

## 2023-04-22 ENCOUNTER — Other Ambulatory Visit: Payer: Self-pay | Admitting: Family Medicine

## 2023-04-22 DIAGNOSIS — I10 Essential (primary) hypertension: Secondary | ICD-10-CM

## 2023-05-04 ENCOUNTER — Other Ambulatory Visit: Payer: Self-pay | Admitting: *Deleted

## 2023-05-04 DIAGNOSIS — I6523 Occlusion and stenosis of bilateral carotid arteries: Secondary | ICD-10-CM

## 2023-05-04 NOTE — Progress Notes (Signed)
 Patient requested to reschedule vas carotid to later date and time.

## 2023-05-07 ENCOUNTER — Other Ambulatory Visit: Payer: Self-pay | Admitting: Cardiology

## 2023-05-07 DIAGNOSIS — I10 Essential (primary) hypertension: Secondary | ICD-10-CM

## 2023-05-11 ENCOUNTER — Ambulatory Visit (HOSPITAL_COMMUNITY)

## 2023-06-03 ENCOUNTER — Ambulatory Visit (HOSPITAL_COMMUNITY)
Admission: RE | Admit: 2023-06-03 | Discharge: 2023-06-03 | Disposition: A | Discharge status: Home / Self Care | Source: Ambulatory Visit | Attending: Cardiology | Admitting: Cardiology

## 2023-06-03 DIAGNOSIS — I6523 Occlusion and stenosis of bilateral carotid arteries: Secondary | ICD-10-CM | POA: Diagnosis not present

## 2023-06-07 ENCOUNTER — Encounter: Payer: Self-pay | Admitting: *Deleted

## 2023-06-12 ENCOUNTER — Other Ambulatory Visit: Payer: Self-pay | Admitting: Cardiology

## 2023-06-12 DIAGNOSIS — I6523 Occlusion and stenosis of bilateral carotid arteries: Secondary | ICD-10-CM

## 2023-06-12 DIAGNOSIS — E7800 Pure hypercholesterolemia, unspecified: Secondary | ICD-10-CM

## 2023-06-12 DIAGNOSIS — E782 Mixed hyperlipidemia: Secondary | ICD-10-CM

## 2023-06-14 ENCOUNTER — Ambulatory Visit: Payer: Self-pay | Admitting: *Deleted

## 2023-06-14 ENCOUNTER — Other Ambulatory Visit: Payer: Self-pay | Admitting: *Deleted

## 2023-06-14 DIAGNOSIS — I6523 Occlusion and stenosis of bilateral carotid arteries: Secondary | ICD-10-CM

## 2023-07-27 NOTE — Progress Notes (Signed)
 HPI: FU cerebrovascular disease. 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 She does not have known CAD and had a normal nuclear study 06/17/2006. Abdominal ultrasound August 2017 showed greater than 50% bilateral common iliac arteries.  No aneurysm.  Monitor August 2020 showed no significant arrhythmias.  Echocardiogram May 2024 showed normal LV function and mild aortic insufficiency.  Carotid Dopplers May 2025 showed 40 to 59% right and 1 to 39% left stenosis.  Since last seen, patient denies dyspnea, chest pain or syncope.  She has bilateral lower extremity pain with ambulation and right greater than left.  Current Outpatient Medications  Medication Sig Dispense Refill   aspirin  EC 81 MG tablet Take 1 tablet (81 mg total) by mouth daily. 90 tablet 3   buPROPion  (WELLBUTRIN  XL) 300 MG 24 hr tablet TAKE 1 TABLET BY MOUTH ONCE DAILY. 90 tablet 1   Cholecalciferol (VITAMIN D3) 25 MCG (1000 UT) CAPS Take 1 capsule by mouth daily.     lisinopril  (ZESTRIL ) 20 MG tablet Take 1 tablet (20 mg total) by mouth daily. 90 tablet 0   meclizine  (ANTIVERT ) 25 MG tablet Take 1 tablet (25 mg total) by mouth 3 (three) times daily as needed for dizziness. 30 tablet 2   metoprolol  succinate (TOPROL -XL) 50 MG 24 hr tablet TAKE 1 TABLET BY MOUTH ONCE DAILY.TAKE WITH OR IMMEDIATELY FOLLOWING A MEAL. 90 tablet 0   pantoprazole  (PROTONIX ) 40 MG tablet TAKE 1 TABLET BY MOUTH ONCE DAILY. APPOINTMENT REQUIRED FOR FUTURE REFILLS. 90 tablet 1   REPATHA  SURECLICK 140 MG/ML SOAJ INJECT 140 MG INTO THE SKIN EVERY 14 DAYS. 6 mL 0   tiZANidine  (ZANAFLEX ) 4 MG tablet Take 0.5-1 tablets (2-4 mg total) by mouth 2 (two) times daily as needed for muscle spasms. 60 tablet 3   traZODone  (DESYREL ) 50 MG tablet TAKE 1/2 TO 1 (ONE-HALF TO ONE) TABLET BY MOUTH AT BEDTIME AS NEEDED 90 tablet 0   vitamin  B-12 (CYANOCOBALAMIN) 1000 MCG tablet Take 1,000 mcg by mouth daily.     No current facility-administered medications for this visit.     Past Medical History:  Diagnosis Date   Aorto-iliac atherosclerosis (HCC)    last aaa duplex 08-30-2015  >50% stenosis bilateral common iliac arteries and left external iliac artery   Arrhythmia    Carotid stenosis, bilateral    cardiologist-- dr pietro (LOV 08-06-2015)  04-29-2017 followed by pcp/  last duplex 03-08-2017 bilateral ICA 40-59% post endarterectomy,  right ECA occluded   Cerebral vascular disease    Chronic gastritis    Elevated cholesterol    Esophageal stricture    Full dentures    GERD (gastroesophageal reflux disease)    Hiatal hernia    History of aortic valve disorder    per cardiology note dated 08-06-2015 by dr pietro pt has hx small fibroblastoma aortic valve per echo 2008 /  last echo 08-23-2015 no fibroblastoma noted or stenosis or thickened/ calcification   History of gastric ulcer 2013   History of nuclear stress test 06-17-2006   per dr pietro note dated 08-06-2015 , normal study   History of transient ischemic attack (TIA) 04/25/2006   severe left ICA stenosis/   04-29-2017 per pt no residual   HLD (hyperlipidemia)    HTN (hypertension)    IBS (irritable bowel syndrome)    OA (osteoarthritis)    Pneumonia  Prolapsed internal hemorrhoids, grade 3    PVD (peripheral vascular disease) (HCC)    Rectal bleed    SYMPTOM, ECCHYMOSES, SPONTANEOUS 06/16/2006   Qualifier: Diagnosis of  By: Tammie MD, Bruce     Vertigo    Wears contact lenses     Past Surgical History:  Procedure Laterality Date   CAROTID ENDARTERECTOMY Bilateral left 04-29-2006 and right 08-17-2007 by  dr c. eliza  [facility]   HEMORRHOID SURGERY N/A 05/07/2017   Procedure: ANAL EXAM UNDER ANESTHESIA, HEMORRHOIDECTOMY;  Surgeon: Debby Hila, MD;  Location: [facility];  Service: General;  Laterality: N/A;   NASAL SINUS SURGERY  age  78   TONSILLECTOMY  age 1   TRANSTHORACIC ECHOCARDIOGRAM  08/23/2015   ef 60-65%, grade 1 diastolic dysfunction/ mild AR and MR   VASCULAR SURGERY Bilateral    carotids   WRIST GANGLION EXCISION Right yrs ago    Social History   Socioeconomic History   Marital status: Widowed    Spouse name: Not on file   Number of children: 0   Years of education: 14   Highest education level: Associate degree: occupational, Scientist, product/process development, or vocational program  Occupational History   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   Social Drivers of Health   Financial Resource Strain: Low Risk  (09/04/2022)   Overall Financial Resource Strain (CARDIA)    Difficulty of Paying Living Expenses: Not hard at all  Food Insecurity: No Food Insecurity (09/04/2022)   Hunger Vital Sign    Worried About Running Out of Food in the Last Year: Never true    Ran Out of Food in the Last Year: Never true  Transportation Needs: No Transportation Needs (09/04/2022)   PRAPARE - Administrator, Civil Service (Medical): No    Lack of Transportation (Non-Medical): No  Physical Activity: Insufficiently Active  (09/04/2022)   Exercise Vital Sign    Days of Exercise per Week: 4 days    Minutes of Exercise per Session: 30 min  Stress: No Stress Concern Present (09/04/2022)   [employer] of Occupational Health - Occupational Stress Questionnaire    Feeling of Stress : Not at all  Social Connections: Moderately Isolated (03/06/2020)   Social Connection and Isolation Panel    Frequency of Communication with Friends and Family: More than three times a week    Frequency of Social Gatherings with Friends and Family: More than three times a week    Attends Religious Services: More than 4 times per year    Active  Member of Clubs or Organizations: No    Attends Banker Meetings: Never    Marital Status: Widowed  Intimate Partner Violence: Not At Risk (09/04/2022)   Humiliation, Afraid, Rape, and Kick questionnaire    Fear of Current or Ex-Partner: No    Emotionally Abused: No    Physically Abused: No    Sexually Abused: No    Family History  Problem Relation Age of Onset   Stroke Father    Diabetes Mother    Asthma Mother    Heart disease Brother    Diabetes Other    Breast cancer Other    Rheum arthritis Sister    Colon cancer Neg Hx     ROS: no fevers or chills, productive cough, hemoptysis, dysphasia, odynophagia, melena, hematochezia, dysuria, hematuria, rash, seizure activity, orthopnea, PND, pedal edema, claudication. Remaining systems are negative.  Physical Exam: Well-developed well-nourished in no acute distress.  Skin is warm and dry.  HEENT is normal.  Neck is supple.  Chest is clear to auscultation with normal expansion.  Cardiovascular exam is regular rate and rhythm.  Abdominal exam nontender or distended. No masses palpated. Extremities show no edema. neuro grossly intact  EKG Interpretation Date/Time:  Tuesday August 03 2023 12:11:25 EDT Ventricular Rate:  74 PR Interval:  202 QRS Duration:  86 QT Interval:  386 QTC Calculation: 428 R Axis:   65  Text  Interpretation: Normal sinus rhythm Normal ECG Confirmed by Pietro Rogue ([id number]) on 08/03/2023 12:16:29 PM    A/P  1 carotid artery disease-plan follow-up carotid Dopplers May 2026.  2 history of aortic valve fibroelastoma-this was not noted on most recent echocardiogram.  Mild aortic insufficiency noted.  Will follow for now.  3 hypertension-blood pressure controlled.  Continue present medical regimen.  4 hyperlipidemia-patient is intolerant to statins.  Continue Repatha .  5 history of peripheral vascular disease-continue aspirin .  She sounds to be having claudication.  Will arrange ABIs with Doppler.  Rogue Pietro, MD

## 2023-08-03 ENCOUNTER — Encounter: Payer: Self-pay | Admitting: Cardiology

## 2023-08-03 ENCOUNTER — Ambulatory Visit: Attending: Cardiology | Admitting: Cardiology

## 2023-08-03 VITALS — BP 142/60 | HR 74 | Ht 61.0 in | Wt 142.0 lb

## 2023-08-03 DIAGNOSIS — I10 Essential (primary) hypertension: Secondary | ICD-10-CM | POA: Diagnosis not present

## 2023-08-03 DIAGNOSIS — I739 Peripheral vascular disease, unspecified: Secondary | ICD-10-CM | POA: Diagnosis not present

## 2023-08-03 DIAGNOSIS — I351 Nonrheumatic aortic (valve) insufficiency: Secondary | ICD-10-CM

## 2023-08-03 DIAGNOSIS — I6523 Occlusion and stenosis of bilateral carotid arteries: Secondary | ICD-10-CM | POA: Diagnosis not present

## 2023-08-03 DIAGNOSIS — E782 Mixed hyperlipidemia: Secondary | ICD-10-CM | POA: Diagnosis not present

## 2023-08-03 NOTE — Patient Instructions (Signed)
   Testing/Procedures:  Your physician has requested that you have an ankle brachial index (ABI). During this test an ultrasound and blood pressure cuff are used to evaluate the arteries that supply the arms and legs with blood. Allow thirty minutes for this exam. There are no restrictions or special instructions.[address]  Please note: We ask at that you not bring children with you during ultrasound (echo/ vascular) testing. Due to room size and safety concerns, children are not allowed in the ultrasound rooms during exams. Our front office staff cannot provide observation of children in our lobby area while testing is being conducted. An adult accompanying a patient to their appointment will only be allowed in the ultrasound room at the discretion of the ultrasound technician under special circumstances. We apologize for any inconvenience.   Your physician has requested that you have a lower extremity arterial exercise duplex. During this test, exercise and ultrasound are used to evaluate arterial blood flow in the legs. Allow one hour for this exam. There are no restrictions or special instructions. [address]  Follow-Up: At [facility], you and your health needs are our priority.  As part of our continuing mission to provide you with exceptional heart care, our providers are all part of one team.  This team includes your primary Cardiologist (physician) and Advanced Practice Providers or APPs (Physician Assistants and Nurse Practitioners) who all work together to provide you with the care you need, when you need it.  Your next appointment:   12 month(s)  Provider:   REDELL SHALLOW MD

## 2023-08-10 ENCOUNTER — Other Ambulatory Visit: Payer: Self-pay | Admitting: Cardiology

## 2023-08-10 DIAGNOSIS — I10 Essential (primary) hypertension: Secondary | ICD-10-CM

## 2023-08-26 ENCOUNTER — Other Ambulatory Visit: Payer: Self-pay | Admitting: Family Medicine

## 2023-09-04 ENCOUNTER — Other Ambulatory Visit: Payer: Self-pay | Admitting: Cardiology

## 2023-09-04 DIAGNOSIS — E782 Mixed hyperlipidemia: Secondary | ICD-10-CM

## 2023-09-04 DIAGNOSIS — E7800 Pure hypercholesterolemia, unspecified: Secondary | ICD-10-CM

## 2023-09-04 DIAGNOSIS — I6523 Occlusion and stenosis of bilateral carotid arteries: Secondary | ICD-10-CM

## 2023-09-09 ENCOUNTER — Telehealth: Payer: Self-pay | Admitting: Cardiology

## 2023-09-09 NOTE — Telephone Encounter (Signed)
  Pt c/o medication issue:  1. Name of Medication: Evolocumab  (REPATHA  SURECLICK) 140 MG/ML SOAJ   2. How are you currently taking this medication (dosage and times per day)?   INJECT CONTENTS OF 1 PEN SUBCUTANEOUSLY ONCE EVERY 14 DAYS    3. Are you having a reaction (difficulty breathing--STAT)?  na  4. What is your medication issue? Pharmacy told her she will have to pay $80 for prescription unless she can get a new card for a grant to help with the cost. She says pharmacist told her that the coupon she had expired  Call back today will go to home number If tomorrow please call her cell phone

## 2023-09-10 ENCOUNTER — Other Ambulatory Visit (HOSPITAL_COMMUNITY): Payer: Self-pay

## 2023-09-10 ENCOUNTER — Telehealth: Payer: Self-pay | Admitting: Pharmacy Technician

## 2023-09-10 ENCOUNTER — Ambulatory Visit: Payer: Self-pay | Admitting: Cardiology

## 2023-09-10 ENCOUNTER — Encounter (HOSPITAL_COMMUNITY): Payer: Self-pay

## 2023-09-10 ENCOUNTER — Ambulatory Visit (HOSPITAL_COMMUNITY)
Admission: RE | Admit: 2023-09-10 | Discharge: 2023-09-10 | Disposition: A | Discharge status: Home / Self Care | Source: Ambulatory Visit | Attending: Cardiology | Admitting: Cardiology

## 2023-09-10 ENCOUNTER — Ambulatory Visit (HOSPITAL_COMMUNITY): Admission: RE | Admit: 2023-09-10 | Source: Ambulatory Visit

## 2023-09-10 DIAGNOSIS — I739 Peripheral vascular disease, unspecified: Secondary | ICD-10-CM | POA: Diagnosis not present

## 2023-09-10 LAB — VAS US ABI WITH/WO TBI
Left ABI: 0.95
Right ABI: 0.98

## 2023-09-10 NOTE — Telephone Encounter (Signed)
 Patient Advocate Encounter   The patient was approved for a [employer] grant that will help cover the cost of Repatha  Total amount awarded, 2500.00.   Effective: 08/11/23 - 08/09/24   APW:[id number] ERW:[id number] Hmnle:[id number] PI:[id number]  [employer] ID: [id number]   Pharmacy provided with approval and processing information. Patient informed via my chart

## 2023-09-11 ENCOUNTER — Other Ambulatory Visit: Payer: Self-pay | Admitting: Family Medicine

## 2023-09-11 DIAGNOSIS — I10 Essential (primary) hypertension: Secondary | ICD-10-CM

## 2023-09-12 ENCOUNTER — Other Ambulatory Visit: Payer: Self-pay | Admitting: Cardiology

## 2023-09-12 DIAGNOSIS — E782 Mixed hyperlipidemia: Secondary | ICD-10-CM

## 2023-09-12 DIAGNOSIS — E7800 Pure hypercholesterolemia, unspecified: Secondary | ICD-10-CM

## 2023-09-12 DIAGNOSIS — I6523 Occlusion and stenosis of bilateral carotid arteries: Secondary | ICD-10-CM

## 2023-09-15 ENCOUNTER — Ambulatory Visit (INDEPENDENT_AMBULATORY_CARE_PROVIDER_SITE_OTHER)

## 2023-09-15 VITALS — Ht 61.0 in | Wt 142.0 lb

## 2023-09-15 DIAGNOSIS — Z0000 Encounter for general adult medical examination without abnormal findings: Secondary | ICD-10-CM | POA: Diagnosis not present

## 2023-09-15 NOTE — Progress Notes (Signed)
 Subjective:   [name] [name] is a 78 y.o. who presents for a Medicare Wellness preventive visit.  As a reminder, Annual Wellness Visits don't include a physical exam, and some assessments may be limited, especially if this visit is performed virtually. We may recommend an in-person follow-up visit with your provider if needed.  Visit Complete: Virtual I connected with  [name] [name] on 09/15/23 by a audio enabled telemedicine application and verified that I am speaking with the correct person using two identifiers.  Patient Location: Home  Provider Location: Home Office  I discussed the limitations of evaluation and management by telemedicine. The patient expressed understanding and agreed to proceed.  Vital Signs: Because this visit was a virtual/telehealth visit, some criteria may be missing or patient reported. Any vitals not documented were not able to be obtained and vitals that have been documented are patient reported.    Persons Participating in Visit: Patient.  AWV Questionnaire: No: Patient Medicare AWV questionnaire was not completed prior to this visit.  Cardiac Risk Factors include: advanced age (>32men, >71 women);hypertension     Objective:    Today's Vitals   09/15/23 1043  Weight: 142 lb (64.4 kg)  Height: 5' 1 (1.549 m)   Body mass index is 26.83 kg/m.     09/15/2023   10:50 AM 09/04/2022    2:51 PM 03/21/2022   11:00 PM 05/08/2021    2:03 PM 03/31/2021    3:20 PM 03/06/2020   11:42 AM 03/25/2019    9:32 AM  Advanced Directives  Does Patient Have a Medical Advance Directive? Yes Yes Yes Yes Yes Yes Yes  Type of Estate agent of [place];Living will Healthcare Power of [place];Living will Healthcare Power of [place];Living will Living will;Healthcare Power of [employer] Power of [place];Living will Healthcare Power of [place];Living will Healthcare Power of [place];Living will  Does patient want to make changes to medical  advance directive?    No - Patient declined  No - Patient declined   Copy of Healthcare Power of Attorney in Chart? No - copy requested No - copy requested  No - copy requested No - copy requested No - copy requested No - copy requested    Current Medications (verified) Outpatient Encounter Medications as of 09/15/2023  Medication Sig   aspirin  EC 81 MG tablet Take 1 tablet (81 mg total) by mouth daily.   buPROPion  (WELLBUTRIN  XL) 300 MG 24 hr tablet TAKE 1 TABLET BY MOUTH ONCE DAILY.   Cholecalciferol (VITAMIN D3) 25 MCG (1000 UT) CAPS Take 1 capsule by mouth daily.   Evolocumab  (REPATHA  SURECLICK) 140 MG/ML SOAJ INJECT CONTENTS OF 1 PEN SUBCUTANEOUSLY ONCE EVERY 14 DAYS   lisinopril  (ZESTRIL ) 20 MG tablet Take 1 tablet (20 mg total) by mouth daily.   meclizine  (ANTIVERT ) 25 MG tablet Take 1 tablet (25 mg total) by mouth 3 (three) times daily as needed for dizziness.   metoprolol  succinate (TOPROL -XL) 50 MG 24 hr tablet TAKE 1 TABLET BY MOUTH ONCE DAILY WITH MEAL OR  IMMEDIATELY  FOLLOWING  A  MEAL.   pantoprazole  (PROTONIX ) 40 MG tablet TAKE 1 TABLET BY MOUTH ONCE DAILY. APPOINTMENT REQUIRED FOR FUTURE REFILLS.   tiZANidine  (ZANAFLEX ) 4 MG tablet Take 0.5-1 tablets (2-4 mg total) by mouth 2 (two) times daily as needed for muscle spasms.   traZODone  (DESYREL ) 50 MG tablet TAKE 1/2 TO 1 TABLET BY MOUTH AT BEDTIME AS NEEDED   vitamin B-12 (CYANOCOBALAMIN) 1000 MCG tablet Take 1,000  mcg by mouth daily.   No facility-administered encounter medications on file as of 09/15/2023.    Allergies (verified) Statins and Sulfa antibiotics   History: Past Medical History:  Diagnosis Date   Aorto-iliac atherosclerosis (HCC)    last aaa duplex 08-30-2015  >50% stenosis bilateral common iliac arteries and left external iliac artery   Arrhythmia    Carotid stenosis, bilateral    cardiologist-- dr pietro (LOV 08-06-2015)  04-29-2017 followed by pcp/  last duplex 03-08-2017 bilateral ICA 40-59% post  endarterectomy,  right ECA occluded   Cerebral vascular disease    Chronic gastritis    Elevated cholesterol    Esophageal stricture    Full dentures    GERD (gastroesophageal reflux disease)    Hiatal hernia    History of aortic valve disorder    per cardiology note dated 08-06-2015 by dr pietro pt has hx small fibroblastoma aortic valve per echo 2008 /  last echo 08-23-2015 no fibroblastoma noted or stenosis or thickened/ calcification   History of gastric ulcer 2013   History of nuclear stress test 06-17-2006   per dr pietro note dated 08-06-2015 , normal study   History of transient ischemic attack (TIA) 04/25/2006   severe left ICA stenosis/   04-29-2017 per pt no residual   HLD (hyperlipidemia)    HTN (hypertension)    IBS (irritable bowel syndrome)    OA (osteoarthritis)    Pneumonia    Prolapsed internal hemorrhoids, grade 3    PVD (peripheral vascular disease) (HCC)    Rectal bleed    SYMPTOM, ECCHYMOSES, SPONTANEOUS 06/16/2006   Qualifier: Diagnosis of  By: Tammie MD, Bruce     Vertigo    Wears contact lenses    Past Surgical History:  Procedure Laterality Date   CAROTID ENDARTERECTOMY Bilateral left 04-29-2006 and right 08-17-2007 by  dr c. eliza  [facility]   HEMORRHOID SURGERY N/A 05/07/2017   Procedure: ANAL EXAM UNDER ANESTHESIA, HEMORRHOIDECTOMY;  Surgeon: Debby Hila, MD;  Location: [facility] [place];  Service: General;  Laterality: N/A;   NASAL SINUS SURGERY  age 29   TONSILLECTOMY  age 35   TRANSTHORACIC ECHOCARDIOGRAM  08/23/2015   ef 60-65%, grade 1 diastolic dysfunction/ mild AR and MR   VASCULAR SURGERY Bilateral    carotids   WRIST GANGLION EXCISION Right yrs ago   Family History  Problem Relation Age of Onset   Stroke Father    Diabetes Mother    Asthma Mother    Heart disease Brother    Diabetes Other    Breast cancer Other    Rheum arthritis Sister    Colon cancer Neg Hx    Social History   Socioeconomic History   Marital  status: Widowed    Spouse name: Not on file   Number of children: 0   Years of education: 14   Highest education level: Associate degree: occupational, Scientist, product/process development, or vocational program  Occupational History   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   Social Drivers of Health   Financial Resource Strain: Low Risk  (09/15/2023)   Overall Financial Resource Strain (CARDIA)    Difficulty of Paying Living Expenses: Not hard at all  Food Insecurity: No Food Insecurity (09/15/2023)   Hunger Vital Sign    Worried About Running Out of Food in the Last Year: Never true    Ran Out of Food in the Last Year: Never true  Transportation Needs: No Transportation Needs (09/15/2023)   PRAPARE - Administrator, Civil Service (Medical): No    Lack of Transportation (Non-Medical): No  Physical Activity: Unknown (09/15/2023)   Exercise Vital Sign    Days of Exercise per Week: 3 days    Minutes of Exercise per Session: Not on file  Stress: No Stress Concern Present (09/15/2023)   [employer] of Occupational Health - Occupational Stress Questionnaire    Feeling of Stress: Not at all   Social Connections: Moderately Integrated (09/15/2023)   Social Connection and Isolation Panel    Frequency of Communication with Friends and Family: More than three times a week    Frequency of Social Gatherings with Friends and Family: More than three times a week    Attends Religious Services: More than 4 times per year    Active Member of [employer] or Organizations: Yes    Attends Banker Meetings: More than 4 times per year    Marital Status: Widowed    Tobacco Counseling Counseling given: Not Answered    Clinical Intake:  Pre-visit preparation completed: Yes  Pain : No/denies pain     BMI - recorded: 26.83 Nutritional Status: BMI 25 -29 Overweight Nutritional Risks: None Diabetes: No  Lab Results  Component Value Date   HGBA1C 6.3 (H) 05/13/2022   HGBA1C 6.5 12/23/2021   HGBA1C 6.3 04/28/2021     How often do you need to have someone help you when you read instructions, pamphlets, or other written materials from your doctor or pharmacy?: 1 - Never  Interpreter Needed?: No  Information entered by :: Rojelio Blush LPN   Activities of Daily Living      09/15/2023   10:49 AM  In your present state of health, do you have any difficulty performing the following activities:  Hearing? 0  Vision? 0  Difficulty concentrating or making decisions? 0  Walking or climbing stairs? 0  Dressing or bathing? 0  Doing errands, shopping? 0  Preparing Food and eating ? N  Using the Toilet? N  In the past six months, have you accidently leaked urine? N  Do you have problems with loss of bowel control? N  Managing your Medications? N  Managing your Finances? N  Housekeeping or managing your Housekeeping? N    Patient Care Team: Ozell Heron HERO, MD as PCP - General (Family Medicine) Pietro Redell RAMAN, MD as PCP - Cardiology (Cardiology) Pietro Redell RAMAN, MD as Consulting Physician (Cardiology) [facility] PAR[facility] (Ophthalmology)   I have updated your Care  Teams any recent Medical Services you may  have received from other providers in the past year.     Assessment:   This is a routine wellness examination for [name] [name].  Hearing/Vision screen Hearing Screening - Comments:: Denies hearing difficulties   Vision Screening - Comments:: Wears rx glasses - up to date with routine eye exams with  [facility]   Goals Addressed               This Visit's Progress     Increase physical activity (pt-stated)        Remain active       Depression Screen      09/15/2023   10:48 AM 09/04/2022    2:46 PM 06/01/2022   10:31 AM 12/23/2021    9:26 AM 03/31/2021    3:23 PM 01/15/2021   11:58 AM 03/06/2020   11:49 AM  PHQ 2/9 Scores  PHQ - 2 Score 0 0 2 1 0 0 0  PHQ- 9 Score   7 6  0     Fall Risk      09/15/2023   10:49 AM 09/04/2022    2:47 PM 03/31/2021    3:21 PM 03/06/2020   11:47 AM 03/06/2019   11:49 AM  Fall Risk   Falls in the past year? 1 1 1 1  0   Comment   slipped on the wet floor    Number falls in past yr: 0 0 0 0   Injury with Fall? 0 1 0 0   Comment  Hematoma to forehead and bruises. Followed by medical attention     Risk for fall due to : No Fall Risks No Fall Risks;Impaired balance/gait Medication side effect No Fall Risks   Follow up Falls evaluation completed Falls prevention discussed Falls evaluation completed;Education provided;Falls prevention discussed  Falls evaluation completed;Falls prevention discussed  Falls evaluation completed;Education provided;Falls prevention discussed      Data saved with a previous flowsheet row definition    MEDICARE RISK AT HOME:   Medicare Risk at Home Any stairs in or around the home?: Yes If so, are there any without handrails?: No Home free of loose throw rugs in walkways, pet beds, electrical cords, etc?: Yes Adequate lighting in your home to reduce risk of falls?: Yes Life alert?: Yes Use of a cane, walker or w/c?: No Grab bars in the bathroom?: No Shower chair or bench in  shower?: Yes Elevated toilet seat or a handicapped toilet?: No  TIMED UP AND GO:  Was the test performed?  No  Cognitive Function: 6CIT completed        09/15/2023   10:50 AM 09/04/2022    2:51 PM 03/31/2021    3:25 PM 03/06/2019   11:51 AM  6CIT Screen  What Year? 0 points 0 points 0 points 0 points  What month? 0 points 0 points 0 points 0 points  What time? 0 points 0 points 0 points 0 points  Count back from 20 0 points 0 points 0 points 0 points  Months in reverse 0 points 0 points 0 points 0 points  Repeat phrase 0 points 0 points 2 points 0 points  Total Score 0 points 0 points 2 points 0 points    Immunizations Immunization History  Administered Date(s) Administered   Fluad Quad(high Dose 65+) 01/15/2021, 12/23/2021   Influenza-Unspecified 01/26/2018, 12/27/2019   PFIZER(Purple Top)SARS-COV-2 Vaccination 01/04/2020, 01/25/2020   Pneumococcal Conjugate-13 12/11/2013, 03/02/2018   Pneumococcal Polysaccharide-23 01/24/2015   Tdap 01/26/2009   Zoster Recombinant(Shingrix) 03/02/2018, 09/02/2018  Screening Tests Health Maintenance  Topic Date Due   DTaP/Tdap/Td (2 - Td or Tdap) 01/27/2019   COVID-19 Vaccine (3 - 2024-25 season) 09/27/2022   INFLUENZA VACCINE  08/27/2023   Medicare Annual Wellness (AWV)  09/14/2024   Pneumococcal Vaccine: 50+ Years  Completed   DEXA SCAN  Completed   Hepatitis C Screening  Completed   Zoster Vaccines- Shingrix  Completed   HPV VACCINES  Aged Out   Meningococcal B Vaccine  Aged Out   MAMMOGRAM  Discontinued   Colonoscopy  Discontinued    Health Maintenance  Health Maintenance Due  Topic Date Due   DTaP/Tdap/Td (2 - Td or Tdap) 01/27/2019   COVID-19 Vaccine (3 - 2024-25 season) 09/27/2022   INFLUENZA VACCINE  08/27/2023   Health Maintenance Items Addressed:   Additional Screening:  Vision Screening: Recommended annual ophthalmology exams for early detection of glaucoma and other disorders of the eye. Would you like a  referral to an eye doctor? No    Dental Screening: Recommended annual dental exams for proper oral hygiene  Community Resource Referral / Chronic Care Management: CRR required this visit?  No   CCM required this visit?  No   Plan:    I have personally reviewed and noted the following in the patient's chart:   Medical and social history Use of alcohol, tobacco or illicit drugs  Current medications and supplements including opioid prescriptions. Patient is not currently taking opioid prescriptions. Functional ability and status Nutritional status Physical activity Advanced directives List of other physicians Hospitalizations, surgeries, and ER visits in previous 12 months Vitals Screenings to include cognitive, depression, and falls Referrals and appointments  In addition, I have reviewed and discussed with patient certain preventive protocols, quality metrics, and best practice recommendations. A written personalized care plan for preventive services as well as general preventive health recommendations were provided to patient.   Rojelio LELON Blush, LPN   1/79/7974   After Visit Summary: (MyChart) Due to this being a telephonic visit, the after visit summary with patients personalized plan was offered to patient via MyChart   Notes: Nothing significant to report at this time.

## 2023-09-15 NOTE — Patient Instructions (Addendum)
 Ms. [name] [name] , Thank you for taking time out of your busy schedule to complete your Annual Wellness Visit with me. I enjoyed our conversation and look forward to speaking with you again next year. I, as well as your care team,  appreciate your ongoing commitment to your health goals. Please review the following plan we discussed and let me know if I can assist you in the future. Your Game plan/ To Do List    Referrals: If you haven't heard from the office you've been referred to, please reach out to them at the phone provided.   Follow up Visits: We will see or speak with you next year for your Next Medicare AWV with our clinical staff 09/20/24 @ 10:40a Have you seen your provider in the last 6 months (3 months if uncontrolled diabetes)?   Clinician Recommendations:  Aim for 30 minutes of exercise or brisk walking, 6-8 glasses of water, and 5 servings of fruits and vegetables each day.        This is a list of the screenings recommended for you:  Health Maintenance  Topic Date Due   DTaP/Tdap/Td vaccine (2 - Td or Tdap) 01/27/2019   COVID-19 Vaccine (3 - 2024-25 season) 09/27/2022   Flu Shot  08/27/2023   Medicare Annual Wellness Visit  09/14/2024   Pneumococcal Vaccine for age over 105  Completed   DEXA scan (bone density measurement)  Completed   Hepatitis C Screening  Completed   Zoster (Shingles) Vaccine  Completed   HPV Vaccine  Aged Out   Meningitis B Vaccine  Aged Out   Mammogram  Discontinued   Colon Cancer Screening  Discontinued    Advanced directives: (Copy Requested) Please bring a copy of your health care power of attorney and living will to the office to be added to your chart at your convenience. You can mail to [facility] [address] [place], [place] [address] or email to [email or web address] Advance Care Planning is important because it:  [x]  Makes sure you receive the medical care that is consistent with your values, goals, and  preferences  [x]  It provides guidance to your family and loved ones and reduces their decisional burden about whether or not they are making the right decisions based on your wishes.  Follow the link provided in your after visit summary or read over the paperwork we have mailed to you to help you started getting your Advance Directives in place. If you need assistance in completing these, please reach out to us  so that we can help you!  See attachments for Preventive Care and Fall Prevention Tips.

## 2023-09-24 ENCOUNTER — Other Ambulatory Visit: Payer: Self-pay | Admitting: Family Medicine

## 2023-09-24 DIAGNOSIS — F17201 Nicotine dependence, unspecified, in remission: Secondary | ICD-10-CM

## 2023-11-02 ENCOUNTER — Ambulatory Visit: Payer: Self-pay

## 2023-11-02 NOTE — Telephone Encounter (Signed)
 Noted- ok to close.

## 2023-11-02 NOTE — Telephone Encounter (Signed)
 Appt on 10/8 with [facility].

## 2023-11-02 NOTE — Telephone Encounter (Signed)
 FYI Only or Action Required?: FYI only for provider.  Patient was last seen in primary care on 03/08/2023 by Ozell Heron HERO, MD.  Called Nurse Triage reporting Urinary Frequency.  Symptoms began a week ago.  Interventions attempted: Nothing.  Symptoms are: stable.  Triage Disposition: See Physician Within 24 Hours  Patient/caregiver understands and will follow disposition?: Yes Reason for Disposition  Age > 50 years  Answer Assessment - Initial Assessment Questions Patient also c/o frequent bruising, and does not know where they are coming from. Flared up about maybe a year ago and went away and they appeared again recently and won't go away.   1. SEVERITY: How bad is the pain?  (e.g., Scale 1-10; mild, moderate, or severe)     Moderate  2. FREQUENCY: How many times have you had painful urination today?      Burning every time   3. PATTERN: Is pain present every time you urinate or just sometimes?      Every time  4. ONSET: When did the painful urination start?      A week ago  5. FEVER: Do you have a fever? If Yes, ask: What is your temperature, how was it measured, and when did it start?     Had a fever on Saturday 101  6. PAST UTI: Have you had a urine infection before? If Yes, ask: When was the last time? and What happened that time?      Denies getting them frequently, last one maybe 5 years ago   7. CAUSE: What do you think is causing the painful urination?  (e.g., UTI, scratch, Herpes sore)     UTI  8. OTHER SYMPTOMS: Do you have any other symptoms? (e.g., blood in urine, flank pain, genital sores, urgency, vaginal discharge)     Denies  Protocols used: Urination Pain - Female-A-AH  Copied from CRM #[id number]. Topic: Clinical - Red Word Triage >> Nov 02, 2023  1:40 PM Shardie S wrote: [facility] that prompted transfer to Nurse Triage: pain and burning with urination     ----------------------------------------------------------------------- From previous Reason for Contact - Scheduling: Patient/patient representative is calling to schedule an appointment. Refer to attachments for appointment information.

## 2023-11-03 ENCOUNTER — Ambulatory Visit: Admitting: Adult Health

## 2023-11-03 VITALS — BP 120/70 | HR 85 | Temp 98.0°F | Ht 61.0 in | Wt 141.0 lb

## 2023-11-03 DIAGNOSIS — R300 Dysuria: Secondary | ICD-10-CM

## 2023-11-03 LAB — POCT URINALYSIS DIPSTICK
Bilirubin, UA: POSITIVE
Blood, UA: NEGATIVE
Glucose, UA: NEGATIVE
Ketones, UA: NEGATIVE
Leukocytes, UA: NEGATIVE
Nitrite, UA: NEGATIVE
Protein, UA: NEGATIVE
Spec Grav, UA: 1.025 (ref 1.010–1.025)
Urobilinogen, UA: 0.2 U/dL
pH, UA: 5.5 (ref 5.0–8.0)

## 2023-11-03 NOTE — Progress Notes (Signed)
 Subjective:    Patient ID: [name] [name], female    DOB: November 24, 1945, 78 y.o.   MRN: [id number]  Dysuria    78 year old female who  has a past medical history of Aorto-iliac atherosclerosis, Arrhythmia, Carotid stenosis, bilateral, Cerebral vascular disease, Chronic gastritis, Elevated cholesterol, Esophageal stricture, Full dentures, GERD (gastroesophageal reflux disease), Hiatal hernia, History of aortic valve disorder, History of gastric ulcer (2013), History of nuclear stress test (06-17-2006   per dr pietro note dated 08-06-2015 , normal study), History of transient ischemic attack (TIA) (04/25/2006), HLD (hyperlipidemia), HTN (hypertension), IBS (irritable bowel syndrome), OA (osteoarthritis), Pneumonia, Prolapsed internal hemorrhoids, grade 3, PVD (peripheral vascular disease), Rectal bleed, SYMPTOM, ECCHYMOSES, SPONTANEOUS (06/16/2006), Vertigo, and Wears contact lenses.  She is a patient of Dr. Ozell who I am seeing today for an acute visit   She reports that over the last two weeks she has been experiencing intermittent dysuria. Reports that she does not have burning every time but more often then not. She does not drink a lot of caffeine. She denies rash or vaginal discharge. She tries to stay hydrated and has been drinking a lot of water lately  my urine is usually clear. She denies hematuria, urgency or incontinence.    Review of Systems  Genitourinary:  Positive for dysuria.   See HPI   Past Medical History:  Diagnosis Date   Aorto-iliac atherosclerosis    last aaa duplex 08-30-2015  >50% stenosis bilateral common iliac arteries and left external iliac artery   Arrhythmia    Carotid stenosis, bilateral    cardiologist-- dr pietro (LOV 08-06-2015)  04-29-2017 followed by pcp/  last duplex 03-08-2017 bilateral ICA 40-59% post endarterectomy,  right ECA occluded   Cerebral vascular disease    Chronic gastritis    Elevated cholesterol    Esophageal stricture    Full  dentures    GERD (gastroesophageal reflux disease)    Hiatal hernia    History of aortic valve disorder    per cardiology note dated 08-06-2015 by dr pietro pt has hx small fibroblastoma aortic valve per echo 2008 /  last echo 08-23-2015 no fibroblastoma noted or stenosis or thickened/ calcification   History of gastric ulcer 2013   History of nuclear stress test 06-17-2006   per dr pietro note dated 08-06-2015 , normal study   History of transient ischemic attack (TIA) 04/25/2006   severe left ICA stenosis/   04-29-2017 per pt no residual   HLD (hyperlipidemia)    HTN (hypertension)    IBS (irritable bowel syndrome)    OA (osteoarthritis)    Pneumonia    Prolapsed internal hemorrhoids, grade 3    PVD (peripheral vascular disease)    Rectal bleed    SYMPTOM, ECCHYMOSES, SPONTANEOUS 06/16/2006   Qualifier: Diagnosis of  By: Tammie MD, Bruce     Vertigo    Wears contact lenses     Social History   Socioeconomic History   Marital status: Widowed    Spouse name: Not on file   Number of children: 0   Years of education: 14   Highest education level: Some college, no degree  Occupational History   Occupation: [employer]    Comment: retired  Tobacco Use   Smoking status: Former    Current packs/day: 0.00    Average packs/day: 0.5 packs/day for 30.0 years (15.0 ttl pk-yrs)    Types: Cigarettes    Start date: 04/03/1982    Quit date: 04/02/2012  Years since quitting: 11.5   Smokeless tobacco: Never  Vaping Use   Vaping status: Former  Substance and Sexual Activity   Alcohol use: No   Drug use: No   Sexual activity: Not on file  Other Topics Concern   Not on file  Social History Narrative    widow   Worked for [employer].       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[employer], substitute for accounting   Religion important part of her life   Has three living siblings, local, good  support Games developer at nursing facilities with hospice   Social Drivers of Health   Financial Resource Strain: Low Risk  (11/03/2023)   Overall Financial Resource Strain (CARDIA)    Difficulty of Paying Living Expenses: Not very hard  Food Insecurity: No Food Insecurity (11/03/2023)   Hunger Vital Sign    Worried About Running Out of Food in the Last Year: Never true    Ran Out of Food in the Last Year: Never true  Transportation Needs: No Transportation Needs (11/03/2023)   PRAPARE - Administrator, Civil Service (Medical): No    Lack of Transportation (Non-Medical): No  Physical Activity: Insufficiently Active (11/03/2023)   Exercise Vital Sign    Days of Exercise per Week: 4 days    Minutes of Exercise per Session: 30 min  Stress: No Stress Concern Present (11/03/2023)   [employer] of Occupational Health - Occupational Stress Questionnaire    Feeling of Stress: Only a little  Social Connections: Moderately Integrated (11/03/2023)   Social Connection and Isolation Panel    Frequency of Communication with Friends and Family: Twice a week    Frequency of Social Gatherings with Friends and Family: Twice a week    Attends Religious Services: More than 4 times per year    Active Member of [employer] or Organizations: Yes    Attends Banker Meetings: 1 to 4 times per year    Marital Status: Widowed  Intimate Partner Violence: Not At Risk (09/15/2023)   Humiliation, Afraid, Rape, and Kick questionnaire    Fear of Current or Ex-Partner: No    Emotionally Abused: No    Physically Abused: No    Sexually Abused: No    Past Surgical History:  Procedure Laterality Date   CAROTID ENDARTERECTOMY Bilateral left 04-29-2006 and right 08-17-2007 by  dr c. eliza  [facility]   HEMORRHOID SURGERY N/A 05/07/2017   Procedure: ANAL EXAM UNDER ANESTHESIA, HEMORRHOIDECTOMY;  Surgeon: Debby Hila, MD;  Location: [facility] [place];  Service: General;  Laterality: N/A;    NASAL SINUS SURGERY  age 53   TONSILLECTOMY  age 67   TRANSTHORACIC ECHOCARDIOGRAM  08/23/2015   ef 60-65%, grade 1 diastolic dysfunction/ mild AR and MR   VASCULAR SURGERY Bilateral    carotids   WRIST GANGLION EXCISION Right yrs ago    Family History  Problem Relation Age of Onset   Stroke Father    Diabetes Mother    Asthma Mother    Heart disease Brother    Diabetes Other    Breast cancer Other    Rheum arthritis Sister    Colon cancer Neg Hx     Allergies  Allergen Reactions   Statins Other (See Comments)    Had severe muscle weakness and cramps with statins   Sulfa Antibiotics Swelling    Current Outpatient Medications on File Prior  to Visit  Medication Sig Dispense Refill   aspirin  EC 81 MG tablet Take 1 tablet (81 mg total) by mouth daily. 90 tablet 3   buPROPion  (WELLBUTRIN  XL) 300 MG 24 hr tablet Take 1 tablet by mouth once daily 90 tablet 0   Cholecalciferol (VITAMIN D3) 25 MCG (1000 UT) CAPS Take 1 capsule by mouth daily.     Evolocumab  (REPATHA  SURECLICK) 140 MG/ML SOAJ INJECT CONTENTS OF 1 PEN SUBCUTANEOUSLY ONCE EVERY 14 DAYS 6 mL 3   lisinopril  (ZESTRIL ) 20 MG tablet Take 1 tablet (20 mg total) by mouth daily. 90 tablet 3   meclizine  (ANTIVERT ) 25 MG tablet Take 1 tablet (25 mg total) by mouth 3 (three) times daily as needed for dizziness. 30 tablet 2   metoprolol  succinate (TOPROL -XL) 50 MG 24 hr tablet TAKE 1 TABLET BY MOUTH ONCE DAILY WITH MEAL OR  IMMEDIATELY  FOLLOWING  A  MEAL. 90 tablet 0   pantoprazole  (PROTONIX ) 40 MG tablet TAKE 1 TABLET BY MOUTH ONCE DAILY. APPOINTMENT REQUIRED FOR FUTURE REFILLS. 90 tablet 1   tiZANidine  (ZANAFLEX ) 4 MG tablet Take 0.5-1 tablets (2-4 mg total) by mouth 2 (two) times daily as needed for muscle spasms. 60 tablet 3   traZODone  (DESYREL ) 50 MG tablet TAKE 1/2 TO 1 TABLET BY MOUTH AT BEDTIME AS NEEDED 90 tablet 1   vitamin B-12 (CYANOCOBALAMIN) 1000 MCG tablet Take 1,000 mcg by mouth daily.     No current  facility-administered medications on file prior to visit.    BP 120/70   Pulse 85   Temp 98 F (36.7 C) (Oral)   Ht 5' 1 (1.549 m)   Wt 141 lb (64 kg)   SpO2 97%   BMI 26.64 kg/m       Objective:   Physical Exam Vitals and nursing note reviewed.  Constitutional:      Appearance: Normal appearance.  Cardiovascular:     Rate and Rhythm: Normal rate and regular rhythm.     Pulses: Normal pulses.     Heart sounds: Normal heart sounds.  Pulmonary:     Effort: Pulmonary effort is normal.     Breath sounds: Normal breath sounds.  Abdominal:     General: Abdomen is flat. Bowel sounds are normal. There is no distension.     Palpations: Abdomen is soft.     Tenderness: There is abdominal tenderness (chronic RLQ). There is no right CVA tenderness or left CVA tenderness.  Skin:    General: Skin is warm and dry.  Neurological:     General: No focal deficit present.     Mental Status: She is alert and oriented to person, place, and time.  Psychiatric:        Mood and Affect: Mood normal.        Behavior: Behavior normal.        Thought Content: Thought content normal.        Judgment: Judgment normal.        Assessment & Plan:  1. Dysuria (Primary)  - POC Urinalysis Dipstick- negative  - Possibly OAB due to overhydration. Will have her cut back on water in the evening. Due to symptoms and duration will send culture.  - Culture, Urine; Future  Darleene Shape, NP  I personally spent a total of 31 minutes in the care of the patient today including preparing to see the patient, getting/reviewing separately obtained history, performing a medically appropriate exam/evaluation, placing orders, documenting clinical information in the EHR, and  listening .

## 2023-11-04 LAB — URINE CULTURE
MICRO NUMBER:: 17073066
Result:: NO GROWTH
SPECIMEN QUALITY:: ADEQUATE

## 2023-11-05 ENCOUNTER — Ambulatory Visit: Payer: Self-pay | Admitting: Adult Health

## 2023-12-06 ENCOUNTER — Other Ambulatory Visit: Payer: Self-pay | Admitting: Family Medicine

## 2023-12-06 DIAGNOSIS — I10 Essential (primary) hypertension: Secondary | ICD-10-CM

## 2024-01-11 ENCOUNTER — Other Ambulatory Visit: Payer: Self-pay | Admitting: Family Medicine

## 2024-01-11 DIAGNOSIS — I10 Essential (primary) hypertension: Secondary | ICD-10-CM

## 2024-01-21 ENCOUNTER — Other Ambulatory Visit: Payer: Self-pay | Admitting: Family Medicine

## 2024-01-21 DIAGNOSIS — F17201 Nicotine dependence, unspecified, in remission: Secondary | ICD-10-CM

## 2024-02-08 ENCOUNTER — Other Ambulatory Visit: Payer: Self-pay | Admitting: Family Medicine

## 2024-02-08 DIAGNOSIS — I10 Essential (primary) hypertension: Secondary | ICD-10-CM

## 2024-02-25 ENCOUNTER — Other Ambulatory Visit: Payer: Self-pay | Admitting: Family Medicine

## 2024-02-25 NOTE — Telephone Encounter (Signed)
 Please call patient  and have her schedule a visit -- it has been almost one year since she was last seen by me. Then ok to refill her medication until her appointment date. Pt does not use my chart

## 2024-02-25 NOTE — Telephone Encounter (Signed)
 Left a detailed message with the information below at the patient's cell number.

## 2024-09-20 ENCOUNTER — Ambulatory Visit
